# Patient Record
Sex: Female | Born: 1945
Health system: Southern US, Community
[De-identification: ages and names within clinical notes are randomized; demographics above are authoritative.]

## PROBLEM LIST (undated history)

## (undated) DIAGNOSIS — R600 Localized edema: Secondary | ICD-10-CM

## (undated) DIAGNOSIS — I10 Essential (primary) hypertension: Secondary | ICD-10-CM

## (undated) DIAGNOSIS — E785 Hyperlipidemia, unspecified: Secondary | ICD-10-CM

## (undated) DIAGNOSIS — E119 Type 2 diabetes mellitus without complications: Secondary | ICD-10-CM

## (undated) DIAGNOSIS — E0789 Other specified disorders of thyroid: Secondary | ICD-10-CM

## (undated) HISTORY — DX: Type 2 diabetes mellitus without complications: E11.9

## (undated) HISTORY — PX: ABDOMINAL HYSTERECTOMY: SHX81

## (undated) HISTORY — PX: THYROID SURGERY: SHX805

## (undated) HISTORY — DX: Other specified disorders of thyroid: E07.89

## (undated) HISTORY — DX: Hyperlipidemia, unspecified: E78.5

## (undated) HISTORY — DX: Essential (primary) hypertension: I10

## (undated) HISTORY — DX: Localized edema: R60.0

---

## 1998-12-19 ENCOUNTER — Other Ambulatory Visit: Admission: RE | Admit: 1998-12-19 | Discharge: 1998-12-19 | Payer: Self-pay | Admitting: *Deleted

## 1999-08-31 ENCOUNTER — Ambulatory Visit (HOSPITAL_COMMUNITY): Admission: RE | Admit: 1999-08-31 | Discharge: 1999-08-31 | Payer: Self-pay | Admitting: Gastroenterology

## 1999-10-02 ENCOUNTER — Ambulatory Visit (HOSPITAL_COMMUNITY): Admission: RE | Admit: 1999-10-02 | Discharge: 1999-10-02 | Payer: Self-pay | Admitting: Gastroenterology

## 1999-10-02 ENCOUNTER — Encounter: Payer: Self-pay | Admitting: Gastroenterology

## 2000-02-23 ENCOUNTER — Other Ambulatory Visit: Admission: RE | Admit: 2000-02-23 | Discharge: 2000-02-23 | Payer: Self-pay | Admitting: *Deleted

## 2001-02-27 ENCOUNTER — Other Ambulatory Visit: Admission: RE | Admit: 2001-02-27 | Discharge: 2001-02-27 | Payer: Self-pay | Admitting: *Deleted

## 2002-03-02 ENCOUNTER — Other Ambulatory Visit: Admission: RE | Admit: 2002-03-02 | Discharge: 2002-03-02 | Payer: Self-pay | Admitting: *Deleted

## 2002-05-15 ENCOUNTER — Ambulatory Visit (HOSPITAL_BASED_OUTPATIENT_CLINIC_OR_DEPARTMENT_OTHER): Admission: RE | Admit: 2002-05-15 | Discharge: 2002-05-15 | Payer: Self-pay | Admitting: Orthopedic Surgery

## 2003-01-12 ENCOUNTER — Emergency Department (HOSPITAL_COMMUNITY): Admission: EM | Admit: 2003-01-12 | Discharge: 2003-01-12 | Payer: Self-pay | Admitting: Emergency Medicine

## 2003-01-12 ENCOUNTER — Encounter: Payer: Self-pay | Admitting: Emergency Medicine

## 2003-03-24 ENCOUNTER — Other Ambulatory Visit: Admission: RE | Admit: 2003-03-24 | Discharge: 2003-03-24 | Payer: Self-pay | Admitting: *Deleted

## 2003-04-01 ENCOUNTER — Encounter: Payer: Self-pay | Admitting: Emergency Medicine

## 2003-04-02 ENCOUNTER — Encounter: Payer: Self-pay | Admitting: Internal Medicine

## 2003-04-02 ENCOUNTER — Inpatient Hospital Stay (HOSPITAL_COMMUNITY): Admission: EM | Admit: 2003-04-02 | Discharge: 2003-04-04 | Payer: Self-pay | Admitting: Emergency Medicine

## 2003-04-28 ENCOUNTER — Ambulatory Visit (HOSPITAL_COMMUNITY): Admission: RE | Admit: 2003-04-28 | Discharge: 2003-04-28 | Payer: Self-pay | Admitting: Family Medicine

## 2003-04-28 ENCOUNTER — Encounter: Payer: Self-pay | Admitting: Family Medicine

## 2004-04-12 ENCOUNTER — Other Ambulatory Visit: Admission: RE | Admit: 2004-04-12 | Discharge: 2004-04-12 | Payer: Self-pay | Admitting: *Deleted

## 2005-04-17 ENCOUNTER — Other Ambulatory Visit: Admission: RE | Admit: 2005-04-17 | Discharge: 2005-04-17 | Payer: Self-pay | Admitting: *Deleted

## 2006-04-19 ENCOUNTER — Other Ambulatory Visit: Admission: RE | Admit: 2006-04-19 | Discharge: 2006-04-19 | Payer: Self-pay | Admitting: Obstetrics and Gynecology

## 2007-04-22 ENCOUNTER — Other Ambulatory Visit: Admission: RE | Admit: 2007-04-22 | Discharge: 2007-04-22 | Payer: Self-pay | Admitting: Obstetrics and Gynecology

## 2007-09-23 ENCOUNTER — Encounter: Payer: Self-pay | Admitting: Obstetrics and Gynecology

## 2007-09-23 ENCOUNTER — Ambulatory Visit (HOSPITAL_COMMUNITY): Admission: RE | Admit: 2007-09-23 | Discharge: 2007-09-24 | Payer: Self-pay | Admitting: Obstetrics and Gynecology

## 2008-07-09 ENCOUNTER — Emergency Department (HOSPITAL_COMMUNITY): Admission: EM | Admit: 2008-07-09 | Discharge: 2008-07-09 | Payer: Self-pay | Admitting: Emergency Medicine

## 2011-01-23 NOTE — Op Note (Signed)
Samantha Lozano, VARAS              ACCOUNT NO.:  [id number]   MEDICAL RECORD NO.:  [id number]          PATIENT TYPE:  OIB   LOCATION:  1530                         FACILITY:  [facility]   PHYSICIAN:  Cynthia P. Romine, M.D.DATE OF BIRTH:  [birth year]   DATE OF PROCEDURE:  09/23/2007  DATE OF DISCHARGE:  09/24/2007                               OPERATIVE REPORT   PREOPERATIVE DIAGNOSIS:  Menorrhagia with recurrent endometrial simple  hyperplasia without atypia and known intrauterine polyp.   POSTOPERATIVE DIAGNOSIS:  Menorrhagia with recurrent endometrial simple  hyperplasia without atypia and known intrauterine polyp, pathology  pending.   PROCEDURE:  Laparoscopic total abdominal hysterectomy and bilateral  salpingo-oophorectomy with da Vinci robotic assistance   SURGEON:  Cynthia P. Romine, M.D.   ASSISTANT:  Genia Del, M.D.   ANESTHESIA:  General endotracheal.   ESTIMATED BLOOD LOSS:  Minimal.   COMPLICATIONS:  None.   PROCEDURE:  The patient was taken to the operating room and after the  induction of adequate general anesthesia, she was placed in the dorsal  lithotomy position and prepped and draped in the usual fashion.  A  posterior weighted and anterior Sims retractor were placed.  The cervix  was grasped on its anterior lip with a single-tooth tenaculum.  The  uterus sounded to 7 cm.  A 6-cm RUMI uterine manipulator was used and a  co-ring was placed around it and addition to the vaginal occluder, and  this was placed inside the uterine cavity.  The balloon of the  manipulator was then inflated.  Attention was then turned to the  abdomen.  An incision was made just above the umbilicus of approximately  3 cm and was carried down to the fascia.  The fascia was grasped with  Kochers and entered atraumatically.  The underlying peritoneum was  entered atraumatically.  A pursestring suture of 0 Vicryl was placed  around the fascial opening and a Hasson trocar sleeve was  then inserted  into the peritoneal space.  Pneumoperitoneum was created with 2 L of CO2  using the automatic insufflator.  The additional ports were placed  approximately the width of a hand away from the umbilical port on both  the patient's right and the left, slightly lower than the umbilical  port, and then the third and the fourth ports were placed, again about  the width of a hand away from the second port at about the level of the  umbilicus.  Three of these ports were 8 mm, the assistance port on the  patient's right was a 10 mm.  These were inserted under direct  visualization with the laparoscope.  The robot was then docked to the  port in the usual fashion.  The operator then proceeded to the [employer]  console.  In the right hand was the monopolar scissors, in the left hand  was the bipolar fenestrated cautery.  The ureters were identified on  each side.  The infundibulopelvic ligaments on each side were cauterized  and cut with the monopolar scissors.  The anterior leaf of the broad  ligament was then taken down  sharply.  The broad ligament was cauterized  and tied sequentially down to the uterine arteries, which were  skeletonized on each side.  The uterine arteries were cauterized and  then cut on each side.  The co-ring was then seen at the bulge through  the vaginal mucosa and the monopolar scissors were used to cut down onto  the co-ring after being sure that the bladder was well away from the  colpotomy incision.  The scissors were used to make the colpotomy  incision anterior and posteriorly.  Bipolar was used laterally to  control bleeders and the uterus was completely detached from the vagina.  The uterus was delivered into the vagina, removed, sent to pathology,  and the vaginal balloon was insufflated to maintain the  pneumoperitoneum.  The vaginal cuff was then closed with four figure-of-  eight sutures of 0 Vicryl.  Copious irrigation was carried out and the   wound was felt to be hemostatic, as were all the pedicles.  The ureters  were inspected and felt to be free of bleeding.  The robot was then  undocked, the gas was allowed to escape and the abdomen was inspected  for bleeding when the pressure was decreased, and there it was felt to  be hemostatic.  The ports were removed under direct visualization and  then the laparoscope was removed.  The umbilical incision had its fascia  closed by tying the previously-placed pursestring suture.  The other  ports had a subcutaneous stitch placed and then the incisions were  closed with subcuticular 4-0 Vicryl with Dermabond.  The instruments  were removed from vagina and the procedure was terminated.  The patient  tolerated it well and went in satisfactory condition to post anesthesia  recovery.  Sponge, needle and instrument counts were correct x3.      Cynthia P. Romine, M.D.  Electronically Signed     CPR/MEDQ  D:  09/26/2007  T:  09/27/2007  Job:  [id number]

## 2011-01-26 NOTE — Op Note (Addendum)
NAME:  Samantha Lozano, Samantha Lozano                        ACCOUNT NO.:  [id number]   MEDICAL RECORD NO.:  [id number]                   PATIENT TYPE:  AMB   LOCATION:  DSC                                  FACILITY:  [facility]   PHYSICIAN:  Harvie Junior, M.D.                DATE OF BIRTH:  [birth year]   DATE OF PROCEDURE:  DATE OF DISCHARGE:                                 OPERATIVE REPORT   PREOPERATIVE DIAGNOSIS:  Medial meniscal tear.   POSTOPERATIVE DIAGNOSES:  1. Same as above.  2. Grade 3 osteochondral defect in the medial femoral condyle through the     small grade 2 defect lateral femoral condyle.  3. Chondromalacia of the trochlea.   PROCEDURE:  1. Partial posterior medial meniscectomy with corresponding debridement of     the median femoral condyle.  2. Debridement of the lateral femoral condyle.  3. Debridement of the patellofemoral trochlea.   SURGEON:  Dr. Luiz Blare.   ASSISTANT:  ____ QA MARKER: 46 ____.        ANESTHESIA:  General.   BRIEF HISTORY AND PHYSICAL:  She is 56 with a long history of having had a  painful knee.  She ultimately had been seen by an outside physician who had  injected her knee.  She had some relief but not complete.  She presented to  [name] with a 30-degree flexion contracture and essentially a locked knee with  severe pain.  Because of the amount of pain she was having and the fact that  she had already had conservative care with failure, we discussed treatment  options and ultimately felt that operative arthroscopy is going to be the  most preferable course of action.  She was brought to the operating room for  this.   PROCEDURE:  The patient was brought to the operating room.  After adequate  anesthesia was obtained, the patient was placed on the operating table.  The  left lateral knee was prepped and draped in the appropriate fashion.  ____                                                          QA MARKER: 80____ examination of the knee  revealed there was an obvious    defect in the medial femoral condyle.  This was a grade 3 defect.  It was  covering about 3 sq cm.  This was debrided back to a smooth and stable rim.  Attention was turned to the medial meniscus.  There was small tear in the  posterior horn of the medial meniscus, this was debrided back to a smooth  and stable rim.  Attention was then turned to the lateral side which was  noted to have  a small, less than 1 cm area of grade 2 change.  This was  debrided with a suction shaver.  Attention was turned to the patellofemoral  joint, where there was some grade 2 and 3 change in the trochlea.  This was  debrided with a suction shaver back to a smooth and stable rim.  Attention  was then turned back to the medial side to make sure that there was no other  loose and fragmented pieces of the articular cartilage.  The knee was  copiously irrigated and suctioned dry.  The arthroscopy portals were closed  with bandages.  A sterile compressive dressing was applied.  The patient was  then taken to the recovery room in satisfactory condition.  Estimated blood  loss for the procedure was none.                                               Harvie Junior, M.D.    Ranae Plumber  D:  05/15/2002  T:  05/17/2002  Job:  [id number]   cc:   Harvie Junior, M.D.  [address]  [place]  [place] [address]  Fax: [phone]

## 2011-01-26 NOTE — Consult Note (Signed)
NAME:  Samantha Lozano, Samantha Lozano                        ACCOUNT NO.:  [id number]   MEDICAL RECORD NO.:  [id number]                   PATIENT TYPE:  INP   LOCATION:  3727                                 FACILITY:  [facility]   PHYSICIAN:  Lesleigh Noe, M.D.            DATE OF BIRTH:  04-02-1946   DATE OF CONSULTATION:  04/02/2003  DATE OF DISCHARGE:                                   CONSULTATION   REFERRING PHYSICIAN:  Dr Lilyan Punt. [place].   REASON FOR EVALUATION:  Loss of consciousness.   CONCLUSIONS:  1. Loss of consciousness, etiology uncertain.  There were no cardiac     abnormalities identified, even though the patient was unconscious for     quite some time.  There was some mention of possible anisocoria but this     cannot be confirmed.  The patient's blood pressure and heart rate, if     anything, were somewhat elevated and no arrhythmias or bradycardia were     identified.  2. Possible hypertension.  3. History of hypothyroidism.   RECOMMENDATION:  1. No further specific cardiac evaluation.  Serial enzymes have already been     done and are negative.  2. Serial EKGs have not demonstrated any significant change that would     explain an episode of loss of consciousness or syncope.  She has     developed an incomplete right bundle and has first-degree A-V block, but     again, no hemodynamically compromising arrhythmias were documented at the     time that the patient was unconscious.  3. Obesity could be a clue towards the possibility of pulmonary embolus,     although absence of respiratory distress makes this seem less likely, but     consideration of a spiral CT to rule out pulmonary emboli would be a     consideration, although I think a far stretch.   COMMENTS:  The patient was admitted to the hospital after falling  unconscious at home.  This apparently was witnessed by her husband.  She  initially felt somewhat weak and then noted that there may have been dimming  of  vision, when she communicated to her husband that she felt she needed a  wet rag.  When he got back with the rag, he noted that she was starting to  fall and he helped her to the floor.  She then apparently remained  unconscious for greater than an hour and was even unconscious after EMS  arrived.  All vital signs recorded by EMS were with an adequate heart rate  and initially extremely high systolic and diastolic blood pressures above  200 and above 100, respectively.  Apparently, there was, as mentioned  before, some anisocoria.  In the emergency room, the patient was  unresponsive but had vital signs that supported adequate hemodynamics.  She  apparently only takes Synthroid.  She has a history  of fainting during her  younger years when she would have her menstrual cycles.  She has no history  of heart disease.  She is not diabetic.   ONLY SIGNIFICANT MEDICAL PROBLEMS:  1. Hypothyroidism.  2. Sliding hiatal hernia.  3. Possible hypertension.   HABITS:  Does not smoke or drink.   FAMILY HISTORY:  One brother died suddenly with a myocardial infarction.   PHYSICAL EXAMINATION:  GENERAL:  The patient is obese.  VITAL SIGNS:  Blood pressure 150/70.  Heart rate 96.  O2 saturation on room  air is 95%.  NECK:  Neck exam reveals no JVD, no carotid bruits.  LUNGS:  Lungs are clear to auscultation and percussion.  CARDIAC:  No click.  No rub.  A faint 1/6 systolic murmur is heard.  ABDOMEN:  The abdomen is soft.  Liver edge is not palpable.  Bowel sounds  are normal.  EXTREMITIES:  The extremities reveal no edema.  Pulses are 2+ and symmetric  in the upper and lower extremities.  NEUROLOGIC:  Exam reveals no motor or sensory deficits.   LABORATORY AND ACCESSORY CLINICAL DATA:  EKG has already been outlined above  or shows specifically no acute change.  The second EKG reveals incomplete  right bundle branch block and poor R wave progression.   Laboratory data included three sets of  cardiac enzymes that were negative.   DISCUSSION:  This is a very odd situation.  I do not think that a cardiac  arrhythmia or cardiac event is the source.  Reasonable evaluation has  already been done.  I wonder if this could potentially be neurologic, even  though the CT scan was normal.  Would consider getting a neurology  consultation.  I do not feel any further cardiac evaluation is necessary.                                               Lesleigh Noe, M.D.    HWS/MEDQ  D:  04/02/2003  T:  04/03/2003  Job:  [id number]   cc:   [facility]

## 2011-01-26 NOTE — Discharge Summary (Signed)
NAME:  Samantha Lozano, Samantha Lozano                        ACCOUNT NO.:  [id number]   MEDICAL RECORD NO.:  [id number]                   PATIENT TYPE:  INP   LOCATION:  3727                                 FACILITY:  [facility]   PHYSICIAN:  Lilyan Punt. Sydnee Levans, M.D.             DATE OF BIRTH:  Oct 14, 1945   DATE OF ADMISSION:  04/01/2003  DATE OF DISCHARGE:  04/04/2003                                 DISCHARGE SUMMARY   DIAGNOSES:  1. Paroxysmal loss of consciousness.  2. Hypertensive.  3. Hypothyroidism status post thyroidectomy for cancer in the 80s.  4. Dysfunctional uterine bleeding, scheduled for biopsy by Dr. Clelia Croft.  5. Atherosclerotic cerebrovascular disease, hypertensive related small     vessel disease.   MEDICATIONS:  1. Hydrochlorothiazide 25 mg once daily.  2. Aspirin 325 mg daily.  3. Synthroid 150 mcg daily.  4. She was informed to refrain from restarting Premarin which had been begun     eight days prior to admission.   STUDIES AND FINDINGS:  1. EEG, negative for seizures.  2. Brain MRI, no findings of small vessel CVD.  3. Negative serial cardiac enzymes for acute ischemia.  4. ECG revealed intermittent right bundle branch block.   CONSULTATIONS:  1. Dr. Verdis Prime cardiology April 02, 2003-- the cardiologist did not feel     that a cardiac arrhythmia or a cardiac event was the source.  Speculated     a possible neurologic event, despite normal CT scan.  He recommended     neurology consultation and did not feel that any further cardiac     evaluation would be necessary.  2. Dr. Marcelino Freestone, neurology April 03, 2003--it was recommended the     patient have a TIA workup with MR angiogram of the neck and the arch to     the inferior vessels to rule out vertebral origin stenosis, vertebral     basilar insufficiency, and an echocardiogram to rule out ________.  These     could be done as an outpatient.  She advised the patient not to drive for     three months.  It was  recommended aspirin 325 mg daily.  Impression:     syncope of uncertain etiology with evaluation of MRI and MRA and EEG     yielding to no etiology.  Normal findings at the time of Dr. Bethann Goo     exam.   HISTORY OF PRESENT ILLNESS:  A 65 year old woman with a history of  hypertension, who had been off her medications for about one week.  She was  brought to the ED by the paramedics for a syncopal spell at home that lasted  close to 40 minutes.  He had a Glasgow coma scale of 3 and while in the ED  the staff was attempting to intubate, the patient awakened, was alert and  oriented x3, and not post ictal.  Her admission workup by  Dr. Renford Dills  and was to further assess her for syncope.  It was also felt the  hypertension, being uncontrolled, may have contributed.   HOSPITAL COURSE:  Various studies were conducted.  Cardiac enzymes were  negative.  EKG negative.  MRA, MRI were negative.  Blood pressure gradually  returned to acceptable normotensive levels, having restarted her HCTZ.  The  cardiologist did not do additional tests are warranted.  The patient was  observed and had no seizure, syncopal, or presyncopal events.  Orthostatic  vital signs were checked.  On the fourth hospital day the patient was  discharged home with plans for follow up testing as Dr. Orlin Hilding has advised;  carotid Dopplers, MRA of the neck, and a 2-D echocardiogram.  The patient  preferred to be sent home prior to concluding these studies while  hospitalized and that was felt to be appropriate.                                                Lilyan Punt Sydnee Levans, M.D.    KCS/MEDQ  D:  05/02/2003  T:  05/03/2003  Job:  [id number]   cc:   [facility]   Catherine A. Orlin Hilding, M.D.  [address]  [address]  [place]  [place] [address]  Fax: [phone]

## 2011-01-26 NOTE — Consult Note (Signed)
NAME:  Samantha Lozano, Samantha Lozano                        ACCOUNT NO.:  [id number]   MEDICAL RECORD NO.:  [id number]                   PATIENT TYPE:  INP   LOCATION:  3727                                 FACILITY:  [facility]   PHYSICIAN:  Gustavus Messing. Orlin Hilding, M.D.          DATE OF BIRTH:  1945/12/26   DATE OF CONSULTATION:  04/03/2003  DATE OF DISCHARGE:                                   CONSULTATION   CHIEF COMPLAINT:  Syncope.   HISTORY OF PRESENT ILLNESS:  Ms. Karim is a 65 year old right-handed,  white woman with a history of hypertension, obesity and hypothyroidism who  was admitted on April 01, 2003 after a syncopal episode at home.  The patient  says she was fine, in her usual state of health, going about her business  and she had a brief onset of lightheadedness.  She felt she was going to  faint, told her husband to get her a wet cloth and then just passed out on  the ground.  There was no prodrome or aura other than the slight  lightheadedness.  She had no palpitations, no odd olfactory sensations or  anything suggestive of complex partial seizure.  She was apparently  completely unresponsive when EMS arrived with quite elevated blood pressure  of 216/186 and a Glasgow Coma Scale score of 3 and intubation was attempted  for airway protection when she woke up and then was quickly oriented and  appropriate.   REVIEW OF SYSTEMS:  Negative for headache, visual changes, numbness,  weakness, incoordination, chest pain or palpitations.  She has had no  previous history of syncopal spells or anything suggestive of seizure.   PAST MEDICAL HISTORY:  1. Significant for obesity.  2. Hypertension.  3. Hypothyroidism, thyroidectomy.  4. Remote left knee surgery.   MEDICATIONS:  Hydrochlorothiazide, Lovenox, Synthroid, Tylenol and  Phenergan.   ALLERGIES:  No known drug allergies.   SOCIAL HISTORY:  She is married, works as a Arboriculturist.  Denies alcohol,  cigarette or drug use.   FAMILY  HISTORY:  Negative for seizure or stroke.   PHYSICAL EXAMINATION:  VITAL SIGNS:  Temperature is 97.5, pulse 85, blood  pressure 105/55, respirations 18, 94% sat on room air.  HEENT:  Head is normocephalic, atraumatic.  NECK:  Supple without bruits.  HEART:  Regular, rate and rhythm without murmurs.  She is obese.  NEUROLOGIC:  Mental status:  She is awake, alert and appropriate with  normal, fluent and spontaneous language.  She names, repeats and  comprehends.  I did not do a formal mini mental status exam as far as memory  features is concerned.  Cranial nerve II:  Pupils are equal and reactive.  Visual fields are full to confrontation.  III, IV and VI:  Extraocular  movements are intact without nystagmus, ophthalmoparesis or ptosis.  V:  Facial sensation is normal in all divisions bilaterally.  VII:  Facial motor  activity is intact  without weakness, droop or asymmetry.  VIII:  Hearing is  intact to voice and finger scratch.  IX,  X and XII:  Palate is symmetric  and tongue is midline.  XI:  She has a normal shoulder shrug.  On motor exam  she has normal station and gait.  She has normal bulk, tone, and strength  throughout with 5/5 strength in all four extremities with normal rapid fine  movements.  No drift or satellite.  No fasciculations, atrophy or tremor  with the exception of a very fine, very faint head titubation which may be  incipient by an essential tremor.  Deep tendon reflexes:  I could not elicit  them easily because of her body habitus.  I was able to get a right knee  jerk and a right brachioradialis but could not get other deep tendon  reflexes.  She had downgoing toes on plantar stimulation.  Coordination:  Finger-to-nose and rapid alternating movements, heel-to-shin and tandem gait  are normal.  Sensory exam is intact to light touch and pin prick.   I have reviewed the MRI scan of her brain.  I think the MRI is normal.  It  was read as atrophy and small vessel  disease.  There is minimal increase in  CSF space and rare miniscule nonspecific white matter dots in the cerebrum,  nothing acute.   MR angiogram, likewise, was read as showing some atherosclerosis but I think  it is essentially normal.   EEG is normal in the awake state.   IMPRESSION:  Syncope.  The etiology is uncertain.  By MRI, MRA and EEG there  is no evidence of seizure or transient ischemic attack and apparently by  cardiac evaluation and monitoring there is no evidence of an arrhythmia at  the present.  However, none of these possible etiologies are excluded  because of normal findings at this time.   RECOMMENDATIONS:  Would complete the TIA workup with a MR angiogram of the  neck from arch to intercranial vessels to rule out vertebral origin  stenosis, vertebrobasilar insufficiency and would get an echocardiogram to  rule out cardiac clot.  Would advise the patient not to drive for three  months pending further events as, if this was a seizure, the highest  incidents of recurrence is within the first three months.  Would defer to  the cardiologist's recommendations regarding the likelihood of recurrent  arrhythmias and their timeframe.  This workup could be done either inpatient  or outpatient.  I would at least begin her on aspirin 325 mg once a day.  I  have discussed this with the family and with Dr. Sydnee Levans.                                               Catherine A. Orlin Hilding, M.D.    CAW/MEDQ  D:  04/03/2003  T:  04/04/2003  Job:  [id number]

## 2011-05-31 LAB — COMPREHENSIVE METABOLIC PANEL
Calcium: 9.3
GFR calc Af Amer: >60
Potassium: 3.6
Total Bilirubin: 0.6

## 2011-05-31 LAB — CBC
HCT: 31.3 — ABNORMAL LOW
HCT: 34.8 — ABNORMAL LOW
Hemoglobin: 10.8 — ABNORMAL LOW
Platelets: 208
Platelets: 227
RDW: 12.8
WBC: 7.9

## 2013-06-11 ENCOUNTER — Other Ambulatory Visit (HOSPITAL_BASED_OUTPATIENT_CLINIC_OR_DEPARTMENT_OTHER): Payer: Self-pay | Admitting: Family Medicine

## 2013-06-11 DIAGNOSIS — E041 Nontoxic single thyroid nodule: Secondary | ICD-10-CM

## 2013-06-15 ENCOUNTER — Ambulatory Visit (HOSPITAL_BASED_OUTPATIENT_CLINIC_OR_DEPARTMENT_OTHER)
Admission: RE | Admit: 2013-06-15 | Discharge: 2013-06-15 | Disposition: A | Discharge status: Home / Self Care | Payer: Medicare PPO | Source: Ambulatory Visit | Attending: Family Medicine | Admitting: Family Medicine

## 2013-06-15 DIAGNOSIS — E041 Nontoxic single thyroid nodule: Secondary | ICD-10-CM | POA: Insufficient documentation

## 2013-06-17 ENCOUNTER — Other Ambulatory Visit: Payer: Self-pay | Admitting: Family Medicine

## 2013-06-17 DIAGNOSIS — E041 Nontoxic single thyroid nodule: Secondary | ICD-10-CM

## 2013-06-23 ENCOUNTER — Other Ambulatory Visit (HOSPITAL_COMMUNITY)
Admission: RE | Admit: 2013-06-23 | Discharge: 2013-06-23 | Disposition: A | Discharge status: Home / Self Care | Payer: Medicare PPO | Source: Ambulatory Visit | Attending: Interventional Radiology | Admitting: Interventional Radiology

## 2013-06-23 ENCOUNTER — Ambulatory Visit
Admission: RE | Admit: 2013-06-23 | Discharge: 2013-06-23 | Disposition: A | Discharge status: Home / Self Care | Payer: Medicare PPO | Source: Ambulatory Visit | Attending: Family Medicine | Admitting: Family Medicine

## 2013-06-23 DIAGNOSIS — E041 Nontoxic single thyroid nodule: Secondary | ICD-10-CM

## 2016-01-18 DIAGNOSIS — M25562 Pain in left knee: Secondary | ICD-10-CM | POA: Diagnosis not present

## 2016-01-18 DIAGNOSIS — E039 Hypothyroidism, unspecified: Secondary | ICD-10-CM | POA: Diagnosis not present

## 2016-01-18 DIAGNOSIS — Z0000 Encounter for general adult medical examination without abnormal findings: Secondary | ICD-10-CM | POA: Diagnosis not present

## 2016-01-18 DIAGNOSIS — E669 Obesity, unspecified: Secondary | ICD-10-CM | POA: Diagnosis not present

## 2016-01-18 DIAGNOSIS — E119 Type 2 diabetes mellitus without complications: Secondary | ICD-10-CM | POA: Diagnosis not present

## 2016-01-18 DIAGNOSIS — I10 Essential (primary) hypertension: Secondary | ICD-10-CM | POA: Diagnosis not present

## 2016-01-18 DIAGNOSIS — E782 Mixed hyperlipidemia: Secondary | ICD-10-CM | POA: Diagnosis not present

## 2016-03-07 DIAGNOSIS — L821 Other seborrheic keratosis: Secondary | ICD-10-CM | POA: Diagnosis not present

## 2016-03-07 DIAGNOSIS — D225 Melanocytic nevi of trunk: Secondary | ICD-10-CM | POA: Diagnosis not present

## 2016-03-07 DIAGNOSIS — L570 Actinic keratosis: Secondary | ICD-10-CM | POA: Diagnosis not present

## 2016-03-07 DIAGNOSIS — D1801 Hemangioma of skin and subcutaneous tissue: Secondary | ICD-10-CM | POA: Diagnosis not present

## 2016-03-07 DIAGNOSIS — D485 Neoplasm of uncertain behavior of skin: Secondary | ICD-10-CM | POA: Diagnosis not present

## 2016-04-25 DIAGNOSIS — M25562 Pain in left knee: Secondary | ICD-10-CM | POA: Diagnosis not present

## 2016-04-25 DIAGNOSIS — M25561 Pain in right knee: Secondary | ICD-10-CM | POA: Diagnosis not present

## 2016-04-27 DIAGNOSIS — Z1231 Encounter for screening mammogram for malignant neoplasm of breast: Secondary | ICD-10-CM | POA: Diagnosis not present

## 2016-06-06 DIAGNOSIS — H25813 Combined forms of age-related cataract, bilateral: Secondary | ICD-10-CM | POA: Diagnosis not present

## 2016-06-06 DIAGNOSIS — E119 Type 2 diabetes mellitus without complications: Secondary | ICD-10-CM | POA: Diagnosis not present

## 2016-06-14 DIAGNOSIS — Z23 Encounter for immunization: Secondary | ICD-10-CM | POA: Diagnosis not present

## 2016-07-20 DIAGNOSIS — E039 Hypothyroidism, unspecified: Secondary | ICD-10-CM | POA: Diagnosis not present

## 2016-07-20 DIAGNOSIS — Z0000 Encounter for general adult medical examination without abnormal findings: Secondary | ICD-10-CM | POA: Diagnosis not present

## 2016-07-20 DIAGNOSIS — E669 Obesity, unspecified: Secondary | ICD-10-CM | POA: Diagnosis not present

## 2016-07-20 DIAGNOSIS — I10 Essential (primary) hypertension: Secondary | ICD-10-CM | POA: Diagnosis not present

## 2016-07-20 DIAGNOSIS — E119 Type 2 diabetes mellitus without complications: Secondary | ICD-10-CM | POA: Diagnosis not present

## 2016-07-20 DIAGNOSIS — E782 Mixed hyperlipidemia: Secondary | ICD-10-CM | POA: Diagnosis not present

## 2016-07-20 DIAGNOSIS — M25562 Pain in left knee: Secondary | ICD-10-CM | POA: Diagnosis not present

## 2016-07-25 DIAGNOSIS — E782 Mixed hyperlipidemia: Secondary | ICD-10-CM | POA: Diagnosis not present

## 2016-07-25 DIAGNOSIS — Z23 Encounter for immunization: Secondary | ICD-10-CM | POA: Diagnosis not present

## 2016-07-25 DIAGNOSIS — I10 Essential (primary) hypertension: Secondary | ICD-10-CM | POA: Diagnosis not present

## 2016-07-25 DIAGNOSIS — E669 Obesity, unspecified: Secondary | ICD-10-CM | POA: Diagnosis not present

## 2016-07-25 DIAGNOSIS — E039 Hypothyroidism, unspecified: Secondary | ICD-10-CM | POA: Diagnosis not present

## 2016-07-25 DIAGNOSIS — Z7984 Long term (current) use of oral hypoglycemic drugs: Secondary | ICD-10-CM | POA: Diagnosis not present

## 2016-07-25 DIAGNOSIS — E119 Type 2 diabetes mellitus without complications: Secondary | ICD-10-CM | POA: Diagnosis not present

## 2016-11-22 DIAGNOSIS — L570 Actinic keratosis: Secondary | ICD-10-CM | POA: Diagnosis not present

## 2016-11-22 DIAGNOSIS — D225 Melanocytic nevi of trunk: Secondary | ICD-10-CM | POA: Diagnosis not present

## 2016-11-22 DIAGNOSIS — D485 Neoplasm of uncertain behavior of skin: Secondary | ICD-10-CM | POA: Diagnosis not present

## 2017-01-03 DIAGNOSIS — L820 Inflamed seborrheic keratosis: Secondary | ICD-10-CM | POA: Diagnosis not present

## 2017-01-03 DIAGNOSIS — L720 Epidermal cyst: Secondary | ICD-10-CM | POA: Diagnosis not present

## 2017-01-03 DIAGNOSIS — L570 Actinic keratosis: Secondary | ICD-10-CM | POA: Diagnosis not present

## 2017-01-23 DIAGNOSIS — I10 Essential (primary) hypertension: Secondary | ICD-10-CM | POA: Diagnosis not present

## 2017-01-23 DIAGNOSIS — E039 Hypothyroidism, unspecified: Secondary | ICD-10-CM | POA: Diagnosis not present

## 2017-01-23 DIAGNOSIS — Z0000 Encounter for general adult medical examination without abnormal findings: Secondary | ICD-10-CM | POA: Diagnosis not present

## 2017-01-23 DIAGNOSIS — E782 Mixed hyperlipidemia: Secondary | ICD-10-CM | POA: Diagnosis not present

## 2017-01-23 DIAGNOSIS — Z6841 Body Mass Index (BMI) 40.0 and over, adult: Secondary | ICD-10-CM | POA: Diagnosis not present

## 2017-01-23 DIAGNOSIS — Z7984 Long term (current) use of oral hypoglycemic drugs: Secondary | ICD-10-CM | POA: Diagnosis not present

## 2017-01-23 DIAGNOSIS — E119 Type 2 diabetes mellitus without complications: Secondary | ICD-10-CM | POA: Diagnosis not present

## 2017-01-23 DIAGNOSIS — Z1159 Encounter for screening for other viral diseases: Secondary | ICD-10-CM | POA: Diagnosis not present

## 2017-04-29 DIAGNOSIS — Z1231 Encounter for screening mammogram for malignant neoplasm of breast: Secondary | ICD-10-CM | POA: Diagnosis not present

## 2017-06-17 DIAGNOSIS — Z23 Encounter for immunization: Secondary | ICD-10-CM | POA: Diagnosis not present

## 2017-07-02 DIAGNOSIS — R6889 Other general symptoms and signs: Secondary | ICD-10-CM | POA: Diagnosis not present

## 2017-07-02 DIAGNOSIS — J0110 Acute frontal sinusitis, unspecified: Secondary | ICD-10-CM | POA: Diagnosis not present

## 2017-07-30 DIAGNOSIS — E039 Hypothyroidism, unspecified: Secondary | ICD-10-CM | POA: Diagnosis not present

## 2017-07-30 DIAGNOSIS — I10 Essential (primary) hypertension: Secondary | ICD-10-CM | POA: Diagnosis not present

## 2017-07-30 DIAGNOSIS — E782 Mixed hyperlipidemia: Secondary | ICD-10-CM | POA: Diagnosis not present

## 2017-07-30 DIAGNOSIS — E119 Type 2 diabetes mellitus without complications: Secondary | ICD-10-CM | POA: Diagnosis not present

## 2017-11-27 DIAGNOSIS — L720 Epidermal cyst: Secondary | ICD-10-CM | POA: Diagnosis not present

## 2017-11-27 DIAGNOSIS — L570 Actinic keratosis: Secondary | ICD-10-CM | POA: Diagnosis not present

## 2017-12-16 DIAGNOSIS — L723 Sebaceous cyst: Secondary | ICD-10-CM | POA: Diagnosis not present

## 2017-12-16 DIAGNOSIS — J069 Acute upper respiratory infection, unspecified: Secondary | ICD-10-CM | POA: Diagnosis not present

## 2017-12-16 DIAGNOSIS — R05 Cough: Secondary | ICD-10-CM | POA: Diagnosis not present

## 2017-12-16 DIAGNOSIS — L089 Local infection of the skin and subcutaneous tissue, unspecified: Secondary | ICD-10-CM | POA: Diagnosis not present

## 2017-12-17 DIAGNOSIS — J22 Unspecified acute lower respiratory infection: Secondary | ICD-10-CM | POA: Diagnosis not present

## 2017-12-17 DIAGNOSIS — L02213 Cutaneous abscess of chest wall: Secondary | ICD-10-CM | POA: Diagnosis not present

## 2017-12-17 DIAGNOSIS — R0602 Shortness of breath: Secondary | ICD-10-CM | POA: Diagnosis not present

## 2017-12-19 DIAGNOSIS — J22 Unspecified acute lower respiratory infection: Secondary | ICD-10-CM | POA: Diagnosis not present

## 2017-12-20 DIAGNOSIS — L720 Epidermal cyst: Secondary | ICD-10-CM | POA: Diagnosis not present

## 2018-01-01 DIAGNOSIS — L720 Epidermal cyst: Secondary | ICD-10-CM | POA: Diagnosis not present

## 2018-01-14 DIAGNOSIS — L720 Epidermal cyst: Secondary | ICD-10-CM | POA: Diagnosis not present

## 2018-01-28 DIAGNOSIS — R05 Cough: Secondary | ICD-10-CM | POA: Diagnosis not present

## 2018-01-28 DIAGNOSIS — E1159 Type 2 diabetes mellitus with other circulatory complications: Secondary | ICD-10-CM | POA: Diagnosis not present

## 2018-01-28 DIAGNOSIS — E782 Mixed hyperlipidemia: Secondary | ICD-10-CM | POA: Diagnosis not present

## 2018-01-28 DIAGNOSIS — E039 Hypothyroidism, unspecified: Secondary | ICD-10-CM | POA: Diagnosis not present

## 2018-01-28 DIAGNOSIS — I10 Essential (primary) hypertension: Secondary | ICD-10-CM | POA: Diagnosis not present

## 2018-02-25 DIAGNOSIS — B029 Zoster without complications: Secondary | ICD-10-CM | POA: Diagnosis not present

## 2018-03-06 ENCOUNTER — Encounter (INDEPENDENT_AMBULATORY_CARE_PROVIDER_SITE_OTHER): Payer: Medicare PPO

## 2018-03-10 ENCOUNTER — Encounter (INDEPENDENT_AMBULATORY_CARE_PROVIDER_SITE_OTHER): Payer: Self-pay | Admitting: Family Medicine

## 2018-03-10 ENCOUNTER — Ambulatory Visit (INDEPENDENT_AMBULATORY_CARE_PROVIDER_SITE_OTHER): Payer: PPO | Admitting: Family Medicine

## 2018-03-10 VITALS — BP 146/79 | HR 62 | Temp 98.1°F | Ht 62.0 in | Wt 237.0 lb

## 2018-03-10 DIAGNOSIS — Z6841 Body Mass Index (BMI) 40.0 and over, adult: Secondary | ICD-10-CM

## 2018-03-10 DIAGNOSIS — Z1331 Encounter for screening for depression: Secondary | ICD-10-CM

## 2018-03-10 DIAGNOSIS — E538 Deficiency of other specified B group vitamins: Secondary | ICD-10-CM | POA: Diagnosis not present

## 2018-03-10 DIAGNOSIS — E038 Other specified hypothyroidism: Secondary | ICD-10-CM | POA: Diagnosis not present

## 2018-03-10 DIAGNOSIS — E559 Vitamin D deficiency, unspecified: Secondary | ICD-10-CM | POA: Insufficient documentation

## 2018-03-10 DIAGNOSIS — R0602 Shortness of breath: Secondary | ICD-10-CM

## 2018-03-10 DIAGNOSIS — R5383 Other fatigue: Secondary | ICD-10-CM | POA: Diagnosis not present

## 2018-03-10 DIAGNOSIS — E119 Type 2 diabetes mellitus without complications: Secondary | ICD-10-CM | POA: Diagnosis not present

## 2018-03-10 DIAGNOSIS — Z0289 Encounter for other administrative examinations: Secondary | ICD-10-CM

## 2018-03-10 DIAGNOSIS — R9431 Abnormal electrocardiogram [ECG] [EKG]: Secondary | ICD-10-CM

## 2018-03-10 DIAGNOSIS — Z794 Long term (current) use of insulin: Secondary | ICD-10-CM | POA: Insufficient documentation

## 2018-03-10 MED ORDER — GLUCOSE BLOOD VI STRP: ORAL_STRIP | 0 refills | Status: DC

## 2018-03-10 NOTE — Progress Notes (Signed)
.  Office: [phone]  /  Fax: [phone]   HPI:   Chief Complaint: OBESITY  Samantha Lozano (MR# [id number]) is a 72 y.o. female who presents on 03/10/2018 for obesity evaluation and treatment. Current BMI is Body mass index is 43.35 kg/m.Samantha Lozano has struggled with obesity for years and has been unsuccessful in either losing weight or maintaining long term weight loss. [name] attended our information session and states she is currently in the action stage of change and ready to dedicate time achieving and maintaining a healthier weight.  [name] states her desired weight is under 200 lbs she started gaining weight after having a baby she has significant food cravings issues  she skips meals frequently she is frequently drinking liquids with calories she frequently makes poor food choices she frequently eats larger portions than normal  she has binge eating behaviors she struggles with emotional eating    Fatigue Samantha Lozano feels her energy is lower than it should be. This has worsened with weight gain and has not worsened recently. [name] admits to daytime somnolence and  denies waking up still tired. Patient is at risk for obstructive sleep apnea. Patent has a history of symptoms of daytime fatigue. Patient generally gets 8 hours of sleep per night, and states they generally have  restful sleep. Snoring is present. Apneic episodes are not present. Epworth Sleepiness Score is 8  Dyspnea on exertion Samantha Lozano notes increasing shortness of breath with exercising and seems to be worsening over time with weight gain. She notes getting out of breath sooner with activity than she used to. This has not gotten worse recently. Samantha Lozano denies orthopnea.  Diabetes II [name] has a diagnosis of diabetes type II. Samantha Lozano checks blood sugar occasionally and states BGs ranged between 158 and 226 in the last month. She is on metformin and she wants to change her diet to avoid having to start insulin. She  admits polyphagia and denies hypoglycemia. She is attempting to work on intensive lifestyle modifications including diet, exercise, and weight loss to help control her blood glucose levels.  Abnormal EKG EKG was ordered today and is showing LVH. Samantha Lozano admits shortness of breath with activity. She has a diagnosis of hypertension and she has uncontrolled diabetes. Samantha Lozano denies chest pain.  Vitamin D deficiency Samantha Lozano has a diagnosis of vitamin D deficiency. There are no recent labs in Epic. She is not currently taking vit D. Samantha Lozano admits fatigue and denies nausea, vomiting or muscle weakness.  Vitamin B12 deficiency Samantha Lozano is on metformin, so she is at high risk of B12 insufficiency. This is a new diagnosis. [name] is not a vegetarian and does not have a previous diagnosis of pernicious anemia. She does not have a history of weight loss surgery.   Hypothyroid Samantha Lozano has a diagnosis of hypothyroidism. She is status post resection due to thyroid cancer. She is on levothyroxine. She denies hot or cold intolerance or palpitations, but does admit to ongoing fatigue.  Depression Screen Samantha Lozano's Food and Mood (modified PHQ-9) score was  Depression screen PHQ 2/9 03/10/2018  Decreased Interest 0  Down, Depressed, Hopeless 1  PHQ - 2 Score 1  Altered sleeping 0  Tired, decreased energy 1  Change in appetite 0  Feeling bad or failure about yourself  1  Trouble concentrating 0  Moving slowly or fidgety/restless 0  Suicidal thoughts 0  PHQ-9 Score 3  Difficult doing work/chores Not difficult at all    ALLERGIES: Allergies  Allergen Reactions  . Lisinopril  Cough    MEDICATIONS: Current Outpatient Medications on File Prior to Visit  Medication Sig Dispense Refill  . aspirin EC 81 MG tablet Take 81 mg by mouth daily.    . carvedilol (COREG) 25 MG tablet Take 25 mg by mouth 2 (two) times daily with a meal.    . hydrochlorothiazide (HYDRODIURIL) 25 MG tablet Take 25 mg by mouth daily.    Samantha Kitchen  levothyroxine (SYNTHROID, LEVOTHROID) 125 MCG tablet Take 125 mcg by mouth daily before breakfast.    . lovastatin (MEVACOR) 40 MG tablet Take 40 mg by mouth at bedtime.    . metFORMIN (GLUCOPHAGE-XR) 750 MG 24 hr tablet Take 750 mg by mouth daily with breakfast.    . Omega-3 Fatty Acids (FISH OIL) 435 MG CAPS Take 1 capsule by mouth daily at 2 PM.     No current facility-administered medications on file prior to visit.     PAST MEDICAL HISTORY: Past Medical History:  Diagnosis Date  . Diabetes mellitus, type 2 ([place])   . HLD (hyperlipidemia)   . HTN (hypertension)   . Leg edema   . Other specified disorders of thyroid     PAST SURGICAL HISTORY: Past Surgical History:  Procedure Laterality Date  . ABDOMINAL HYSTERECTOMY    . THYROID SURGERY      SOCIAL HISTORY: Social History   Tobacco Use  . Smoking status: Never Smoker  . Smokeless tobacco: Never Used  Substance Use Topics  . Alcohol use: Not on file  . Drug use: Not on file    FAMILY HISTORY: Family History  Problem Relation Age of Onset  . Diabetes Mother   . Hypertension Mother   . Cancer Mother   . Obesity Mother   . Hypertension Father   . Cancer Father   . Obesity Father     ROS: Review of Systems  Constitutional: Positive for malaise/fatigue.  HENT: Positive for tinnitus.   Eyes:       Vision Changes Floaters  Respiratory: Positive for shortness of breath (on exertion).   Cardiovascular: Negative for chest pain, palpitations and orthopnea.  Gastrointestinal: Negative for nausea and vomiting.  Musculoskeletal:       Negative for muscle weakness  Endo/Heme/Allergies:       Negative for Heat or Cold Intolerance Positive for hyperglycemia Negative for hypoglycemia    PHYSICAL EXAM: Blood pressure (!) 146/79, pulse 62, temperature 98.1 F (36.7 C), temperature source Oral, height _0  (1.575 m), weight 237 lb (107.5 kg), SpO2 98 %. Body mass index is 43.35 kg/m. Physical Exam    Constitutional: She is oriented to person, place, and time. She appears well-developed and well-nourished.  HENT:  Head: Normocephalic and atraumatic.  Nose: Nose normal.  Eyes: EOM are normal. No scleral icterus.  Neck: Normal range of motion. Neck supple.  Cardiovascular: Normal rate and regular rhythm.  Pulmonary/Chest: Effort normal. No respiratory distress.  Abdominal: Soft. There is no tenderness.  + obesity  Musculoskeletal: Normal range of motion.  Range of Motion normal in all 4 extremities  Neurological: She is alert and oriented to person, place, and time. Coordination normal.  Skin: Skin is warm and dry.  Psychiatric: She has a normal mood and affect. Her behavior is normal.  Vitals reviewed.   RECENT LABS AND TESTS: BMET    Component Value Date/Time   NA 141 09/19/2007 1340   K 3.6 09/19/2007 1340   CL 105 09/19/2007 1340   CO2 28 09/19/2007 1340   GLUCOSE  126 (H) 09/19/2007 1340   BUN 14 09/19/2007 1340   CREATININE 0.88 09/19/2007 1340   CALCIUM 9.3 09/19/2007 1340   GFRNONAA >60 09/19/2007 1340   GFRAA  09/19/2007 1340    >60        The eGFR has been calculated using the MDRD equation. This calculation has not been validated in all clinical situations. eGFR's persistently <60 mL/min signify possible Chronic Kidney Disease.   No results found for: HGBA1C No results found for: INSULIN CBC    Component Value Date/Time   WBC 10.3 09/24/2007 0515   RBC 3.89 09/24/2007 0515   HGB 10.8 (L) 09/24/2007 0515   HCT 31.3 (L) 09/24/2007 0515   PLT 208 09/24/2007 0515   MCV 80.3 09/24/2007 0515   MCHC 34.6 09/24/2007 0515   RDW 13.0 09/24/2007 0515   Iron/TIBC/Ferritin/ %Sat No results found for: IRON, TIBC, FERRITIN, IRONPCTSAT Lipid Panel  No results found for: CHOL, TRIG, HDL, CHOLHDL, VLDL, LDLCALC, LDLDIRECT Hepatic Function Panel     Component Value Date/Time   PROT 7.0 09/19/2007 1340   ALBUMIN 3.6 09/19/2007 1340   AST 21 09/19/2007 1340    ALT 19 09/19/2007 1340   ALKPHOS 102 09/19/2007 1340   BILITOT 0.6 09/19/2007 1340   No results found for: TSH Vitamin D There are no recent lab results  ECG  shows NSR with a rate of 61 BPM INDIRECT CALORIMETER done today shows a VO2 of 156 and a REE of 1084. Her calculated basal metabolic rate is 4098 thus her basal metabolic rate is worse than expected.    ASSESSMENT AND PLAN: Other fatigue - Plan: EKG 12-Lead, CBC With Differential, Lipid Panel With LDL/HDL Ratio, T3, T4, free, TSH  Shortness of breath on exertion - Plan: CBC With Differential  Type 2 diabetes mellitus without complication, without long-term current use of insulin (HCC) - Plan: Comprehensive metabolic panel, Hemoglobin A1c, Insulin, random, Microalbumin / creatinine urine ratio, glucose blood test strip  Abnormal EKG - Plan: ECHOCARDIOGRAM COMPLETE  Vitamin D deficiency - Plan: VITAMIN D 25 Hydroxy (Vit-D Deficiency, Fractures)  B12 nutritional deficiency - Plan: Vitamin B12  Other specified hypothyroidism  Depression screening  Class 3 severe obesity with serious comorbidity and body mass index (BMI) of 40.0 to 44.9 in adult, unspecified obesity type (HCC)  PLAN:  Fatigue Mersadies was informed that her fatigue may be related to obesity, depression or many other causes. Labs will be ordered, and in the meanwhile Eh has agreed to work on diet, exercise and weight loss to help with fatigue. Proper sleep hygiene was discussed including the need for 7-8 hours of quality sleep each night. A sleep study was not ordered based on symptoms and Epworth score.  Dyspnea on exertion Bijal's shortness of breath appears to be obesity related and exercise induced. She has agreed to work on weight loss and gradually increase exercise to treat her exercise induced shortness of breath. If Ashton follows our instructions and loses weight without improvement of her shortness of breath, we will plan to refer to pulmonology.  We will monitor this condition regularly. Savannah agrees to this plan.  Diabetes II Lometa has been given extensive diabetes education by myself today including ideal fasting and post-prandial blood glucose readings, individual ideal Hgb A1c goals and hypoglycemia prevention. We discussed the importance of good blood sugar control to decrease the likelihood of diabetic complications such as nephropathy, neuropathy, limb loss, blindness, coronary artery disease, and death. We discussed the importance of intensive  lifestyle modification including diet, exercise and weight loss as the first line treatment for diabetes. We will check labs and Bertina agrees to start diet and check labs two times daily. She will continue her diabetes medications and we will refill One Touch test strips for 1 month. Swan agrees to follow up at the agreed upon time.  Abnormal EKG We will order echocardiogram ([place], no authorization required) and follow. Payslie agrees to follow up as directed.  Vitamin D Deficiency Trenese was informed that low vitamin D levels contributes to fatigue and are associated with obesity, breast, and colon cancer. We will check labs and follow. Eshika will follow up for routine testing of vitamin D, at least 2-3 times per year.  Vitamin B12 deficiency Shaddai will be on a B12 rich diet. B12 supplementation was not prescribed today. We will check labs and follow.  Hypothyroid Laurana was informed of the importance of good thyroid control to help with weight loss efforts. She was also informed that supertheraputic thyroid levels are dangerous and will not improve weight loss results. Emery will continue levothyroxine and we will check labs. Jemeka will follow up with our clinic in 2 weeks.  Depression Screen Oyinkansola had a negative depression screening. Depression is commonly associated with obesity and often results in emotional eating behaviors. We will monitor this closely and  work on CBT to help improve the non-hunger eating patterns. Referral to Psychology may be required if no improvement is seen as she continues in our clinic.  Obesity Earlisha is currently in the action stage of change and her goal is to continue with weight loss efforts She has agreed to follow the Category 1 plan Laurelin has been instructed to work up to a goal of 150 minutes of combined cardio and strengthening exercise per week for weight loss and overall health benefits. We discussed the following Behavioral Modification Strategies today: increasing lean protein intake, decreasing simple carbohydrates  and work on meal planning and easy cooking plans  Aviya has agreed to follow up with our clinic in 2 weeks. She was informed of the importance of frequent follow up visits to maximize her success with intensive lifestyle modifications for her multiple health conditions. She was informed we would discuss her lab results at her next visit unless there is a critical issue that needs to be addressed sooner. Eleonor agreed to keep her next visit at the agreed upon time to discuss these results.    OBESITY BEHAVIORAL INTERVENTION VISIT  Today's visit was # 1 out of 22.  Starting weight: 237 lbs Starting date: 03/10/18 Today's weight : 237 lbs  Today's date: 03/10/2018 Total lbs lost to date: 0 (Patients must lose 7 lbs in the first 6 months to continue with counseling)   ASK: We discussed the diagnosis of obesity with [name] today and Tala agreed to give [name] permission to discuss obesity behavioral modification therapy today.  ASSESS: Maricela has the diagnosis of obesity and her BMI today is 43.34 Naria is in the action stage of change   ADVISE: Jarrod was educated on the multiple health risks of obesity as well as the benefit of weight loss to improve her health. She was advised of the need for long term treatment and the importance of lifestyle modifications.  AGREE: Multiple  dietary modification options and treatment options were discussed and  Shyna agreed to the above obesity treatment plan.   IDoreene Nest, am acting as Location manager for [employer],  MD   I have reviewed the above documentation for accuracy and completeness, and I agree with the above. -Dennard Nip, MD

## 2018-03-11 LAB — TSH: TSH: 1.3 u[IU]/mL (ref 0.450–4.500)

## 2018-03-11 LAB — CBC WITH DIFFERENTIAL
BASOS ABS: 0 10*3/uL (ref 0.0–0.2)
Basos: 0 %
EOS (ABSOLUTE): 0.3 10*3/uL (ref 0.0–0.4)
Eos: 5 %
Hematocrit: 37.6 % (ref 34.0–46.6)
Hemoglobin: 12.1 g/dL (ref 11.1–15.9)
IMMATURE GRANULOCYTES: 0 %
Immature Grans (Abs): 0 10*3/uL (ref 0.0–0.1)
Lymphocytes Absolute: 2 10*3/uL (ref 0.7–3.1)
Lymphs: 31 %
MCH: 27.1 pg (ref 26.6–33.0)
MCHC: 32.2 g/dL (ref 31.5–35.7)
MCV: 84 fL (ref 79–97)
MONOCYTES: 5 %
MONOS ABS: 0.3 10*3/uL (ref 0.1–0.9)
NEUTROS PCT: 59 %
Neutrophils Absolute: 3.8 10*3/uL (ref 1.4–7.0)
RBC: 4.47 x10E6/uL (ref 3.77–5.28)
RDW: 14.7 % (ref 12.3–15.4)
WBC: 6.5 10*3/uL (ref 3.4–10.8)

## 2018-03-11 LAB — COMPREHENSIVE METABOLIC PANEL
ALK PHOS: 95 IU/L (ref 39–117)
ALT: 16 IU/L (ref 0–32)
AST: 15 IU/L (ref 0–40)
Albumin/Globulin Ratio: 1.7 (ref 1.2–2.2)
Albumin: 4.5 g/dL (ref 3.5–4.8)
BUN / CREAT RATIO: 18 (ref 12–28)
BUN: 12 mg/dL (ref 8–27)
Bilirubin Total: 0.5 mg/dL (ref 0.0–1.2)
CO2: 25 mmol/L (ref 20–29)
CREATININE: 0.65 mg/dL (ref 0.57–1.00)
Calcium: 9.6 mg/dL (ref 8.7–10.3)
Chloride: 101 mmol/L (ref 96–106)
GFR calc non Af Amer: 90 mL/min/{1.73_m2} (ref >59)
GFR, EST AFRICAN AMERICAN: 104 mL/min/{1.73_m2} (ref >59)
GLOBULIN, TOTAL: 2.7 g/dL (ref 1.5–4.5)
GLUCOSE: 152 mg/dL — AB (ref 65–99)
Potassium: 4.4 mmol/L (ref 3.5–5.2)
Sodium: 141 mmol/L (ref 134–144)
Total Protein: 7.2 g/dL (ref 6.0–8.5)

## 2018-03-11 LAB — T4, FREE: FREE T4: 1.78 ng/dL — AB (ref 0.82–1.77)

## 2018-03-11 LAB — LIPID PANEL WITH LDL/HDL RATIO
Cholesterol, Total: 149 mg/dL (ref 100–199)
HDL: 42 mg/dL (ref >39)
LDL Calculated: 82 mg/dL (ref 0–99)
LDl/HDL Ratio: 2 ratio (ref 0.0–3.2)
Triglycerides: 125 mg/dL (ref 0–149)
VLDL Cholesterol Cal: 25 mg/dL (ref 5–40)

## 2018-03-11 LAB — VITAMIN B12: Vitamin B-12: 293 pg/mL (ref 232–1245)

## 2018-03-11 LAB — VITAMIN D 25 HYDROXY (VIT D DEFICIENCY, FRACTURES): Vit D, 25-Hydroxy: 30.2 ng/mL (ref 30.0–100.0)

## 2018-03-11 LAB — MICROALBUMIN / CREATININE URINE RATIO
CREATININE, UR: 40.3 mg/dL
Microalb/Creat Ratio: <7.4 mg/g creat (ref 0.0–30.0)
Microalbumin, Urine: <3 ug/mL

## 2018-03-11 LAB — HEMOGLOBIN A1C
Est. average glucose Bld gHb Est-mCnc: 166 mg/dL
HEMOGLOBIN A1C: 7.4 % — AB (ref 4.8–5.6)

## 2018-03-11 LAB — INSULIN, RANDOM: INSULIN: 15 u[IU]/mL (ref 2.6–24.9)

## 2018-03-11 LAB — T3: T3 TOTAL: 115 ng/dL (ref 71–180)

## 2018-03-19 ENCOUNTER — Other Ambulatory Visit: Payer: Self-pay

## 2018-03-19 ENCOUNTER — Ambulatory Visit (HOSPITAL_COMMUNITY): Payer: PPO | Attending: Cardiology

## 2018-03-19 DIAGNOSIS — I10 Essential (primary) hypertension: Secondary | ICD-10-CM | POA: Diagnosis not present

## 2018-03-19 DIAGNOSIS — E785 Hyperlipidemia, unspecified: Secondary | ICD-10-CM | POA: Diagnosis not present

## 2018-03-19 DIAGNOSIS — Z6841 Body Mass Index (BMI) 40.0 and over, adult: Secondary | ICD-10-CM | POA: Diagnosis not present

## 2018-03-19 DIAGNOSIS — E119 Type 2 diabetes mellitus without complications: Secondary | ICD-10-CM | POA: Diagnosis not present

## 2018-03-19 DIAGNOSIS — R9431 Abnormal electrocardiogram [ECG] [EKG]: Secondary | ICD-10-CM

## 2018-03-19 DIAGNOSIS — E669 Obesity, unspecified: Secondary | ICD-10-CM | POA: Insufficient documentation

## 2018-03-19 DIAGNOSIS — R0600 Dyspnea, unspecified: Secondary | ICD-10-CM | POA: Diagnosis not present

## 2018-03-24 ENCOUNTER — Ambulatory Visit (INDEPENDENT_AMBULATORY_CARE_PROVIDER_SITE_OTHER): Payer: PPO | Admitting: Family Medicine

## 2018-03-24 VITALS — BP 135/78 | HR 66 | Temp 98.0°F | Ht 62.0 in | Wt 230.0 lb

## 2018-03-24 DIAGNOSIS — I5189 Other ill-defined heart diseases: Secondary | ICD-10-CM

## 2018-03-24 DIAGNOSIS — Z6841 Body Mass Index (BMI) 40.0 and over, adult: Secondary | ICD-10-CM

## 2018-03-24 DIAGNOSIS — E559 Vitamin D deficiency, unspecified: Secondary | ICD-10-CM

## 2018-03-24 DIAGNOSIS — I519 Heart disease, unspecified: Secondary | ICD-10-CM

## 2018-03-24 MED ORDER — VITAMIN D (ERGOCALCIFEROL) 1.25 MG (50000 UNIT) PO CAPS: 50000.0000 [IU] | ORAL_CAPSULE | ORAL | 0 refills | Status: DC

## 2018-03-24 NOTE — Progress Notes (Signed)
Office: [phone]  /  Fax: [phone]   HPI:   Chief Complaint: OBESITY Samantha Lozano is here to discuss her progress with her obesity treatment plan. She is on the Category 1 plan and is following her eating plan approximately 95 % of the time. She states she is walking for 30-40 minutes 6 times per week. Samantha Lozano did very well with weight loss on Category 1 plan, biggest complaint was eating all the protein (70 grams daily). Hunger was controlled overall. Her weight is 230 lb (104.3 kg) today and has had a weight loss of 7 pounds over a period of 2 weeks since her last visit. She has lost 7 lbs since starting treatment with [name].  Vitamin D Deficiency Samantha Lozano has a new diagnosis of vitamin D deficiency. She is not on Vit D, not at goal. She notes fatigue and denies nausea, vomiting or muscle weakness.  Grade I Diastolic Dysfunction Per echocardiogram, normal ejection fraction. She denies shortness of breath at rest or orthopnea. She notes history of hypertension (mostly controlled) diabetes mellitus II and obesity.  ALLERGIES: Allergies  Allergen Reactions  . Lisinopril Cough    MEDICATIONS: Current Outpatient Medications on File Prior to Visit  Medication Sig Dispense Refill  . aspirin EC 81 MG tablet Take 81 mg by mouth daily.    . carvedilol (COREG) 25 MG tablet Take 25 mg by mouth 2 (two) times daily with a meal.    . glucose blood test strip Use as instructed 100 each 0  . hydrochlorothiazide (HYDRODIURIL) 25 MG tablet Take 25 mg by mouth daily.    [name] levothyroxine (SYNTHROID, LEVOTHROID) 125 MCG tablet Take 125 mcg by mouth daily before breakfast.    . lovastatin (MEVACOR) 40 MG tablet Take 40 mg by mouth at bedtime.    . metFORMIN (GLUCOPHAGE-XR) 750 MG 24 hr tablet Take 750 mg by mouth daily with breakfast.    . Omega-3 Fatty Acids (FISH OIL) 435 MG CAPS Take 1 capsule by mouth daily at 2 PM.     No current facility-administered medications on file prior to visit.     PAST  MEDICAL HISTORY: Past Medical History:  Diagnosis Date  . Diabetes mellitus, type 2 ([place])   . HLD (hyperlipidemia)   . HTN (hypertension)   . Leg edema   . Other specified disorders of thyroid     PAST SURGICAL HISTORY: Past Surgical History:  Procedure Laterality Date  . ABDOMINAL HYSTERECTOMY    . THYROID SURGERY      SOCIAL HISTORY: Social History   Tobacco Use  . Smoking status: Never Smoker  . Smokeless tobacco: Never Used  Substance Use Topics  . Alcohol use: Not on file  . Drug use: Not on file    FAMILY HISTORY: Family History  Problem Relation Age of Onset  . Diabetes Mother   . Hypertension Mother   . Cancer Mother   . Obesity Mother   . Hypertension Father   . Cancer Father   . Obesity Father     ROS: Review of Systems  Constitutional: Positive for malaise/fatigue and weight loss.  Respiratory: Negative for shortness of breath (at rest).   Cardiovascular: Negative for orthopnea.  Gastrointestinal: Negative for nausea and vomiting.  Musculoskeletal:       Negative muscle weakness    PHYSICAL EXAM: Blood pressure 135/78, pulse 66, temperature 98 F (36.7 C), temperature source Oral, height 5\' 2"  (1.575 m), weight 230 lb (104.3 kg), SpO2 98 %. Body mass index  is 42.07 kg/m. Physical Exam  Constitutional: She is oriented to person, place, and time. She appears well-developed and well-nourished.  Cardiovascular: Normal rate.  Pulmonary/Chest: Effort normal.  Musculoskeletal: Normal range of motion.  Neurological: She is oriented to person, place, and time.  Skin: Skin is warm and dry.  Psychiatric: She has a normal mood and affect. Her behavior is normal.  Vitals reviewed.   RECENT LABS AND TESTS: BMET    Component Value Date/Time   NA 141 03/10/2018 0839   K 4.4 03/10/2018 0839   CL 101 03/10/2018 0839   CO2 25 03/10/2018 0839   GLUCOSE 152 (H) 03/10/2018 0839   GLUCOSE 126 (H) 09/19/2007 1340   BUN 12 03/10/2018 0839   CREATININE  0.65 03/10/2018 0839   CALCIUM 9.6 03/10/2018 0839   GFRNONAA 90 03/10/2018 0839   GFRAA 104 03/10/2018 0839   Lab Results  Component Value Date   HGBA1C 7.4 (H) 03/10/2018   Lab Results  Component Value Date   INSULIN 15.0 03/10/2018   CBC    Component Value Date/Time   WBC 6.5 03/10/2018 0839   WBC 10.3 09/24/2007 0515   RBC 4.47 03/10/2018 0839   RBC 3.89 09/24/2007 0515   HGB 12.1 03/10/2018 0839   HCT 37.6 03/10/2018 0839   PLT 208 09/24/2007 0515   MCV 84 03/10/2018 0839   MCH 27.1 03/10/2018 0839   MCHC 32.2 03/10/2018 0839   MCHC 34.6 09/24/2007 0515   RDW 14.7 03/10/2018 0839   LYMPHSABS 2.0 03/10/2018 0839   EOSABS 0.3 03/10/2018 0839   BASOSABS 0.0 03/10/2018 0839   Iron/TIBC/Ferritin/ %Sat No results found for: IRON, TIBC, FERRITIN, IRONPCTSAT Lipid Panel     Component Value Date/Time   CHOL 149 03/10/2018 0839   TRIG 125 03/10/2018 0839   HDL 42 03/10/2018 0839   LDLCALC 82 03/10/2018 0839   Hepatic Function Panel     Component Value Date/Time   PROT 7.2 03/10/2018 0839   ALBUMIN 4.5 03/10/2018 0839   AST 15 03/10/2018 0839   ALT 16 03/10/2018 0839   ALKPHOS 95 03/10/2018 0839   BILITOT 0.5 03/10/2018 0839      Component Value Date/Time   TSH 1.300 03/10/2018 0839  Results for CHANTILLY, LINSKEY (MRN [id number]) as of 03/24/2018 16:35  Ref. Range 03/10/2018 08:39  Vitamin D, 25-Hydroxy Latest Ref Range: 30.0 - 100.0 ng/mL 30.2    ASSESSMENT AND PLAN: Vitamin D deficiency - Plan: Vitamin D, Ergocalciferol, (DRISDOL) 50000 units CAPS capsule  Grade I diastolic dysfunction  Class 3 severe obesity with serious comorbidity and body mass index (BMI) of 40.0 to 44.9 in adult, unspecified obesity type (HCC)  PLAN:  Vitamin D Deficiency Samantha Lozano was informed that low vitamin D levels contributes to fatigue and are associated with obesity, breast, and colon cancer. Samantha Lozano agrees to start prescription Vit D @50 ,000 IU every week #4 with no refills. She  will follow up for routine testing of vitamin D, at least 2-3 times per year. She was informed of the risk of over-replacement of vitamin D and agrees to not increase her dose unless she discusses this with [name] first. Samantha Lozano agrees to follow up with our clinic in 2 weeks.  Grade I Diastolic Dysfunction Samantha Lozano will continue diet, exercise, and weight loss. She is to decrease Na+ and will monitor closely. Samantha Lozano agrees to follow up with our clinic in 2 weeks.  Obesity Samantha Lozano is currently in the action stage of change. As such, her goal  is to continue with weight loss efforts She has agreed to follow the Category 1 plan Samantha Lozano has been instructed to work up to a goal of 150 minutes of combined cardio and strengthening exercise per week for weight loss and overall health benefits. We discussed the following Behavioral Modification Strategies today: increasing lean protein intake, decreasing simple carbohydrates, and no skipping meals    Samantha Lozano has agreed to follow up with our clinic in 2 weeks. She was informed of the importance of frequent follow up visits to maximize her success with intensive lifestyle modifications for her multiple health conditions.   OBESITY BEHAVIORAL INTERVENTION VISIT  Today's visit was # 2 out of 22.  Starting weight: 237 lbs Starting date: 03/10/18 Today's weight : 230 lbs  Today's date: 03/24/2018 Total lbs lost to date: 7    ASK: We discussed the diagnosis of obesity with Samantha Lozano today and Samantha Lozano agreed to give [name] permission to discuss obesity behavioral modification therapy today.  ASSESS: Samantha Lozano has the diagnosis of obesity and her BMI today is 42.06 Samantha Lozano is in the action stage of change   ADVISE: Samantha Lozano was educated on the multiple health risks of obesity as well as the benefit of weight loss to improve her health. She was advised of the need for long term treatment and the importance of lifestyle modifications.  AGREE: Multiple dietary  modification options and treatment options were discussed and  Samantha Lozano agreed to the above obesity treatment plan.  I, Trixie Dredge, am acting as transcriptionist for Dennard Nip, MD  I have reviewed the above documentation for accuracy and completeness, and I agree with the above. -Dennard Nip, MD

## 2018-03-25 NOTE — Progress Notes (Signed)
Patient ID: Samantha Lozano, female   DOB: Sep 23, 1945, 72 y.o.   MRN: [id number]           Reason for Appointment: Consultation for Type 2 Diabetes  Referring physician: Blanchie Serve   History of Present Illness:          Date of diagnosis of type 2 diabetes mellitus: ?  2014       Background history:   She does not know when her diabetes was diagnosed no previous records are available Her PCP has been following her diabetes and is being treated with metformin since diagnosis Has not been on any other oral hypoglycemic drug Her A1c done 11/18 was high at 7.6 and previously 7.2  Recent history:      She is being referred here because of her A1c being 8% in 01/2018  Non-insulin hypoglycemic drugs the patient is taking are: Metformin ER 750 mg twice daily  Current management, blood sugar patterns and problems identified:  She was started on a meal plan by her weight loss specialist Dr. Leafy Ro on 03/10/2018  With this she has cut back on snacks, sweets and reduce carbohydrate intake in general such as oatmeal in the morning  She does normally try to walk regularly on her own  Prior to her change in diet with blood sugars at home will be running between about 160-200 fasting and more recently they have been frequently below 150 also  This month she has done some readings after meals and the highest reading was 183 after lunch otherwise fairly good   In the past she has had difficulty losing weight but her weight is down about 10 pounds since her visit in 5/19 with her PCP        Side effects from medications have been: None  Compliance with the medical regimen: Improving   Glucose monitoring:  done about 2 times a day         Glucometer: One Touch.       Blood Glucose readings by time of day and averages from home blood sugar record :  PREMEAL Breakfast Lunch Dinner Bedtime  Overall   Glucose range:  137-176      Median:        POST-MEAL PC Breakfast PC Lunch PC  Dinner  Glucose range:     Median:   144-183  143-162   Self-care: The diet that the patient has been following is: tries to limit sweets and high-fat foods.      Typical meal intake: Breakfast is    usually eggs and toast             Dietician visit, most recent: 03/2018               Exercise:  Walking in the morning up to 1 hours 6/7 days a week and gardening  Weight history:  Wt Readings from Last 3 Encounters:  03/26/18 236 lb (107 kg)  03/24/18 230 lb (104.3 kg)  03/10/18 237 lb (107.5 kg)    Glycemic control:   Lab Results  Component Value Date   HGBA1C 7.4 (H) 03/10/2018   Lab Results  Component Value Date   LDLCALC 82 03/10/2018   CREATININE 0.65 03/10/2018   No results found for: MICRALBCREAT  No results found for: FRUCTOSAMINE    Allergies as of 03/26/2018      Reactions   Lisinopril Cough      Medication List  Accurate as of 03/26/18  2:57 PM. Always use your most recent med list.          aspirin EC 81 MG tablet Take 81 mg by mouth daily.   canagliflozin 100 MG Tabs tablet Commonly known as:  INVOKANA 1 tablet before breakfast   carvedilol 25 MG tablet Commonly known as:  COREG Take 25 mg by mouth 2 (two) times daily with a meal.   Fish Oil 435 MG Caps Take 1 capsule by mouth daily at 2 PM.   glucose blood test strip Use as instructed   hydrochlorothiazide 25 MG tablet Commonly known as:  HYDRODIURIL Take 25 mg by mouth daily.   levothyroxine 125 MCG tablet Commonly known as:  SYNTHROID, LEVOTHROID Take 125 mcg by mouth daily before breakfast.   lovastatin 40 MG tablet Commonly known as:  MEVACOR Take 40 mg by mouth at bedtime.   metFORMIN 750 MG 24 hr tablet Commonly known as:  GLUCOPHAGE-XR Take 750 mg by mouth daily with breakfast.   Vitamin D (Ergocalciferol) 50000 units Caps capsule Commonly known as:  DRISDOL Take 1 capsule (50,000 Units total) by mouth every 7 (seven) days.       Allergies:  Allergies    Allergen Reactions  . Lisinopril Cough    Past Medical History:  Diagnosis Date  . Diabetes mellitus, type 2 ([place])   . HLD (hyperlipidemia)   . HTN (hypertension)   . Leg edema   . Other specified disorders of thyroid     Past Surgical History:  Procedure Laterality Date  . ABDOMINAL HYSTERECTOMY    . THYROID SURGERY      Family History  Problem Relation Age of Onset  . Diabetes Mother   . Hypertension Mother   . Cancer Mother   . Obesity Mother   . Hypertension Father   . Cancer Father   . Obesity Father     Social History:  reports that she has never smoked. She has never used smokeless tobacco. Her alcohol and drug histories are not on file.   Review of Systems  Cardiovascular: Positive for leg swelling.     Lipid history:     Lab Results  Component Value Date   CHOL 149 03/10/2018   HDL 42 03/10/2018   LDLCALC 82 03/10/2018   TRIG 125 03/10/2018           Hypertension:  BP Readings from Last 3 Encounters:  03/26/18 134/84  03/24/18 135/78  03/10/18 (!) 146/79    Most recent eye exam was in  Most recent foot exam:  Tr thyr  LABS:  No visits with results within 1 Week(s) from this visit.  Latest known visit with results is:  Office Visit on 03/10/2018  Component Date Value Ref Range Status  . Glucose 03/10/2018 152* 65 - 99 mg/dL Final  . BUN 03/10/2018 12  8 - 27 mg/dL Final  . Creatinine, Ser 03/10/2018 0.65  0.57 - 1.00 mg/dL Final  . GFR calc non Af Amer 03/10/2018 90  >59 mL/min/1.73 Final  . GFR calc Af Amer 03/10/2018 104  >59 mL/min/1.73 Final  . BUN/Creatinine Ratio 03/10/2018 18  12 - 28 Final  . Sodium 03/10/2018 141  134 - 144 mmol/L Final  . Potassium 03/10/2018 4.4  3.5 - 5.2 mmol/L Final  . Chloride 03/10/2018 101  96 - 106 mmol/L Final  . CO2 03/10/2018 25  20 - 29 mmol/L Final  . Calcium 03/10/2018 9.6  8.7 - 10.3 mg/dL  Final  . Total Protein 03/10/2018 7.2  6.0 - 8.5 g/dL Final  . Albumin 03/10/2018 4.5  3.5 - 4.8  g/dL Final  . Globulin, Total 03/10/2018 2.7  1.5 - 4.5 g/dL Final  . Albumin/Globulin Ratio 03/10/2018 1.7  1.2 - 2.2 Final  . Bilirubin Total 03/10/2018 0.5  0.0 - 1.2 mg/dL Final  . Alkaline Phosphatase 03/10/2018 95  39 - 117 IU/L Final  . AST 03/10/2018 15  0 - 40 IU/L Final  . ALT 03/10/2018 16  0 - 32 IU/L Final  . Vitamin B-12 03/10/2018 293  232 - 1,245 pg/mL Final  . WBC 03/10/2018 6.5  3.4 - 10.8 x10E3/uL Final  . RBC 03/10/2018 4.47  3.77 - 5.28 x10E6/uL Final  . Hemoglobin 03/10/2018 12.1  11.1 - 15.9 g/dL Final  . Hematocrit 03/10/2018 37.6  34.0 - 46.6 % Final  . MCV 03/10/2018 84  79 - 97 fL Final  . MCH 03/10/2018 27.1  26.6 - 33.0 pg Final  . MCHC 03/10/2018 32.2  31.5 - 35.7 g/dL Final  . RDW 03/10/2018 14.7  12.3 - 15.4 % Final  . Neutrophils 03/10/2018 59  Not Estab. % Final  . Lymphs 03/10/2018 31  Not Estab. % Final  . Monocytes 03/10/2018 5  Not Estab. % Final  . Eos 03/10/2018 5  Not Estab. % Final  . Basos 03/10/2018 0  Not Estab. % Final  . Neutrophils Absolute 03/10/2018 3.8  1.4 - 7.0 x10E3/uL Final  . Lymphocytes Absolute 03/10/2018 2.0  0.7 - 3.1 x10E3/uL Final  . Monocytes Absolute 03/10/2018 0.3  0.1 - 0.9 x10E3/uL Final  . EOS (ABSOLUTE) 03/10/2018 0.3  0.0 - 0.4 x10E3/uL Final  . Basophils Absolute 03/10/2018 0.0  0.0 - 0.2 x10E3/uL Final  . Immature Granulocytes 03/10/2018 0  Not Estab. % Final  . Immature Grans (Abs) 03/10/2018 0.0  0.0 - 0.1 x10E3/uL Final  . Hgb A1c MFr Bld 03/10/2018 7.4* 4.8 - 5.6 % Final   Comment:          Prediabetes: 5.7 - 6.4          Diabetes: >6.4          Glycemic control for adults with diabetes: <7.0   . Est. average glucose Bld gHb Est-m* 03/10/2018 166  mg/dL Final  . INSULIN 03/10/2018 15.0  2.6 - 24.9 uIU/mL Final  . Cholesterol, Total 03/10/2018 149  100 - 199 mg/dL Final  . Triglycerides 03/10/2018 125  0 - 149 mg/dL Final  . HDL 03/10/2018 42  >39 mg/dL Final  . VLDL Cholesterol Cal 03/10/2018 25  5 - 40  mg/dL Final  . LDL Calculated 03/10/2018 82  0 - 99 mg/dL Final  . LDl/HDL Ratio 03/10/2018 2.0  0.0 - 3.2 ratio Final   Comment:                                     LDL/HDL Ratio                                             Men  Women                               1/2 Avg.Risk  1.0    1.5                                   Avg.Risk  3.6    3.2                                2X Avg.Risk  6.2    5.0                                3X Avg.Risk  8.0    6.1   . T3, Total 03/10/2018 115  71 - 180 ng/dL Final  . Free T4 03/10/2018 1.78* 0.82 - 1.77 ng/dL Final  . TSH 03/10/2018 1.300  0.450 - 4.500 uIU/mL Final  . Vit D, 25-Hydroxy 03/10/2018 30.2  30.0 - 100.0 ng/mL Final   Comment: Vitamin D deficiency has been defined by the [place] practice guideline as a level of serum 25-OH vitamin D less than 20 ng/mL (1,2). The Endocrine Society went on to further define vitamin D insufficiency as a level between 21 and 29 ng/mL (2). 1. IOM (Institute of Medicine). 2010. Dietary reference    intakes for calcium and D. [place]: The    [employer]. 2. Holick MF, [place], Bischoff-Ferrari HA, et al.    Evaluation, treatment, and prevention of vitamin D    deficiency: an Endocrine Society clinical practice    guideline. JCEM. 2011 Jul; 96(7):1911-30.   . Creatinine, Urine 03/10/2018 40.3  Not Estab. mg/dL Final  . Microalbumin, Urine 03/10/2018 <3.0  Not Estab. ug/mL Final  . Microalb/Creat Ratio 03/10/2018 <7.4  0.0 - 30.0 mg/g creat Final   Comment:                      Normal:                0.0 -  30.0                      Albuminuria:          31.0 - 300.0                      Clinical albuminuria:       >300.0     Physical Examination:  BP 134/84   Pulse 72   Ht 5\' 2"  (1.575 m)   Wt 236 lb (107 kg)   SpO2 96%   BMI 43.16 kg/m   GENERAL:         Patient has generalized obesity.    HEENT:         Eye exam shows normal external  appearance.  Fundus exam shows no retinopathy.  Oral exam shows normal mucosa .  NECK:   There is no lymphadenopathy   Thyroid is not enlarged and no nodules felt.  Carotids are normal to palpation and no bruit heard  LUNGS:         Chest is symmetrical. Lungs are clear to auscultation.[name]   HEART:         Heart sounds:  S1 and S2 are normal. No murmur or click heard., no S3 or S4.   ABDOMEN:   There is no distention present. Liver and spleen  are not palpable. No other mass or tenderness present.    NEUROLOGICAL:   Ankle jerks are absent bilaterally.    Diabetic Foot Exam - Simple   Simple Foot Form Diabetic Foot exam was performed with the following findings:  Yes   Visual Inspection No deformities, no ulcerations, no other skin breakdown bilaterally:  Yes Sensation Testing Intact to touch and monofilament testing bilaterally:  Yes Pulse Check Posterior Tibialis and Dorsalis pulse intact bilaterally:  Yes Comments            Vibration sense is minimally reduced in distal first toes. MUSCULOSKELETAL:  There is no swelling or deformity of the peripheral joints.     EXTREMITIES:     There is no ankle edema.  SKIN:       No rash or lesions of concern.        ASSESSMENT:  Diabetes type 2 with morbid obesity, uncontrolled  See history of present illness for detailed discussion of current diabetes management, blood sugar patterns and problems identified  She has had A1c gradually increasing over the last year indicating progression of her diabetes which has been treated with only metformin since diagnosis Overall she has not had any consistent diet until recently and difficulty with weight loss Also has had minimal diabetes education  This month with starting her program with the weight management center her diet is improving significantly and has had some improvement in her blood sugars although still averaging 150+ Discussed with the patient that she does need a more long-term  plan to manage her diabetes and achieve and maintain weight loss Also because of her obesity, history of hypertension and hyperlipidemia needs risk reduction with a drug that would reduce cardiovascular risk also in addition to metformin  Complications of diabetes: None evident  HYPERTENSION: Fairly well controlled recently and monitor at home However she is not on an ACE or ARB agent  History of dependent EDEMA which is recently improving with improved diet, likely less sodium intake   PLAN:    She is a good candidate for medication like Invokana for multiple benefits including better low blood sugar control, likely long-term improvement in glucose control, cardiovascular risk reduction, weight loss and improve blood pressure and reduce tendency to edema. Discussed action of SGLT 2 drugs on lowering glucose by decreasing kidney absorption of glucose, benefits of weight loss and lower blood pressure, possible side effects including candidiasis and dosage regimen   She will start with 100 mg daily in the morning  She will continue metformin ER currently and depending on her final Invokana dose may use Invokamet  She will reduce her HCTZ to half tablet  Also with monitoring her blood sugar if she is getting readings below 712 systolic or 70 diastolic she will stop the HCTZ  Continue excellent diet and exercise regimen  Follow-up in 1 month  Patient Instructions  Reduce hydrochlorothiazide half tablet  Check blood sugars on waking up    Also check blood sugars about 2 hours after a meal and do this after different meals by rotation  Recommended blood sugar levels on waking up is 90-130 and about 2 hours after meal is 130-160  Please bring your blood sugar monitor to each visit, thank you  Invokana in am       Consultation note has been sent to the referring physician  Elayne Snare 03/26/2018, 2:57 PM   Note: This office note was prepared with Dragon voice recognition  system technology. Any transcriptional  errors that result from this process are unintentional.

## 2018-03-26 ENCOUNTER — Encounter: Payer: Self-pay | Admitting: Endocrinology

## 2018-03-26 ENCOUNTER — Ambulatory Visit (INDEPENDENT_AMBULATORY_CARE_PROVIDER_SITE_OTHER): Payer: PPO | Admitting: Endocrinology

## 2018-03-26 VITALS — BP 134/84 | HR 72 | Ht 62.0 in | Wt 236.0 lb

## 2018-03-26 DIAGNOSIS — E1165 Type 2 diabetes mellitus with hyperglycemia: Secondary | ICD-10-CM | POA: Diagnosis not present

## 2018-03-26 DIAGNOSIS — I10 Essential (primary) hypertension: Secondary | ICD-10-CM

## 2018-03-26 MED ORDER — CANAGLIFLOZIN 100 MG PO TABS: ORAL_TABLET | ORAL | 3 refills | Status: DC

## 2018-03-26 NOTE — Patient Instructions (Signed)
Reduce hydrochlorothiazide half tablet  Check blood sugars on waking up    Also check blood sugars about 2 hours after a meal and do this after different meals by rotation  Recommended blood sugar levels on waking up is 90-130 and about 2 hours after meal is 130-160  Please bring your blood sugar monitor to each visit, thank you  Invokana in am

## 2018-03-27 ENCOUNTER — Telehealth: Payer: Self-pay | Admitting: Endocrinology

## 2018-03-27 NOTE — Telephone Encounter (Signed)
canagliflozin (INVOKANA) 100 MG TABS tablet   Patient stated that when she came in to see Dr Dwyane Dee that he told her this would be about $15 at the drugstore but when she went into the pharmacy it was over $45 and she can not afford that,  Please advise

## 2018-03-27 NOTE — Telephone Encounter (Signed)
Called pharmacy that is listed in patient's chart and spoke with a pharmacy staff member. Pharmacy staff would only state "we will run the free 30 day supply card, and if it is approved, we will fill it for the patient." I then attempted to call the pt and notify her of this and she did not answer. I was not able to leave a voicemail.

## 2018-03-27 NOTE — Telephone Encounter (Signed)
Patient took the card she was given for Invokana (free 30 day supply) to [facility] at [facility]. They told her she could not use it because she has Medicare Part D. Please call patient at ph# [phone] to discuss.

## 2018-03-27 NOTE — Telephone Encounter (Signed)
Please tell pharmacy that this is not a discount card and it is a free 30-day supply not affected by Medicare

## 2018-03-27 NOTE — Telephone Encounter (Signed)
Alternative  

## 2018-03-27 NOTE — Telephone Encounter (Signed)
Called pt and gave her MD message. Pt verbalized understanding. 

## 2018-03-27 NOTE — Telephone Encounter (Signed)
She should get the first prescription with the 30-day free coupon.  Her next prescription can be for a higher dose and she can take half tablet which will bring her co-pay down to 50 % per month

## 2018-04-09 ENCOUNTER — Other Ambulatory Visit: Payer: Self-pay

## 2018-04-09 ENCOUNTER — Ambulatory Visit (INDEPENDENT_AMBULATORY_CARE_PROVIDER_SITE_OTHER): Payer: PPO | Admitting: Family Medicine

## 2018-04-09 ENCOUNTER — Telehealth: Payer: Self-pay | Admitting: Endocrinology

## 2018-04-09 VITALS — BP 124/74 | HR 55 | Temp 98.1°F | Ht 62.0 in | Wt 227.0 lb

## 2018-04-09 DIAGNOSIS — E559 Vitamin D deficiency, unspecified: Secondary | ICD-10-CM

## 2018-04-09 DIAGNOSIS — E119 Type 2 diabetes mellitus without complications: Secondary | ICD-10-CM | POA: Diagnosis not present

## 2018-04-09 DIAGNOSIS — Z6841 Body Mass Index (BMI) 40.0 and over, adult: Secondary | ICD-10-CM | POA: Diagnosis not present

## 2018-04-09 MED ORDER — SITAGLIPTIN PHOSPHATE 100 MG PO TABS: 100.0000 mg | ORAL_TABLET | Freq: Every day | ORAL | 3 refills | Status: DC

## 2018-04-09 MED ORDER — VITAMIN D (ERGOCALCIFEROL) 1.25 MG (50000 UNIT) PO CAPS: 50000.0000 [IU] | ORAL_CAPSULE | ORAL | 0 refills | Status: DC

## 2018-04-09 NOTE — Telephone Encounter (Signed)
Does the pt still need blood work?

## 2018-04-09 NOTE — Telephone Encounter (Signed)
Send prescription for Januvia 100 mg daily and we can see her in about a month from now, labs before the 1 month visit

## 2018-04-09 NOTE — Telephone Encounter (Signed)
Patient stated that Dr Dwyane Dee wanted her to come in for the blood work before her visit because he was putting her on  canagliflozin (INVOKANA) 100 MG TABS tablet . She stated she could not afford this so she has not been taking it She is still on her original medication. She would like to know if she still needs to come in for this blood test since the medication was not taken.  Please advise

## 2018-04-09 NOTE — Telephone Encounter (Signed)
Rx was sent to pharmacy. 

## 2018-04-10 NOTE — Telephone Encounter (Signed)
Called pt and left voicemail requesting that she call the office and make an appointment to be seen in approx. 1 month with labs 1-3 days before the visit.

## 2018-04-10 NOTE — Progress Notes (Signed)
Office: [phone]  /  Fax: [phone]   HPI:   Chief Complaint: OBESITY Samantha Lozano is here to discuss her progress with her obesity treatment plan. She is on the Category 1 plan and is following her eating plan approximately 95 % of the time. She states she is walking for 30-40 minutes 6 times per week. Samantha Lozano continues to do well with Category 1 plan but is struggling to eat all her supper protein and would like to have a bit more to eat at breakfast.  Her weight is 227 lb (103 kg) today and has had a weight loss of 3 pounds over a period of 2 weeks since her last visit. She has lost 10 lbs since starting treatment with [name].  Diabetes II Samantha Lozano has a diagnosis of diabetes type II. Samantha Lozano saw Dr. Dwyane Dee, her Endocrinologist who started her on invokana and decreased hydrochlorothiazide to 1/2 pill in addition to metformin. She states the invokana was too expensive so she couldn't get it. Her fasting BGs mostly range between 113 and 140 and post prandial BGs ranging between 117 and 140 which has improved since adjusting her diet prescription. Last A1c was 7.4. She has been working on intensive lifestyle modifications including diet, exercise, and weight loss to help control her blood glucose levels.  Vitamin D Deficiency Samantha Lozano has a diagnosis of vitamin D deficiency. She is stable on prescription Vit D, not yet at goal. She denies nausea, vomiting or muscle weakness.  ALLERGIES: Allergies  Allergen Reactions  . Lisinopril Cough    MEDICATIONS: Current Outpatient Medications on File Prior to Visit  Medication Sig Dispense Refill  . aspirin EC 81 MG tablet Take 81 mg by mouth daily.    . canagliflozin (INVOKANA) 100 MG TABS tablet 1 tablet before breakfast 30 tablet 3  . carvedilol (COREG) 25 MG tablet Take 25 mg by mouth 2 (two) times daily with a meal.    . glucose blood test strip Use as instructed 100 each 0  . hydrochlorothiazide (HYDRODIURIL) 25 MG tablet Take 25 mg by mouth daily.     [name] levothyroxine (SYNTHROID, LEVOTHROID) 125 MCG tablet Take 125 mcg by mouth daily before breakfast.    . lovastatin (MEVACOR) 40 MG tablet Take 40 mg by mouth at bedtime.    . metFORMIN (GLUCOPHAGE-XR) 750 MG 24 hr tablet Take 750 mg by mouth daily with breakfast.    . Omega-3 Fatty Acids (FISH OIL) 435 MG CAPS Take 1 capsule by mouth daily at 2 PM.     No current facility-administered medications on file prior to visit.     PAST MEDICAL HISTORY: Past Medical History:  Diagnosis Date  . Diabetes mellitus, type 2 ([place])   . HLD (hyperlipidemia)   . HTN (hypertension)   . Leg edema   . Other specified disorders of thyroid     PAST SURGICAL HISTORY: Past Surgical History:  Procedure Laterality Date  . ABDOMINAL HYSTERECTOMY    . THYROID SURGERY      SOCIAL HISTORY: Social History   Tobacco Use  . Smoking status: Never Smoker  . Smokeless tobacco: Never Used  Substance Use Topics  . Alcohol use: Not on file  . Drug use: Not on file    FAMILY HISTORY: Family History  Problem Relation Age of Onset  . Diabetes Mother   . Hypertension Mother   . Cancer Mother   . Obesity Mother   . Hypertension Father   . Cancer Father   . Obesity Father  ROS: Review of Systems  Constitutional: Positive for weight loss.  Gastrointestinal: Negative for nausea and vomiting.  Musculoskeletal:       Negative muscle weakness  Endo/Heme/Allergies:       Negative hypoglycemia    PHYSICAL EXAM: Blood pressure 124/74, pulse (!) 55, temperature 98.1 F (36.7 C), temperature source Oral, height 5\' 2"  (1.575 m), weight 227 lb (103 kg), SpO2 98 %. Body mass index is 41.52 kg/m. Physical Exam  Constitutional: She is oriented to person, place, and time. She appears well-developed and well-nourished.  Cardiovascular: Normal rate.  Pulmonary/Chest: Effort normal.  Musculoskeletal: Normal range of motion.  Neurological: She is oriented to person, place, and time.  Skin: Skin is warm  and dry.  Psychiatric: She has a normal mood and affect. Her behavior is normal.  Vitals reviewed.   RECENT LABS AND TESTS: BMET    Component Value Date/Time   NA 141 03/10/2018 0839   K 4.4 03/10/2018 0839   CL 101 03/10/2018 0839   CO2 25 03/10/2018 0839   GLUCOSE 152 (H) 03/10/2018 0839   GLUCOSE 126 (H) 09/19/2007 1340   BUN 12 03/10/2018 0839   CREATININE 0.65 03/10/2018 0839   CALCIUM 9.6 03/10/2018 0839   GFRNONAA 90 03/10/2018 0839   GFRAA 104 03/10/2018 0839   Lab Results  Component Value Date   HGBA1C 7.4 (H) 03/10/2018   Lab Results  Component Value Date   INSULIN 15.0 03/10/2018   CBC    Component Value Date/Time   WBC 6.5 03/10/2018 0839   WBC 10.3 09/24/2007 0515   RBC 4.47 03/10/2018 0839   RBC 3.89 09/24/2007 0515   HGB 12.1 03/10/2018 0839   HCT 37.6 03/10/2018 0839   PLT 208 09/24/2007 0515   MCV 84 03/10/2018 0839   MCH 27.1 03/10/2018 0839   MCHC 32.2 03/10/2018 0839   MCHC 34.6 09/24/2007 0515   RDW 14.7 03/10/2018 0839   LYMPHSABS 2.0 03/10/2018 0839   EOSABS 0.3 03/10/2018 0839   BASOSABS 0.0 03/10/2018 0839   Iron/TIBC/Ferritin/ %Sat No results found for: IRON, TIBC, FERRITIN, IRONPCTSAT Lipid Panel     Component Value Date/Time   CHOL 149 03/10/2018 0839   TRIG 125 03/10/2018 0839   HDL 42 03/10/2018 0839   LDLCALC 82 03/10/2018 0839   Hepatic Function Panel     Component Value Date/Time   PROT 7.2 03/10/2018 0839   ALBUMIN 4.5 03/10/2018 0839   AST 15 03/10/2018 0839   ALT 16 03/10/2018 0839   ALKPHOS 95 03/10/2018 0839   BILITOT 0.5 03/10/2018 0839      Component Value Date/Time   TSH 1.300 03/10/2018 0839  Results for Samantha, Lozano (MRN [id number]) as of 04/10/2018 12:41  Ref. Range 03/10/2018 08:39  Vitamin D, 25-Hydroxy Latest Ref Range: 30.0 - 100.0 ng/mL 30.2    ASSESSMENT AND PLAN: Type 2 diabetes mellitus without complication, without long-term current use of insulin (HCC)  Vitamin D deficiency - Plan:  Vitamin D, Ergocalciferol, (DRISDOL) 50000 units CAPS capsule  Class 3 severe obesity with serious comorbidity and body mass index (BMI) of 40.0 to 44.9 in adult, unspecified obesity type ([place])  PLAN:  Diabetes II Samantha Lozano has been given extensive diabetes education by myself today including ideal fasting and post-prandial blood glucose readings, individual ideal Hgb A1c goals and hypoglycemia prevention. We discussed the importance of good blood sugar control to decrease the likelihood of diabetic complications such as nephropathy, neuropathy, limb loss, blindness, coronary artery disease, and death.  We discussed the importance of intensive lifestyle modification including diet, exercise and weight loss as the first line treatment for diabetes. Samantha Lozano agrees to continue metformin as is and we will continue to work on controlling with diet and follow closely. She is to not change her hydrochlorothiazide dose. Samantha Lozano agrees to follow up with our clinic in 2 to 3 weeks  Vitamin D Deficiency Samantha Lozano was informed that low vitamin D levels contributes to fatigue and are associated with obesity, breast, and colon cancer. Samantha Lozano agrees to continue taking prescription Vit D @50 ,000 IU every week #4 and we will refill for 1 month. She will follow up for routine testing of vitamin D, at least 2-3 times per year. She was informed of the risk of over-replacement of vitamin D and agrees to not increase her dose unless she discusses this with [name] first. Samantha Lozano agrees to follow up with our clinic in 2 to 3 weeks.  Obesity Samantha Lozano is currently in the action stage of change. As such, her goal is to continue with weight loss efforts She has agreed to follow the Category 1 plan, Adjust Category 1 to add 8 oz of Fairlife skim milk and decrease supper protein to 4 oz. Samantha Lozano has been instructed to work up to a goal of 150 minutes of combined cardio and strengthening exercise per week for weight loss and overall health  benefits. We discussed the following Behavioral Modification Strategies today: increasing lean protein intake, decreasing simple carbohydrates, increasing vegetables, work on meal planning and easy cooking plans, and skipping meals   Samantha Lozano has agreed to follow up with our clinic in 2 to 3 weeks. She was informed of the importance of frequent follow up visits to maximize her success with intensive lifestyle modifications for her multiple health conditions.   OBESITY BEHAVIORAL INTERVENTION VISIT  Today's visit was # 3 out of 22.  Starting weight: 237 lbs Starting date: 03/10/18 Today's weight : 227 lbs  Today's date: 04/09/2018 Total lbs lost to date: 10    ASK: We discussed the diagnosis of obesity with Samantha Lozano today and Samantha Lozano agreed to give [name] permission to discuss obesity behavioral modification therapy today.  ASSESS: Samantha Lozano has the diagnosis of obesity and her BMI today is 41.51 Samantha Lozano is in the action stage of change   ADVISE: Samantha Lozano was educated on the multiple health risks of obesity as well as the benefit of weight loss to improve her health. She was advised of the need for long term treatment and the importance of lifestyle modifications.  AGREE: Multiple dietary modification options and treatment options were discussed and  Quinnie agreed to the above obesity treatment plan.  I, Samantha Lozano, am acting as transcriptionist for Dennard Nip, MD  I have reviewed the above documentation for accuracy and completeness, and I agree with the above. -Dennard Nip, MD

## 2018-04-15 ENCOUNTER — Other Ambulatory Visit (INDEPENDENT_AMBULATORY_CARE_PROVIDER_SITE_OTHER): Payer: Self-pay | Admitting: Family Medicine

## 2018-04-15 DIAGNOSIS — E559 Vitamin D deficiency, unspecified: Secondary | ICD-10-CM

## 2018-04-24 ENCOUNTER — Ambulatory Visit (INDEPENDENT_AMBULATORY_CARE_PROVIDER_SITE_OTHER): Payer: PPO | Admitting: Family Medicine

## 2018-04-24 VITALS — BP 112/70 | HR 68 | Temp 98.1°F | Ht 62.0 in | Wt 220.0 lb

## 2018-04-24 DIAGNOSIS — E119 Type 2 diabetes mellitus without complications: Secondary | ICD-10-CM | POA: Diagnosis not present

## 2018-04-24 DIAGNOSIS — Z6841 Body Mass Index (BMI) 40.0 and over, adult: Secondary | ICD-10-CM | POA: Diagnosis not present

## 2018-04-24 MED ORDER — GLUCOSE BLOOD VI STRP: ORAL_STRIP | 0 refills | Status: DC

## 2018-04-24 NOTE — Progress Notes (Signed)
Office: [phone]  /  Fax: [phone]   HPI:   Chief Complaint: OBESITY Samantha Lozano is here to discuss her progress with her obesity treatment plan. She is on the Category 1 plan with the adjustment of adding 8 ounces of Fairlife skim milk and decreasing supper protein to 4 ounces and is following her eating plan approximately 95 % of the time. She states she is walking 45 minutes 6 times per week. Samantha Lozano continues to do well with weight loss on her category 1 plan. She enjoys adding milk to dinner and decreasing her protein to four ounces. Her weight is 220 lb (99.8 kg) today and has had a weight loss of 7 pounds over a period of 2 weeks since her last visit. She has lost 17 lbs since starting treatment with [name].  Diabetes II Samantha Lozano has a diagnosis of diabetes type II. She is on metformin and she may be on Januvia, but she can't remember. Samantha Lozano states fasting BGs range mostly between 110 and 140 and 2 hour post prandial mostly range between 123 and 178. She is doiing well with diet and weight loss efforts.  Samantha Lozano denies any hypoglycemic episodes. Last A1c was at 7.4  She has been working on intensive lifestyle modifications including diet, exercise, and weight loss to help control her blood glucose levels.  ALLERGIES: Allergies  Allergen Reactions  . Lisinopril Cough    MEDICATIONS: Current Outpatient Medications on File Prior to Visit  Medication Sig Dispense Refill  . aspirin EC 81 MG tablet Take 81 mg by mouth daily.    . canagliflozin (INVOKANA) 100 MG TABS tablet 1 tablet before breakfast 30 tablet 3  . carvedilol (COREG) 25 MG tablet Take 25 mg by mouth 2 (two) times daily with a meal.    . hydrochlorothiazide (HYDRODIURIL) 25 MG tablet Take 25 mg by mouth daily.    [name] levothyroxine (SYNTHROID, LEVOTHROID) 125 MCG tablet Take 125 mcg by mouth daily before breakfast.    . lovastatin (MEVACOR) 40 MG tablet Take 40 mg by mouth at bedtime.    . metFORMIN (GLUCOPHAGE-XR) 750 MG 24  hr tablet Take 750 mg by mouth daily with breakfast.    . Omega-3 Fatty Acids (FISH OIL) 435 MG CAPS Take 1 capsule by mouth daily at 2 PM.    . sitaGLIPtin (JANUVIA) 100 MG tablet Take 1 tablet (100 mg total) by mouth daily. 30 tablet 3  . Vitamin D, Ergocalciferol, (DRISDOL) 50000 units CAPS capsule Take 1 capsule (50,000 Units total) by mouth every 7 (seven) days. 4 capsule 0   No current facility-administered medications on file prior to visit.     PAST MEDICAL HISTORY: Past Medical History:  Diagnosis Date  . Diabetes mellitus, type 2 (Samantha Lozano)   . HLD (hyperlipidemia)   . HTN (hypertension)   . Leg edema   . Other specified disorders of thyroid     PAST SURGICAL HISTORY: Past Surgical History:  Procedure Laterality Date  . ABDOMINAL HYSTERECTOMY    . THYROID SURGERY      SOCIAL HISTORY: Social History   Tobacco Use  . Smoking status: Never Smoker  . Smokeless tobacco: Never Used  Substance Use Topics  . Alcohol use: Not on file  . Drug use: Not on file    FAMILY HISTORY: Family History  Problem Relation Age of Onset  . Diabetes Mother   . Hypertension Mother   . Cancer Mother   . Obesity Mother   . Hypertension Father   .  Cancer Father   . Obesity Father     ROS: Review of Systems  Constitutional: Positive for weight loss.  Endo/Heme/Allergies:       Negative for hypoglycemia    PHYSICAL EXAM: Blood pressure 112/70, pulse 68, temperature 98.1 F (36.7 C), temperature source Oral, height 5\' 2"  (1.575 m), weight 220 lb (99.8 kg), SpO2 98 %. Body mass index is 40.24 kg/m. Physical Exam  Constitutional: She is oriented to person, place, and time. She appears well-developed and well-nourished.  Cardiovascular: Normal rate.  Pulmonary/Chest: Effort normal.  Musculoskeletal: Normal range of motion.  Neurological: She is oriented to person, place, and time.  Skin: Skin is warm and dry.  Psychiatric: She has a normal mood and affect. Her behavior is normal.   Vitals reviewed.   RECENT LABS AND TESTS: BMET    Component Value Date/Time   NA 141 03/10/2018 0839   K 4.4 03/10/2018 0839   CL 101 03/10/2018 0839   CO2 25 03/10/2018 0839   GLUCOSE 152 (H) 03/10/2018 0839   GLUCOSE 126 (H) 09/19/2007 1340   BUN 12 03/10/2018 0839   CREATININE 0.65 03/10/2018 0839   CALCIUM 9.6 03/10/2018 0839   GFRNONAA 90 03/10/2018 0839   GFRAA 104 03/10/2018 0839   Lab Results  Component Value Date   HGBA1C 7.4 (H) 03/10/2018   Lab Results  Component Value Date   INSULIN 15.0 03/10/2018   CBC    Component Value Date/Time   WBC 6.5 03/10/2018 0839   WBC 10.3 09/24/2007 0515   RBC 4.47 03/10/2018 0839   RBC 3.89 09/24/2007 0515   HGB 12.1 03/10/2018 0839   HCT 37.6 03/10/2018 0839   PLT 208 09/24/2007 0515   MCV 84 03/10/2018 0839   MCH 27.1 03/10/2018 0839   MCHC 32.2 03/10/2018 0839   MCHC 34.6 09/24/2007 0515   RDW 14.7 03/10/2018 0839   LYMPHSABS 2.0 03/10/2018 0839   EOSABS 0.3 03/10/2018 0839   BASOSABS 0.0 03/10/2018 0839   Iron/TIBC/Ferritin/ %Sat No results found for: IRON, TIBC, FERRITIN, IRONPCTSAT Lipid Panel     Component Value Date/Time   CHOL 149 03/10/2018 0839   TRIG 125 03/10/2018 0839   HDL 42 03/10/2018 0839   LDLCALC 82 03/10/2018 0839   Hepatic Function Panel     Component Value Date/Time   PROT 7.2 03/10/2018 0839   ALBUMIN 4.5 03/10/2018 0839   AST 15 03/10/2018 0839   ALT 16 03/10/2018 0839   ALKPHOS 95 03/10/2018 0839   BILITOT 0.5 03/10/2018 0839      Component Value Date/Time   TSH 1.300 03/10/2018 0839   Results for Samantha, Lozano (MRN [id number]) Samantha Lozano  Ref. Range 03/10/2018 08:39  Vitamin D, 25-Hydroxy Latest Ref Range: 30.0 - 100.0 ng/mL 30.2   ASSESSMENT AND PLAN: Type 2 diabetes mellitus without complication, without long-term current use of insulin (HCC) - Plan: glucose blood test strip  Class 3 severe obesity with serious comorbidity and body mass index (BMI) of  40.0 to 44.9 in adult, unspecified obesity type ([place])  PLAN:  Diabetes II Samantha Lozano has been given extensive diabetes education by myself today including ideal fasting and post-prandial blood glucose readings, individual ideal Hgb A1c goals and hypoglycemia prevention. We discussed the importance of good blood sugar control to decrease the likelihood of diabetic complications such Samantha nephropathy, neuropathy, limb loss, blindness, coronary artery disease, and death. We discussed the importance of intensive lifestyle modification including diet, exercise and weight loss Samantha  the first line treatment for diabetes. Elaysha agrees to continue metformin and will let us know if she is still on Jardiance. We will refill test strips for 1 month and Riona agreed to follow up at the agreed upon time.  Obesity Stela is currently in the action stage of change. Samantha such, her goal is to continue with weight loss efforts She has agreed to follow the Category 1 plan Redell has been instructed to work up to a goal of 150 minutes of combined cardio and strengthening exercise per week for weight loss and overall health benefits. We discussed the following Behavioral Modification Strategies today: increasing lean protein intake and better snacking choices  Cedra has agreed to follow up with our clinic in 2 to 3 weeks. She was informed of the importance of frequent follow up visits to maximize her success with intensive lifestyle modifications for her multiple health conditions.   OBESITY BEHAVIORAL INTERVENTION VISIT  Today's visit was # 4 out of 22.  Starting weight: 237 lbs Starting date: 03/10/18 Today's weight : 220 lbs Today's date: 04/24/2018 Total lbs lost to date: 17    ASK: We discussed the diagnosis of obesity with [name] today and Adalis agreed to give [name] permission to discuss obesity behavioral modification therapy today.  ASSESS: Alvine has the diagnosis of obesity and her BMI today is  40.23 Mafalda is in the action stage of change   ADVISE: Emilynn was educated on the multiple health risks of obesity Samantha well Samantha the benefit of weight loss to improve her health. She was advised of the need for long term treatment and the importance of lifestyle modifications.  AGREE: Multiple dietary modification options and treatment options were discussed and  Bethanne agreed to the above obesity treatment plan.  I, Doreene Nest, am acting Samantha transcriptionist for Dennard Nip, MD  I have reviewed the above documentation for accuracy and completeness, and I agree with the above. -Dennard Nip, MD

## 2018-04-28 ENCOUNTER — Other Ambulatory Visit (INDEPENDENT_AMBULATORY_CARE_PROVIDER_SITE_OTHER): Payer: PPO

## 2018-04-28 DIAGNOSIS — E1165 Type 2 diabetes mellitus with hyperglycemia: Secondary | ICD-10-CM

## 2018-04-28 LAB — BASIC METABOLIC PANEL
BUN: 18 mg/dL (ref 6–23)
CO2: 28 mEq/L (ref 19–32)
CREATININE: 0.79 mg/dL (ref 0.40–1.20)
Calcium: 10 mg/dL (ref 8.4–10.5)
Chloride: 100 mEq/L (ref 96–112)
GFR: 76.42 mL/min (ref >60.00)
Glucose, Bld: 134 mg/dL — ABNORMAL HIGH (ref 70–99)
Potassium: 4.1 mEq/L (ref 3.5–5.1)
Sodium: 136 mEq/L (ref 135–145)

## 2018-04-29 LAB — FRUCTOSAMINE: FRUCTOSAMINE: 264 umol/L (ref 0–285)

## 2018-04-30 ENCOUNTER — Encounter: Payer: Self-pay | Admitting: Endocrinology

## 2018-04-30 ENCOUNTER — Ambulatory Visit (INDEPENDENT_AMBULATORY_CARE_PROVIDER_SITE_OTHER): Payer: PPO | Admitting: Endocrinology

## 2018-04-30 VITALS — BP 128/82 | HR 74 | Ht 62.0 in | Wt 222.0 lb

## 2018-04-30 DIAGNOSIS — E1169 Type 2 diabetes mellitus with other specified complication: Secondary | ICD-10-CM | POA: Diagnosis not present

## 2018-04-30 DIAGNOSIS — E669 Obesity, unspecified: Secondary | ICD-10-CM | POA: Diagnosis not present

## 2018-04-30 NOTE — Patient Instructions (Signed)
Check blood sugars on waking up  4/7 days   Also check blood sugars about 2 hours after a meal and do this after different meals by rotation  Recommended blood sugar levels on waking up is 90-130 and about 2 hours after meal is 130-160  Please bring your blood sugar monitor to each visit, thank you

## 2018-04-30 NOTE — Progress Notes (Signed)
Patient ID: Samantha Lozano, female   DOB: 1946-03-15, 72 y.o.   MRN: [id number]           Reason for Appointment: Follow-up for Type 2 Diabetes  Referring physician: Blanchie Serve   History of Present Illness:          Date of diagnosis of type 2 diabetes mellitus: ?  2014       Background history:   She does not know when her diabetes was diagnosed no previous records are available Her PCP has been following her diabetes and is being treated with metformin since diagnosis Has not been on any other oral hypoglycemic drug Her A1c done 11/18 was high at 7.6 and previously 7.2  Recent history:      Her most recent A1c was 7.4 done in 7/19  Non-insulin hypoglycemic drugs the patient is taking are: Metformin ER 750 mg twice daily  Current management, blood sugar patterns and problems identified:  She was recommended starting Invokana on her last visit because of continued high sugars on metformin alone along with potential for weight loss and cardiovascular risk reduction  However this was too expensive for her and she did not get this; also was recommended Januvia but again she did not start this because of expense  However she is on a new meal plan over the last 6 to 7 weeks at the weight loss clinic and she has lost 5 pounds this month  Also compared to the last visit when her blood sugars were averaging over 150 in the morning they are much better  Also has only rare blood sugars as high as 170 after meals although not checking enough after meals  No side effects from current dose of metformin although she had difficulty tolerating regular metformin previously  She is also able to continue to exercise with walking        Side effects from medications have been: None  Compliance with the medical regimen: Improving   Glucose monitoring:  done about 2 times a day         Glucometer: One Touch.       Blood Glucose readings by time of day and averages from home blood  sugar record   FASTING range 116-158 Nonfasting range 116-174 with most readings below 140  Self-care: The diet that the patient has been following is: tries to limit sweets, carbohydrates and high-fat foods.      Typical meal intake: Breakfast is  usually eggs, cheese and toast             Dietician visit, most recent: 03/2018               Exercise:  Walking in the morning up to 1 hours 6/7 days a week and gardening  Weight history:  Wt Readings from Last 3 Encounters:  04/30/18 222 lb (100.7 kg)  04/24/18 220 lb (99.8 kg)  04/09/18 227 lb (103 kg)    Glycemic control:    Lab Results  Component Value Date   HGBA1C 7.4 (H) 03/10/2018   Lab Results  Component Value Date   LDLCALC 82 03/10/2018   CREATININE 0.79 04/28/2018   No results found for: [facility]  Lab Results  Component Value Date   FRUCTOSAMINE 264 04/28/2018      Allergies as of 04/30/2018      Reactions   Lisinopril Cough      Medication List        Accurate as of 04/30/18  4:43 PM. Always use your most recent med list.          aspirin EC 81 MG tablet Take 81 mg by mouth daily.   carvedilol 25 MG tablet Commonly known as:  COREG Take 25 mg by mouth 2 (two) times daily with a meal.   Fish Oil 435 MG Caps Take 1 capsule by mouth daily at 2 PM.   glucose blood test strip Use as instructed   hydrochlorothiazide 25 MG tablet Commonly known as:  HYDRODIURIL Take 25 mg by mouth daily.   levothyroxine 125 MCG tablet Commonly known as:  SYNTHROID, LEVOTHROID Take 125 mcg by mouth daily before breakfast.   lovastatin 40 MG tablet Commonly known as:  MEVACOR Take 40 mg by mouth at bedtime.   metFORMIN 750 MG 24 hr tablet Commonly known as:  GLUCOPHAGE-XR Take 750 mg by mouth daily with breakfast.   sitaGLIPtin 100 MG tablet Commonly known as:  JANUVIA Take 1 tablet (100 mg total) by mouth daily.   Vitamin D (Ergocalciferol) 50000 units Caps capsule Commonly known as:   DRISDOL Take 1 capsule (50,000 Units total) by mouth every 7 (seven) days.       Allergies:  Allergies  Allergen Reactions  . Lisinopril Cough    Past Medical History:  Diagnosis Date  . Diabetes mellitus, type 2 ([place])   . HLD (hyperlipidemia)   . HTN (hypertension)   . Leg edema   . Other specified disorders of thyroid     Past Surgical History:  Procedure Laterality Date  . ABDOMINAL HYSTERECTOMY    . THYROID SURGERY      Family History  Problem Relation Age of Onset  . Diabetes Mother   . Hypertension Mother   . Cancer Mother   . Obesity Mother   . Hypertension Father   . Cancer Father   . Obesity Father     Social History:  reports that she has never smoked. She has never used smokeless tobacco. She reports that she drank alcohol. She reports that she has current or past drug history.   Review of Systems  Cardiovascular: Positive for leg swelling.     Lipid history: Adequately treated with lovastatin    Lab Results  Component Value Date   CHOL 149 03/10/2018   HDL 42 03/10/2018   LDLCALC 82 03/10/2018   TRIG 125 03/10/2018           Hypertension: Taking carvedilol and hydrochlorothiazide  BP Readings from Last 3 Encounters:  04/30/18 128/82  04/24/18 112/70  04/09/18 124/74   HYPOTHYROIDISM: She is being treated with levothyroxine 125 mcg by her PCP  Lab Results  Component Value Date   TSH 1.300 03/10/2018      LABS:  Lab on 04/28/2018  Component Date Value Ref Range Status  . Fructosamine 04/28/2018 264  0 - 285 umol/L Final   Comment: Published reference interval for apparently healthy subjects between age 27 and 97 is 63 - 285 umol/L and in a poorly controlled diabetic population is 228 - 563 umol/L with a mean of 396 umol/L.   [name] Sodium 04/28/2018 136  135 - 145 mEq/L Final  . Potassium 04/28/2018 4.1  3.5 - 5.1 mEq/L Final  . Chloride 04/28/2018 100  96 - 112 mEq/L Final  . CO2 04/28/2018 28  19 - 32 mEq/L Final  .  Glucose, Bld 04/28/2018 134* 70 - 99 mg/dL Final  . BUN 04/28/2018 18  6 - 23 mg/dL Final  .  Creatinine, Ser 04/28/2018 0.79  0.40 - 1.20 mg/dL Final  . Calcium 04/28/2018 10.0  8.4 - 10.5 mg/dL Final  . GFR 04/28/2018 76.42  >60.00 mL/min Final    Physical Examination:  BP 128/82 (BP Location: Right Arm, Patient Position: Sitting, Cuff Size: Large)   Pulse 74   Ht 5\' 2"  (1.575 m)   Wt 222 lb (100.7 kg)   SpO2 98%   BMI 40.60 kg/m       ASSESSMENT:  Diabetes type 2 with morbid obesity, non-insulin-dependent  See history of present illness for detailed discussion of current diabetes management, blood sugar patterns and problems identified  She has had much better blood sugars with weight loss and improved diet along with continuing her walking program This is on metformin only Although she can benefit from [place] or similar agent for reduction of cardiovascular risk she does not want to take any brand-name medications which are too expensive Blood sugars at home are only mildly increased fasting compared to her last visit  HYPERTENSION: Still not on an ACE or ARB agent  Hypothyroidism: Last TSH normal  PLAN:    She will continue metformin alone  If her A1c is excellent which it should be on the next visit she can stay on metformin and follow-up with PCP in the future  Encourage her to continue her diet and exercise regimen which has helped her significantly  Patient Instructions  Check blood sugars on waking up  4/7 days   Also check blood sugars about 2 hours after a meal and do this after different meals by rotation  Recommended blood sugar levels on waking up is 90-130 and about 2 hours after meal is 130-160  Please bring your blood sugar monitor to each visit, thank you      Consultation note has been sent to the referring physician  Elayne Snare 04/30/2018, 4:43 PM   Note: This office note was prepared with Dragon voice recognition system technology. Any  transcriptional errors that result from this process are unintentional.

## 2018-05-01 DIAGNOSIS — Z1231 Encounter for screening mammogram for malignant neoplasm of breast: Secondary | ICD-10-CM | POA: Diagnosis not present

## 2018-05-01 DIAGNOSIS — Z780 Asymptomatic menopausal state: Secondary | ICD-10-CM | POA: Diagnosis not present

## 2018-05-08 ENCOUNTER — Ambulatory Visit (INDEPENDENT_AMBULATORY_CARE_PROVIDER_SITE_OTHER): Payer: PPO | Admitting: Bariatrics

## 2018-05-08 VITALS — BP 110/71 | HR 61 | Temp 98.1°F | Ht 62.0 in | Wt 216.0 lb

## 2018-05-08 DIAGNOSIS — E119 Type 2 diabetes mellitus without complications: Secondary | ICD-10-CM

## 2018-05-08 DIAGNOSIS — Z6839 Body mass index (BMI) 39.0-39.9, adult: Secondary | ICD-10-CM | POA: Diagnosis not present

## 2018-05-08 MED ORDER — METFORMIN HCL ER 750 MG PO TB24: 750.0000 mg | ORAL_TABLET | Freq: Every day | ORAL | 0 refills | Status: DC

## 2018-05-08 NOTE — Progress Notes (Signed)
Office: [phone]  /  Fax: [phone]   HPI:   Chief Complaint: OBESITY Samantha Lozano is here to discuss her progress with her obesity treatment plan. She is on the Category 1 plan and is following her eating plan approximately 95 % of the time. She states she is exercising 40 minutes 6-7 times per week. Samantha Lozano has a total weight loss of 21 lbs. She denies hunger, but notes occasional "brain hunger". She is doing yard and house work and she denies cravings.  Her weight is 216 lb (98 kg) today and has had a weight loss of 4 pounds over a period of 2 weeks since her last visit. She has lost 21 lbs since starting treatment with [name].  Diabetes II Samantha Lozano has a diagnosis of diabetes type II. Samantha Lozano states that she is only taking metformin at this time. She has been on Januvia in the past. She states fasting BGs range between 110 and 130's and post prandial range between 120 and 130's, no lows. She denies any hypoglycemic episodes. She is seeing an Musician. Last A1c was 7.4. She has been working on intensive lifestyle modifications including diet, exercise, and weight loss to help control her blood glucose levels.  ALLERGIES: Allergies  Allergen Reactions  . Lisinopril Cough    MEDICATIONS: Current Outpatient Medications on File Prior to Visit  Medication Sig Dispense Refill  . aspirin EC 81 MG tablet Take 81 mg by mouth daily.    . carvedilol (COREG) 25 MG tablet Take 25 mg by mouth 2 (two) times daily with a meal.    . glucose blood test strip Use as instructed 100 each 0  . hydrochlorothiazide (HYDRODIURIL) 25 MG tablet Take 25 mg by mouth daily.    [name] levothyroxine (SYNTHROID, LEVOTHROID) 125 MCG tablet Take 125 mcg by mouth daily before breakfast.    . lovastatin (MEVACOR) 40 MG tablet Take 40 mg by mouth at bedtime.    . Omega-3 Fatty Acids (FISH OIL) 435 MG CAPS Take 1 capsule by mouth daily at 2 PM.    . Vitamin D, Ergocalciferol, (DRISDOL) 50000 units CAPS capsule Take 1  capsule (50,000 Units total) by mouth every 7 (seven) days. 4 capsule 0   No current facility-administered medications on file prior to visit.     PAST MEDICAL HISTORY: Past Medical History:  Diagnosis Date  . Diabetes mellitus, type 2 ([place])   . HLD (hyperlipidemia)   . HTN (hypertension)   . Leg edema   . Other specified disorders of thyroid     PAST SURGICAL HISTORY: Past Surgical History:  Procedure Laterality Date  . ABDOMINAL HYSTERECTOMY    . THYROID SURGERY      SOCIAL HISTORY: Social History   Tobacco Use  . Smoking status: Never Smoker  . Smokeless tobacco: Never Used  Substance Use Topics  . Alcohol use: Not Currently  . Drug use: Not Currently    FAMILY HISTORY: Family History  Problem Relation Age of Onset  . Diabetes Mother   . Hypertension Mother   . Cancer Mother   . Obesity Mother   . Hypertension Father   . Cancer Father   . Obesity Father     ROS: Review of Systems  Constitutional: Positive for weight loss.  Endo/Heme/Allergies:       Negative hypoglycemia    PHYSICAL EXAM: Blood pressure 110/71, pulse 61, temperature 98.1 F (36.7 C), temperature source Oral, height 5\' 2"  (1.575 m), weight 216 lb (98 kg), SpO2 98 %.  Body mass index is 39.51 kg/m. Physical Exam  Constitutional: She is oriented to person, place, and time. She appears well-developed and well-nourished.  Cardiovascular: Normal rate.  Pulmonary/Chest: Effort normal.  Musculoskeletal: Normal range of motion.  Neurological: She is oriented to person, place, and time.  Skin: Skin is warm and dry.  Psychiatric: She has a normal mood and affect. Her behavior is normal.  Vitals reviewed.   RECENT LABS AND TESTS: BMET    Component Value Date/Time   NA 136 04/28/2018 0941   NA 141 03/10/2018 0839   K 4.1 04/28/2018 0941   CL 100 04/28/2018 0941   CO2 28 04/28/2018 0941   GLUCOSE 134 (H) 04/28/2018 0941   BUN 18 04/28/2018 0941   BUN 12 03/10/2018 0839   CREATININE  0.79 04/28/2018 0941   CALCIUM 10.0 04/28/2018 0941   GFRNONAA 90 03/10/2018 0839   GFRAA 104 03/10/2018 0839   Lab Results  Component Value Date   HGBA1C 7.4 (H) 03/10/2018   Lab Results  Component Value Date   INSULIN 15.0 03/10/2018   CBC    Component Value Date/Time   WBC 6.5 03/10/2018 0839   WBC 10.3 09/24/2007 0515   RBC 4.47 03/10/2018 0839   RBC 3.89 09/24/2007 0515   HGB 12.1 03/10/2018 0839   HCT 37.6 03/10/2018 0839   PLT 208 09/24/2007 0515   MCV 84 03/10/2018 0839   MCH 27.1 03/10/2018 0839   MCHC 32.2 03/10/2018 0839   MCHC 34.6 09/24/2007 0515   RDW 14.7 03/10/2018 0839   LYMPHSABS 2.0 03/10/2018 0839   EOSABS 0.3 03/10/2018 0839   BASOSABS 0.0 03/10/2018 0839   Iron/TIBC/Ferritin/ %Sat No results found for: IRON, TIBC, FERRITIN, IRONPCTSAT Lipid Panel     Component Value Date/Time   CHOL 149 03/10/2018 0839   TRIG 125 03/10/2018 0839   HDL 42 03/10/2018 0839   LDLCALC 82 03/10/2018 0839   Hepatic Function Panel     Component Value Date/Time   PROT 7.2 03/10/2018 0839   ALBUMIN 4.5 03/10/2018 0839   AST 15 03/10/2018 0839   ALT 16 03/10/2018 0839   ALKPHOS 95 03/10/2018 0839   BILITOT 0.5 03/10/2018 0839      Component Value Date/Time   TSH 1.300 03/10/2018 0839    ASSESSMENT AND PLAN: Type 2 diabetes mellitus without complication, without long-term current use of insulin (HCC) - Plan: metFORMIN (GLUCOPHAGE-XR) 750 MG 24 hr tablet  Class 2 severe obesity with serious comorbidity and body mass index (BMI) of 39.0 to 39.9 in adult, unspecified obesity type (Samantha Lozano)  PLAN:  Diabetes II Samantha Lozano has been given extensive diabetes education by myself today including ideal fasting and post-prandial blood glucose readings, individual ideal Hgb A1c goals and hypoglycemia prevention. We discussed the importance of good blood sugar control to decrease the likelihood of diabetic complications such as nephropathy, neuropathy, limb loss, blindness,  coronary artery disease, and death. We discussed the importance of intensive lifestyle modification including diet, exercise and weight loss as the first line treatment for diabetes. Samantha Lozano agrees to continue taking metformin 750 mg 1 tablet PO q AM #30 and we will refill for 1 month; and she is to continue to minimize carbohydrates. Samantha Lozano agrees to follow up with our clinic in 2 weeks with myself or Dr. Leafy Ro.  Obesity Samantha Lozano is currently in the action stage of change. As such, her goal is to continue with weight loss efforts She has agreed to follow the Category 1 plan Samantha Lozano has  been instructed to work up to a goal of 150 minutes of combined cardio and strengthening exercise per week for weight loss and overall health benefits. We discussed the following Behavioral Modification Strategies today: increasing lean protein intake, increasing vegetables, holiday eating strategies, increase H20 intake, and no skipping meals. Samantha Lozano will continue on Category 1, and she will not keep tempting food in the house.  Samantha Lozano has agreed to follow up with our clinic in 2 weeks with myself or Dr. Leafy Ro. She was informed of the importance of frequent follow up visits to maximize her success with intensive lifestyle modifications for her multiple health conditions.   OBESITY BEHAVIORAL INTERVENTION VISIT  Today's visit was # 5   Starting weight: 237 lbs Starting date: 03/10/18 Today's weight : 216 lbs Today's date: 05/08/2018 Total lbs lost to date: 21 At least 15 minutes were spent on discussing the following behavioral intervention visit.   ASK: We discussed the diagnosis of obesity with Samantha Lozano today and Samantha Lozano agreed to give [name] permission to discuss obesity behavioral modification therapy today.  ASSESS: Samantha Lozano has the diagnosis of obesity and her BMI today is 39.5 Samantha Lozano is in the action stage of change   ADVISE: Samantha Lozano was educated on the multiple health risks of obesity as well as  the benefit of weight loss to improve her health. She was advised of the need for long term treatment and the importance of lifestyle modifications to improve her current health and to decrease her risk of future health problems.  AGREE: Multiple dietary modification options and treatment options were discussed and  Samantha Lozano agreed to follow the recommendations documented in the above note.  ARRANGE: Samantha Lozano was educated on the importance of frequent visits to treat obesity as outlined per CMS and USPSTF guidelines and agreed to schedule her next follow up appointment today.  Samantha Lozano, am acting as transcriptionist for [employer], DO  I have reviewed the above documentation for accuracy and completeness, and I agree with the above. -Samantha Lesch, DO

## 2018-05-22 ENCOUNTER — Encounter (INDEPENDENT_AMBULATORY_CARE_PROVIDER_SITE_OTHER): Payer: Self-pay | Admitting: Bariatrics

## 2018-05-22 ENCOUNTER — Ambulatory Visit (INDEPENDENT_AMBULATORY_CARE_PROVIDER_SITE_OTHER): Payer: PPO | Admitting: Bariatrics

## 2018-05-22 VITALS — BP 112/73 | HR 57 | Temp 98.2°F | Ht 62.0 in | Wt 214.0 lb

## 2018-05-22 DIAGNOSIS — E119 Type 2 diabetes mellitus without complications: Secondary | ICD-10-CM | POA: Diagnosis not present

## 2018-05-22 DIAGNOSIS — E559 Vitamin D deficiency, unspecified: Secondary | ICD-10-CM | POA: Diagnosis not present

## 2018-05-22 DIAGNOSIS — K5909 Other constipation: Secondary | ICD-10-CM | POA: Diagnosis not present

## 2018-05-22 DIAGNOSIS — Z6839 Body mass index (BMI) 39.0-39.9, adult: Secondary | ICD-10-CM | POA: Diagnosis not present

## 2018-05-22 MED ORDER — VITAMIN D (ERGOCALCIFEROL) 1.25 MG (50000 UNIT) PO CAPS: 50000.0000 [IU] | ORAL_CAPSULE | ORAL | 0 refills | Status: DC

## 2018-05-27 NOTE — Progress Notes (Signed)
Office: [phone]  /  Fax: [phone]   HPI:   Chief Complaint: OBESITY Samantha Lozano is here to discuss her progress with her obesity treatment plan. She is on the Category 1 plan and is following her eating plan approximately 95 % of the time. She states she is walking 45 minutes 6 times per week. Samantha Lozano is currently struggling with constipation periodically. She is having bowel movements 2 times a week. Sometimes she is having "balls" and occasional large stools. She is drinking a lot of water. Her weight is 214 lb (97.1 kg) today and has had a weight loss of 2 pounds over a period of 2 weeks since her last visit. She has lost 23 lbs since starting treatment with [name].  Vitamin D deficiency Samantha Lozano has a diagnosis of vitamin D deficiency. She is currently taking high dose vit D and denies nausea, vomiting or muscle weakness.  Diabetes II Samantha Lozano has a diagnosis of diabetes type II. She is on metformin only and has been on Januvia in the past. Samantha Lozano states BGs have been labile ranging between 111 and 140 and 2 hour post prandial readings range from 120's to 140. She denies any hypoglycemic episodes. Last A1c was 7.4. She has been working on intensive lifestyle modifications including diet, exercise, and weight loss to help control her blood glucose levels.  Constipation She is having bowel movements 2 times weekly with incomplete evacuation and firm stools.  ALLERGIES: Allergies  Allergen Reactions  . Lisinopril Cough    MEDICATIONS: Current Outpatient Medications on File Prior to Visit  Medication Sig Dispense Refill  . aspirin EC 81 MG tablet Take 81 mg by mouth daily.    . carvedilol (COREG) 25 MG tablet Take 25 mg by mouth 2 (two) times daily with a meal.    . glucose blood test strip Use as instructed 100 each 0  . hydrochlorothiazide (HYDRODIURIL) 25 MG tablet Take 25 mg by mouth daily.    [name] levothyroxine (SYNTHROID, LEVOTHROID) 125 MCG tablet Take 125 mcg by mouth daily before  breakfast.    . lovastatin (MEVACOR) 40 MG tablet Take 40 mg by mouth at bedtime.    . metFORMIN (GLUCOPHAGE-XR) 750 MG 24 hr tablet Take 1 tablet (750 mg total) by mouth daily with breakfast. 30 tablet 0  . Omega-3 Fatty Acids (FISH OIL) 435 MG CAPS Take 1 capsule by mouth daily at 2 PM.     No current facility-administered medications on file prior to visit.     PAST MEDICAL HISTORY: Past Medical History:  Diagnosis Date  . Diabetes mellitus, type 2 ([place])   . HLD (hyperlipidemia)   . HTN (hypertension)   . Leg edema   . Other specified disorders of thyroid     PAST SURGICAL HISTORY: Past Surgical History:  Procedure Laterality Date  . ABDOMINAL HYSTERECTOMY    . THYROID SURGERY      SOCIAL HISTORY: Social History   Tobacco Use  . Smoking status: Never Smoker  . Smokeless tobacco: Never Used  Substance Use Topics  . Alcohol use: Not Currently  . Drug use: Not Currently    FAMILY HISTORY: Family History  Problem Relation Age of Onset  . Diabetes Mother   . Hypertension Mother   . Cancer Mother   . Obesity Mother   . Hypertension Father   . Cancer Father   . Obesity Father     ROS: Review of Systems  Constitutional: Positive for weight loss.  Gastrointestinal: Negative for nausea  and vomiting.  Musculoskeletal:       Negative for muscle weakness.  Endo/Heme/Allergies:       Negative for hypoglycemia.    PHYSICAL EXAM: Blood pressure 112/73, pulse (!) 57, temperature 98.2 F (36.8 C), temperature source Oral, height 5\' 2"  (1.575 m), weight 214 lb (97.1 kg), SpO2 96 %. Body mass index is 39.14 kg/m. Physical Exam  Constitutional: She is oriented to person, place, and time. She appears well-developed and well-nourished.  Cardiovascular: Normal rate.  Pulmonary/Chest: Effort normal.  Musculoskeletal: Normal range of motion.  Neurological: She is oriented to person, place, and time.  Skin: Skin is warm and dry.  Psychiatric: She has a normal mood and  affect. Her behavior is normal.  Vitals reviewed.   RECENT LABS AND TESTS: BMET    Component Value Date/Time   NA 136 04/28/2018 0941   NA 141 03/10/2018 0839   K 4.1 04/28/2018 0941   CL 100 04/28/2018 0941   CO2 28 04/28/2018 0941   GLUCOSE 134 (H) 04/28/2018 0941   BUN 18 04/28/2018 0941   BUN 12 03/10/2018 0839   CREATININE 0.79 04/28/2018 0941   CALCIUM 10.0 04/28/2018 0941   GFRNONAA 90 03/10/2018 0839   GFRAA 104 03/10/2018 0839   Lab Results  Component Value Date   HGBA1C 7.4 (H) 03/10/2018   Lab Results  Component Value Date   INSULIN 15.0 03/10/2018   CBC    Component Value Date/Time   WBC 6.5 03/10/2018 0839   WBC 10.3 09/24/2007 0515   RBC 4.47 03/10/2018 0839   RBC 3.89 09/24/2007 0515   HGB 12.1 03/10/2018 0839   HCT 37.6 03/10/2018 0839   PLT 208 09/24/2007 0515   MCV 84 03/10/2018 0839   MCH 27.1 03/10/2018 0839   MCHC 32.2 03/10/2018 0839   MCHC 34.6 09/24/2007 0515   RDW 14.7 03/10/2018 0839   LYMPHSABS 2.0 03/10/2018 0839   EOSABS 0.3 03/10/2018 0839   BASOSABS 0.0 03/10/2018 0839   Iron/TIBC/Ferritin/ %Sat No results found for: IRON, TIBC, FERRITIN, IRONPCTSAT Lipid Panel     Component Value Date/Time   CHOL 149 03/10/2018 0839   TRIG 125 03/10/2018 0839   HDL 42 03/10/2018 0839   LDLCALC 82 03/10/2018 0839   Hepatic Function Panel     Component Value Date/Time   PROT 7.2 03/10/2018 0839   ALBUMIN 4.5 03/10/2018 0839   AST 15 03/10/2018 0839   ALT 16 03/10/2018 0839   ALKPHOS 95 03/10/2018 0839   BILITOT 0.5 03/10/2018 0839      Component Value Date/Time   TSH 1.300 03/10/2018 0839   Results for JANIYAH, BEERY (MRN [id number]) as of 05/27/2018 07:40  Ref. Range 03/10/2018 08:39  Vitamin D, 25-Hydroxy Latest Ref Range: 30.0 - 100.0 ng/mL 30.2   ASSESSMENT AND PLAN: Vitamin D deficiency - Plan: Vitamin D, Ergocalciferol, (DRISDOL) 50000 units CAPS capsule  Type 2 diabetes mellitus without complication, without long-term  current use of insulin (HCC)  Other constipation  Class 2 severe obesity with serious comorbidity and body mass index (BMI) of 39.0 to 39.9 in adult, unspecified obesity type ([place])  PLAN:  Vitamin D Deficiency Laisha was informed that low vitamin D levels contributes to fatigue and are associated with obesity, breast, and colon cancer. She agrees to continue to take prescription Vit D @50 ,000 IU weekly #4 with no refills and will follow up for routine testing of vitamin D, at least 2-3 times per year. She was informed of the risk  of over-replacement of vitamin D and agrees to not increase her dose unless she discusses this with [name] first. Samantha Lozano agrees to follow up in 2 weeks.  Diabetes II Samantha Lozano has been given extensive diabetes education by myself today including ideal fasting and post-prandial blood glucose readings, individual ideal Hgb A1c goals and hypoglycemia prevention. We discussed the importance of good blood sugar control to decrease the likelihood of diabetic complications such as nephropathy, neuropathy, limb loss, blindness, coronary artery disease, and death. We discussed the importance of intensive lifestyle modification including diet, exercise and weight loss as the first line treatment for diabetes. David agrees to continue to track her FBS and 2 hour post prandial numbers. She will continue metformin 750mg  and will follow up at the agreed upon time.  Constipation Samantha Lozano agrees to take Miralax OTC for 3 times a week and she will take Benefiber in the afternoon with increased water.  Obesity Samantha Lozano is currently in the action stage of change. As such, her goal is to continue with weight loss efforts. She has agreed to follow the Category 1 plan. She will take Benefiber in the afternoon and increase her water intake, which may help with hunger. She was given Recipes and we discussed continuing to increase protein. Samantha Lozano has been instructed to work up to a goal of 150 minutes of  combined cardio and strengthening exercise per week for weight loss and overall health benefits. We discussed the following Behavioral Modification Strategies today: increasing lean protein intake, increasing vegetables, increase H2O intake, decreasing sodium intake, and no skipping meals.  Samantha Lozano has agreed to follow up with our clinic in 2 weeks. She was informed of the importance of frequent follow up visits to maximize her success with intensive lifestyle modifications for her multiple health conditions.   OBESITY BEHAVIORAL INTERVENTION VISIT  Today's visit was # 6 Starting weight: 237 lbs Starting date: 03/10/18 Today's weight : Weight: 214 lb (97.1 kg)  Today's date: 05/22/2018 Total lbs lost to date: 23 At least 15 minutes were spent on discussing the following behavioral intervention visit.   ASK: We discussed the diagnosis of obesity with Samantha Lozano today and Samantha Lozano agreed to give [name] permission to discuss obesity behavioral modification therapy today.  ASSESS: Samantha Lozano has the diagnosis of obesity and her BMI today is 39.13. Samantha Lozano is in the action stage of change.   ADVISE: Samantha Lozano was educated on the multiple health risks of obesity as well as the benefit of weight loss to improve her health. She was advised of the need for long term treatment and the importance of lifestyle modifications to improve her current health and to decrease her risk of future health problems.  AGREE: Multiple dietary modification options and treatment options were discussed and Samantha Lozano agreed to follow the recommendations documented in the above note.  ARRANGE: Samantha Lozano was educated on the importance of frequent visits to treat obesity as outlined per CMS and USPSTF guidelines and agreed to schedule her next follow up appointment today.  I, Marcille Blanco, am acting as Location manager for [employer]. Owens Shark, DO  I have reviewed the above documentation for accuracy and completeness, and I agree with the  above. -Jearld Lesch, DO

## 2018-06-05 ENCOUNTER — Encounter (INDEPENDENT_AMBULATORY_CARE_PROVIDER_SITE_OTHER): Payer: Self-pay | Admitting: Bariatrics

## 2018-06-05 ENCOUNTER — Ambulatory Visit (INDEPENDENT_AMBULATORY_CARE_PROVIDER_SITE_OTHER): Payer: PPO | Admitting: Bariatrics

## 2018-06-05 VITALS — BP 115/59 | HR 57 | Temp 97.8°F | Ht 62.0 in | Wt 208.0 lb

## 2018-06-05 DIAGNOSIS — Z6838 Body mass index (BMI) 38.0-38.9, adult: Secondary | ICD-10-CM | POA: Diagnosis not present

## 2018-06-05 DIAGNOSIS — E119 Type 2 diabetes mellitus without complications: Secondary | ICD-10-CM

## 2018-06-05 DIAGNOSIS — E559 Vitamin D deficiency, unspecified: Secondary | ICD-10-CM | POA: Diagnosis not present

## 2018-06-05 MED ORDER — VITAMIN D (ERGOCALCIFEROL) 1.25 MG (50000 UNIT) PO CAPS: 50000.0000 [IU] | ORAL_CAPSULE | ORAL | 0 refills | Status: DC

## 2018-06-09 NOTE — Progress Notes (Signed)
Office: [phone]  /  Fax: [phone]   HPI:   Chief Complaint: OBESITY Samantha Lozano is here to discuss her progress with her obesity treatment plan. She is on the  follow the Category 1 plan and is following her eating plan approximately 90 % of the time. She states she is exercising by walking for 45 minutes 6 times per week. Samantha Lozano is sticking to her eating plan. She reports her hunger is controlled and no major cravings.   Her weight is 208 lb (94.3 kg) today and has had a weight loss of 6 pounds over a period of 2 weeks since her last visit. She has lost 29 lbs since starting treatment with [name].  Diabetes II Samantha Lozano has a diagnosis of diabetes type II. Samantha Lozano denies any hypoglycemic episodes, polyuria, and polydipsia. Last A1c was 7.4.  She has been working on intensive lifestyle modifications including diet, exercise, and weight loss to help control her blood glucose levels.  Vitamin D deficiency Samantha Lozano has a diagnosis of vitamin D deficiency. She is currently taking vit D and denies nausea, vomiting or muscle weakness.   Ref. Range 03/10/2018 08:39  Vitamin D, 25-Hydroxy Latest Ref Range: 30.0 - 100.0 ng/mL 30.2   ALLERGIES: Allergies  Allergen Reactions  . Lisinopril Cough    MEDICATIONS: Current Outpatient Medications on File Prior to Visit  Medication Sig Dispense Refill  . aspirin EC 81 MG tablet Take 81 mg by mouth daily.    . carvedilol (COREG) 25 MG tablet Take 25 mg by mouth 2 (two) times daily with a meal.    . glucose blood test strip Use as instructed 100 each 0  . hydrochlorothiazide (HYDRODIURIL) 25 MG tablet Take 25 mg by mouth daily.    Samantha Lozano Kitchen levothyroxine (SYNTHROID, LEVOTHROID) 125 MCG tablet Take 125 mcg by mouth daily before breakfast.    . lovastatin (MEVACOR) 40 MG tablet Take 40 mg by mouth at bedtime.    . metFORMIN (GLUCOPHAGE-XR) 750 MG 24 hr tablet Take 1 tablet (750 mg total) by mouth daily with breakfast. 30 tablet 0  . Omega-3 Fatty Acids (FISH OIL)  435 MG CAPS Take 1 capsule by mouth daily at 2 PM.     No current facility-administered medications on file prior to visit.     PAST MEDICAL HISTORY: Past Medical History:  Diagnosis Date  . Diabetes mellitus, type 2 ([place])   . HLD (hyperlipidemia)   . HTN (hypertension)   . Leg edema   . Other specified disorders of thyroid     PAST SURGICAL HISTORY: Past Surgical History:  Procedure Laterality Date  . ABDOMINAL HYSTERECTOMY    . THYROID SURGERY      SOCIAL HISTORY: Social History   Tobacco Use  . Smoking status: Never Smoker  . Smokeless tobacco: Never Used  Substance Use Topics  . Alcohol use: Not Currently  . Drug use: Not Currently    FAMILY HISTORY: Family History  Problem Relation Age of Onset  . Diabetes Mother   . Hypertension Mother   . Cancer Mother   . Obesity Mother   . Hypertension Father   . Cancer Father   . Obesity Father     ROS: Review of Systems  Constitutional: Positive for weight loss.  Gastrointestinal: Negative for nausea and vomiting.  Musculoskeletal:       Negative for muscle weakness  Endo/Heme/Allergies: Negative for polydipsia.       Negative for polyuria Negative for hypoglycemia     PHYSICAL EXAM:  Blood pressure (!) 115/59, pulse (!) 57, temperature 97.8 F (36.6 C), temperature source Oral, height 5\' 2"  (1.575 m), weight 208 lb (94.3 kg), SpO2 97 %. Body mass index is 38.04 kg/m. Physical Exam  Constitutional: She is oriented to person, place, and time. She appears well-developed and well-nourished.  HENT:  Head: Normocephalic.  Cardiovascular: Normal rate.  Pulmonary/Chest: Effort normal.  Musculoskeletal: Normal range of motion.  Neurological: She is alert and oriented to person, place, and time.  Skin: Skin is warm and dry.  Psychiatric: She has a normal mood and affect. Her behavior is normal.  Vitals reviewed.   RECENT LABS AND TESTS: BMET    Component Value Date/Time   NA 136 04/28/2018 0941   NA 141  03/10/2018 0839   K 4.1 04/28/2018 0941   CL 100 04/28/2018 0941   CO2 28 04/28/2018 0941   GLUCOSE 134 (H) 04/28/2018 0941   BUN 18 04/28/2018 0941   BUN 12 03/10/2018 0839   CREATININE 0.79 04/28/2018 0941   CALCIUM 10.0 04/28/2018 0941   GFRNONAA 90 03/10/2018 0839   GFRAA 104 03/10/2018 0839   Lab Results  Component Value Date   HGBA1C 7.4 (H) 03/10/2018   Lab Results  Component Value Date   INSULIN 15.0 03/10/2018   CBC    Component Value Date/Time   WBC 6.5 03/10/2018 0839   WBC 10.3 09/24/2007 0515   RBC 4.47 03/10/2018 0839   RBC 3.89 09/24/2007 0515   HGB 12.1 03/10/2018 0839   HCT 37.6 03/10/2018 0839   PLT 208 09/24/2007 0515   MCV 84 03/10/2018 0839   MCH 27.1 03/10/2018 0839   MCHC 32.2 03/10/2018 0839   MCHC 34.6 09/24/2007 0515   RDW 14.7 03/10/2018 0839   LYMPHSABS 2.0 03/10/2018 0839   EOSABS 0.3 03/10/2018 0839   BASOSABS 0.0 03/10/2018 0839   Iron/TIBC/Ferritin/ %Sat No results found for: IRON, TIBC, FERRITIN, IRONPCTSAT Lipid Panel     Component Value Date/Time   CHOL 149 03/10/2018 0839   TRIG 125 03/10/2018 0839   HDL 42 03/10/2018 0839   LDLCALC 82 03/10/2018 0839   Hepatic Function Panel     Component Value Date/Time   PROT 7.2 03/10/2018 0839   ALBUMIN 4.5 03/10/2018 0839   AST 15 03/10/2018 0839   ALT 16 03/10/2018 0839   ALKPHOS 95 03/10/2018 0839   BILITOT 0.5 03/10/2018 0839      Component Value Date/Time   TSH 1.300 03/10/2018 0839    Ref. Range 03/10/2018 08:39  Vitamin D, 25-Hydroxy Latest Ref Range: 30.0 - 100.0 ng/mL 30.2    ASSESSMENT AND PLAN: Type 2 diabetes mellitus without complication, without long-term current use of insulin (HCC)  Vitamin D deficiency - Plan: Vitamin D, Ergocalciferol, (DRISDOL) 50000 units CAPS capsule  Class 2 severe obesity with serious comorbidity and body mass index (BMI) of 38.0 to 38.9 in adult, unspecified obesity type ([place])  PLAN: Diabetes II Samantha Lozano has been given extensive  diabetes education by myself today including ideal fasting and post-prandial blood glucose readings, individual ideal HgA1c goals  and hypoglycemia prevention. We discussed the importance of good blood sugar control to decrease the likelihood of diabetic complications such as nephropathy, neuropathy, limb loss, blindness, coronary artery disease, and death. We discussed the importance of intensive lifestyle modification including diet, exercise and weight loss as the first line treatment for diabetes. Samantha Lozano agrees to continue her diabetes medications and will follow up at the agreed upon time.  Vitamin D Deficiency  Samantha Lozano was informed that low vitamin D levels contributes to fatigue and are associated with obesity, breast, and colon cancer. She agrees to continue to take prescription Vit D @50 ,000 IU every week #4 with no refills and will follow up for routine testing of vitamin D, at least 2-3 times per year. She was informed of the risk of over-replacement of vitamin D and agrees to not increase her dose unless she discusses this with [name] first. Agrees to follow up with our clinic as directed.   Obesity Samantha Lozano is currently in the action stage of change. As such, her goal is to continue with weight loss efforts She has agreed to follow the Category 1 plan Samantha Lozano has been instructed to work up to a goal of 150 minutes of combined cardio and strengthening exercise per week for weight loss and overall health benefits. We discussed the following Behavioral Modification Strategies today: increasing lean protein intake, decreasing simple carbohydrates , increasing vegetables, decrease eating out, increasing water intake, no skipping meals and work on meal planning and easy cooking plans.    Samantha Lozano has agreed to follow up with our clinic in 2 weeks. She was informed of the importance of frequent follow up visits to maximize her success with intensive lifestyle modifications for her multiple health  conditions.   OBESITY BEHAVIORAL INTERVENTION VISIT  Today's visit was # 7   Starting weight: 237 lb Starting date: 03/10/18 Today's weight : 208 lb  Today's date: 06/05/18 Total lbs lost to date: 29 lb At least 15 minutes were spent on discussing the following behavioral intervention visit.   ASK: We discussed the diagnosis of obesity with Samantha Lozano today and Samantha Lozano agreed to give [name] permission to discuss obesity behavioral modification therapy today.  ASSESS: Samantha Lozano has the diagnosis of obesity and her BMI today is 38.03 Samantha Lozano is in the action stage of change   ADVISE: Samantha Lozano was educated on the multiple health risks of obesity as well as the benefit of weight loss to improve her health. She was advised of the need for long term treatment and the importance of lifestyle modifications to improve her current health and to decrease her risk of future health problems.  AGREE: Multiple dietary modification options and treatment options were discussed and  Samantha Lozano agreed to follow the recommendations documented in the above note.  ARRANGE: Samantha Lozano was educated on the importance of frequent visits to treat obesity as outlined per CMS and USPSTF guidelines and agreed to schedule her next follow up appointment today.  Leary Roca, am acting as transcriptionist for [employer], DO   I have reviewed the above documentation for accuracy and completeness, and I agree with the above. -Jearld Lesch, DO

## 2018-06-13 ENCOUNTER — Other Ambulatory Visit (INDEPENDENT_AMBULATORY_CARE_PROVIDER_SITE_OTHER): Payer: PPO

## 2018-06-13 DIAGNOSIS — E1169 Type 2 diabetes mellitus with other specified complication: Secondary | ICD-10-CM

## 2018-06-13 DIAGNOSIS — E669 Obesity, unspecified: Secondary | ICD-10-CM | POA: Diagnosis not present

## 2018-06-13 LAB — GLUCOSE, RANDOM: Glucose, Bld: 126 mg/dL — ABNORMAL HIGH (ref 70–99)

## 2018-06-13 LAB — HEMOGLOBIN A1C: HEMOGLOBIN A1C: 6.3 % (ref 4.6–6.5)

## 2018-06-16 DIAGNOSIS — D225 Melanocytic nevi of trunk: Secondary | ICD-10-CM | POA: Diagnosis not present

## 2018-06-16 DIAGNOSIS — D0461 Carcinoma in situ of skin of right upper limb, including shoulder: Secondary | ICD-10-CM | POA: Diagnosis not present

## 2018-06-16 DIAGNOSIS — L720 Epidermal cyst: Secondary | ICD-10-CM | POA: Diagnosis not present

## 2018-06-16 DIAGNOSIS — D1801 Hemangioma of skin and subcutaneous tissue: Secondary | ICD-10-CM | POA: Diagnosis not present

## 2018-06-16 DIAGNOSIS — D2371 Other benign neoplasm of skin of right lower limb, including hip: Secondary | ICD-10-CM | POA: Diagnosis not present

## 2018-06-16 DIAGNOSIS — L821 Other seborrheic keratosis: Secondary | ICD-10-CM | POA: Diagnosis not present

## 2018-06-16 DIAGNOSIS — D2261 Melanocytic nevi of right upper limb, including shoulder: Secondary | ICD-10-CM | POA: Diagnosis not present

## 2018-06-16 DIAGNOSIS — L570 Actinic keratosis: Secondary | ICD-10-CM | POA: Diagnosis not present

## 2018-06-17 ENCOUNTER — Ambulatory Visit (INDEPENDENT_AMBULATORY_CARE_PROVIDER_SITE_OTHER): Payer: PPO | Admitting: Endocrinology

## 2018-06-17 ENCOUNTER — Encounter: Payer: Self-pay | Admitting: Endocrinology

## 2018-06-17 VITALS — BP 128/74 | HR 64 | Ht 62.0 in | Wt 210.0 lb

## 2018-06-17 DIAGNOSIS — Z23 Encounter for immunization: Secondary | ICD-10-CM | POA: Diagnosis not present

## 2018-06-17 DIAGNOSIS — E669 Obesity, unspecified: Secondary | ICD-10-CM

## 2018-06-17 DIAGNOSIS — E1169 Type 2 diabetes mellitus with other specified complication: Secondary | ICD-10-CM

## 2018-06-17 NOTE — Progress Notes (Signed)
Patient ID: Samantha Lozano, female   DOB: 06-11-1946, 72 y.o.   MRN: [id number]           Reason for Appointment: Follow-up for Type 2 Diabetes  Referring physician: Blanchie Serve   History of Present Illness:          Date of diagnosis of type 2 diabetes mellitus: ?  2014       Background history:   She does not know when her diabetes was diagnosed no previous records are available Her PCP has been following her diabetes and is being treated with metformin since diagnosis Has not been on any other oral hypoglycemic drug Her A1c done 11/18 was high at 7.6 and previously 7.2  Recent history:      Her A1c is down to 6.3 from 7.4  Non-insulin hypoglycemic drugs the patient is taking are: Metformin ER 750 mg twice daily  Current management, blood sugar patterns and problems identified:  She still has a relatively high fasting blood sugars but overall control is excellent now  She has lost another 12 pounds since her last visit about 2 months ago  This is with her considerably improving her diet and walking as before  Previously recommended Invokana but she wanted to continue only generic medications  She checks blood sugars periodically after lunch and supper and these are excellent with highest postprandial only about 150 although did have a reading of 174 early morning once  Blood sugars are generally lower at bedtime compared to her morning sugars checked around 9 AM  Lab glucose was 126 fasting        Side effects from medications have been: None  Compliance with the medical regimen: Improving   Glucose monitoring:  done about 2 times a day         Glucometer: One Touch.       Blood Glucose readings by time of day and averages from home blood sugar record   PRE-MEAL Fasting Lunch Dinner Bedtime Overall  Glucose range:  129-150      Mean/median:  146     134   POST-MEAL PC Breakfast PC Lunch PC Dinner  Glucose range:    114-152  Mean/median:   130  134      Self-care: The diet that the patient has been following is: tries to limit sweets, carbohydrates and high-fat foods.      Typical meal intake: Breakfast is  usually eggs, cheese and toast             Dietician visit, most recent: 03/2018               Exercise:  Walking in the morning up to 1 hours 6/7 days a week and gardening  Weight history:  Wt Readings from Last 3 Encounters:  06/17/18 210 lb (95.3 kg)  06/05/18 208 lb (94.3 kg)  05/22/18 214 lb (97.1 kg)    Glycemic control:    Lab Results  Component Value Date   HGBA1C 6.3 06/13/2018   HGBA1C 7.4 (H) 03/10/2018   Lab Results  Component Value Date   LDLCALC 82 03/10/2018   CREATININE 0.79 04/28/2018   No results found for: [facility]  Lab Results  Component Value Date   FRUCTOSAMINE 264 04/28/2018      Allergies as of 06/17/2018      Reactions   Lisinopril Cough      Medication List        Accurate as of 06/17/18  8:35  PM. Always use your most recent med list.          aspirin EC 81 MG tablet Take 81 mg by mouth daily.   carvedilol 25 MG tablet Commonly known as:  COREG Take 25 mg by mouth 2 (two) times daily with a meal.   Fish Oil 435 MG Caps Take 1 capsule by mouth daily at 2 PM.   glucose blood test strip Use as instructed   hydrochlorothiazide 25 MG tablet Commonly known as:  HYDRODIURIL Take 25 mg by mouth daily.   levothyroxine 125 MCG tablet Commonly known as:  SYNTHROID, LEVOTHROID Take 125 mcg by mouth daily before breakfast.   lovastatin 40 MG tablet Commonly known as:  MEVACOR Take 40 mg by mouth at bedtime.   metFORMIN 750 MG 24 hr tablet Commonly known as:  GLUCOPHAGE-XR Take 1 tablet (750 mg total) by mouth daily with breakfast.   Vitamin D (Ergocalciferol) 50000 units Caps capsule Commonly known as:  DRISDOL Take 1 capsule (50,000 Units total) by mouth every 7 (seven) days.       Allergies:  Allergies  Allergen Reactions  . Lisinopril Cough     Past Medical History:  Diagnosis Date  . Diabetes mellitus, type 2 ([place])   . HLD (hyperlipidemia)   . HTN (hypertension)   . Leg edema   . Other specified disorders of thyroid     Past Surgical History:  Procedure Laterality Date  . ABDOMINAL HYSTERECTOMY    . THYROID SURGERY      Family History  Problem Relation Age of Onset  . Diabetes Mother   . Hypertension Mother   . Cancer Mother   . Obesity Mother   . Hypertension Father   . Cancer Father   . Obesity Father     Social History:  reports that she has never smoked. She has never used smokeless tobacco. She reports that she drank alcohol. She reports that she has current or past drug history.   Review of Systems   Lipid history: LDL treated with lovastatin    Lab Results  Component Value Date   CHOL 149 03/10/2018   HDL 42 03/10/2018   LDLCALC 82 03/10/2018   TRIG 125 03/10/2018           Hypertension: Taking carvedilol and hydrochlorothiazide  BP Readings from Last 3 Encounters:  06/17/18 128/74  06/05/18 (!) 115/59  05/22/18 112/73   HYPOTHYROIDISM: She is being treated with levothyroxine 125 mcg by her PCP with labs as follows:  Lab Results  Component Value Date   TSH 1.300 03/10/2018      LABS:  Lab on 06/13/2018  Component Date Value Ref Range Status  . Glucose, Bld 06/13/2018 126* 70 - 99 mg/dL Final  . Hgb A1c MFr Bld 06/13/2018 6.3  4.6 - 6.5 % Final   Glycemic Control Guidelines for People with Diabetes:Non Diabetic:  <6%Goal of Therapy: <7%Additional Action Suggested:  >8%     Physical Examination:  BP 128/74   Pulse 64   Ht 5\' 2"  (1.575 m)   Wt 210 lb (95.3 kg)   SpO2 95%   BMI 38.41 kg/m       ASSESSMENT:  Diabetes type 2 with obesity, non-insulin-dependent  See history of present illness for detailed discussion of current diabetes management, blood sugar patterns and problems identified  Her A1c is excellent at 6.3 and significantly better  This is mostly  from her lifestyle changes, weight loss which is progressive and improved  her diet and regular exercise She does have blood sugars averaging over 140 at home in the morning but not consistently Postprandial readings are well controlled also   HYPERTENSION: Controlled although would prefer that she be added an ACE inhibitor   PLAN:    She will continue metformin 750 mg, 2 tablets daily  She was encouraged to continue long-term lifestyle changes and periodic monitoring  She will call if she starts having higher blood sugars consistently  Otherwise can follow-up with PCP now  There are no Patient Instructions on file for this visit.  Influenza vaccine given  Elayne Snare 06/17/2018, 8:35 PM   Note: This office note was prepared with Dragon voice recognition system technology. Any transcriptional errors that result from this process are unintentional.

## 2018-06-19 ENCOUNTER — Encounter (INDEPENDENT_AMBULATORY_CARE_PROVIDER_SITE_OTHER): Payer: Self-pay | Admitting: Bariatrics

## 2018-06-19 ENCOUNTER — Ambulatory Visit (INDEPENDENT_AMBULATORY_CARE_PROVIDER_SITE_OTHER): Payer: PPO | Admitting: Bariatrics

## 2018-06-19 VITALS — BP 148/74 | HR 62 | Temp 97.8°F | Ht 62.0 in | Wt 208.0 lb

## 2018-06-19 DIAGNOSIS — Z6838 Body mass index (BMI) 38.0-38.9, adult: Secondary | ICD-10-CM

## 2018-06-19 DIAGNOSIS — E559 Vitamin D deficiency, unspecified: Secondary | ICD-10-CM | POA: Diagnosis not present

## 2018-06-19 DIAGNOSIS — E119 Type 2 diabetes mellitus without complications: Secondary | ICD-10-CM | POA: Diagnosis not present

## 2018-06-23 NOTE — Progress Notes (Signed)
Office: [phone]  /  Fax: [phone]   HPI:   Chief Complaint: OBESITY Samantha Lozano is here to discuss her progress with her obesity treatment plan. She is on the  follow the Category 1 plan and is following her eating plan approximately 85 % of the time. She states she is exercising by walking for 45 minutes 6 times per week. Samantha Lozano went to Dr. Dwyane Dee (endrocrinologist), instructed to go back to PCP. No changed made in medications. Reports her hunger and cravings are controlled.  Her weight is 208 lb (94.3 kg) today and has not lost weight since her last visit. She has lost 29 lbs since starting treatment with [name].  Samantha Lozano Samantha Lozano has a diagnosis of Samantha type Lozano.  Samantha Lozano states  denies any hypoglycemic episodes, polyuria, and polydipsia. Last A1c was 6.3.  She has been working on intensive lifestyle modifications including diet, exercise, and weight loss to help control her blood glucose levels.  Vitamin Lozano deficiency Samantha Lozano has a diagnosis of vitamin Lozano deficiency. She is currently taking Samantha Lozano and denies nausea, vomiting or muscle weakness.   Ref. Range 03/10/2018 08:39  Vitamin Lozano, 25-Hydroxy Latest Ref Range: 30.0 - 100.0 ng/mL 30.2   ALLERGIES: Allergies  Allergen Reactions  . Lisinopril Cough    MEDICATIONS: Current Outpatient Medications on File Prior to Visit  Medication Sig Dispense Refill  . aspirin EC 81 MG tablet Take 81 mg by mouth daily.    . carvedilol (COREG) 25 MG tablet Take 25 mg by mouth 2 (two) times daily with a meal.    . glucose blood test strip Use as instructed 100 each 0  . hydrochlorothiazide (HYDRODIURIL) 25 MG tablet Take 25 mg by mouth daily.    [name] levothyroxine (SYNTHROID, LEVOTHROID) 125 MCG tablet Take 125 mcg by mouth daily before breakfast.    . lovastatin (MEVACOR) 40 MG tablet Take 40 mg by mouth at bedtime.    . metFORMIN (GLUCOPHAGE-XR) 750 MG 24 hr tablet Take 1 tablet (750 mg total) by mouth daily with breakfast. (Patient taking  differently: Take 750 mg by mouth daily with breakfast. Take 1 tablet twice a day.) 30 tablet 0  . Omega-3 Fatty Acids (FISH OIL) 435 MG CAPS Take 1 capsule by mouth daily at 2 PM.    . Vitamin Lozano, Ergocalciferol, (DRISDOL) 50000 units CAPS capsule Take 1 capsule (50,000 Units total) by mouth every 7 (seven) days. 4 capsule 0   No current facility-administered medications on file prior to visit.     PAST MEDICAL HISTORY: Past Medical History:  Diagnosis Date  . Samantha mellitus, type 2 (Samantha Lozano)   . HLD (hyperlipidemia)   . HTN (hypertension)   . Leg edema   . Other specified disorders of thyroid     PAST SURGICAL HISTORY: Past Surgical History:  Procedure Laterality Date  . ABDOMINAL HYSTERECTOMY    . THYROID SURGERY      SOCIAL HISTORY: Social History   Tobacco Use  . Smoking status: Never Smoker  . Smokeless tobacco: Never Used  Substance Use Topics  . Alcohol use: Not Currently  . Drug use: Not Currently    FAMILY HISTORY: Family History  Problem Relation Age of Onset  . Samantha Mother   . Hypertension Mother   . Cancer Mother   . Obesity Mother   . Hypertension Father   . Cancer Father   . Obesity Father     ROS: Review of Systems  Constitutional: Negative for weight loss.  Gastrointestinal: Negative for nausea and vomiting.  Musculoskeletal:       Negative for muscle weakness  Endo/Heme/Allergies: Negative for polydipsia.       Negative for polyuria and polydipsia    PHYSICAL EXAM: Blood pressure (!) 148/74, pulse 62, temperature 97.8 F (36.6 C), temperature source Oral, height 5\' 2"  (1.575 m), weight 208 lb (94.3 kg), SpO2 96 %. Body mass index is 38.04 kg/m. Physical Exam  Constitutional: She is oriented to person, place, and time. She appears well-developed and well-nourished.  HENT:  Head: Normocephalic.  Neck: Normal range of motion.  Cardiovascular: Normal rate.  Pulmonary/Chest: Effort normal.  Musculoskeletal: Normal range of motion.    Neurological: She is alert and oriented to person, place, and time.  Skin: Skin is warm and dry.  Psychiatric: She has a normal mood and affect. Her behavior is normal.  Vitals reviewed.   RECENT LABS AND TESTS: BMET    Component Value Date/Time   NA 136 04/28/2018 0941   NA 141 03/10/2018 0839   K 4.1 04/28/2018 0941   CL 100 04/28/2018 0941   CO2 28 04/28/2018 0941   GLUCOSE 126 (H) 06/13/2018 0902   BUN 18 04/28/2018 0941   BUN 12 03/10/2018 0839   CREATININE 0.79 04/28/2018 0941   CALCIUM 10.0 04/28/2018 0941   GFRNONAA 90 03/10/2018 0839   GFRAA 104 03/10/2018 0839   Lab Results  Component Value Date   HGBA1C 6.3 06/13/2018   HGBA1C 7.4 (H) 03/10/2018   Lab Results  Component Value Date   INSULIN 15.0 03/10/2018   CBC    Component Value Date/Time   WBC 6.5 03/10/2018 0839   WBC 10.3 09/24/2007 0515   RBC 4.47 03/10/2018 0839   RBC 3.89 09/24/2007 0515   HGB 12.1 03/10/2018 0839   HCT 37.6 03/10/2018 0839   PLT 208 09/24/2007 0515   MCV 84 03/10/2018 0839   MCH 27.1 03/10/2018 0839   MCHC 32.2 03/10/2018 0839   MCHC 34.6 09/24/2007 0515   RDW 14.7 03/10/2018 0839   LYMPHSABS 2.0 03/10/2018 0839   EOSABS 0.3 03/10/2018 0839   BASOSABS 0.0 03/10/2018 0839   Iron/TIBC/Ferritin/ %Sat No results found for: IRON, TIBC, FERRITIN, IRONPCTSAT Lipid Panel     Component Value Date/Time   CHOL 149 03/10/2018 0839   TRIG 125 03/10/2018 0839   HDL 42 03/10/2018 0839   LDLCALC 82 03/10/2018 0839   Hepatic Function Panel     Component Value Date/Time   PROT 7.2 03/10/2018 0839   ALBUMIN 4.5 03/10/2018 0839   AST 15 03/10/2018 0839   ALT 16 03/10/2018 0839   ALKPHOS 95 03/10/2018 0839   BILITOT 0.5 03/10/2018 0839      Component Value Date/Time   TSH 1.300 03/10/2018 0839    Ref. Range 03/10/2018 08:39  Vitamin Lozano, 25-Hydroxy Latest Ref Range: 30.0 - 100.0 ng/mL 30.2    ASSESSMENT AND PLAN: Type 2 Samantha mellitus without complication, without  long-term current use of insulin (HCC)  Vitamin Lozano deficiency  Class 2 severe obesity with serious comorbidity and body mass index (BMI) of 38.0 to 38.9 in adult, unspecified obesity type ([place])  PLAN: Samantha Lozano Samantha Lozano has been given extensive Samantha education by myself today including ideal fasting and post-prandial blood glucose readings, individual ideal HgA1c goals  and hypoglycemia prevention. We discussed the importance of good blood sugar control to decrease the likelihood of diabetic complications such as nephropathy, neuropathy, limb loss, blindness, coronary artery disease, and death. We  discussed the importance of intensive lifestyle modification including diet, exercise and weight loss as the first line treatment for Samantha. Samantha Lozano agrees to continue her Samantha medications and will follow up at the agreed upon time.  Vitamin Lozano Deficiency Samantha Lozano was informed that low vitamin Lozano levels contributes to fatigue and are associated with obesity, breast, and colon cancer. She agrees to continue to take prescription Samantha Lozano @50 ,000 IU every week and will follow up for routine testing of vitamin Lozano, at least 2-3 times per year. She was informed of the risk of over-replacement of vitamin Lozano and agrees to not increase her dose unless she discusses this with [name] first. We will repeat Samantha Lozano level at next visit. Agrees to follow up with our clinic as directed.    Obesity Samantha Lozano is currently in the action stage of change. As such, her goal is to continue with weight loss efforts She has agreed to follow the Category 1 plan Samantha Lozano has been instructed to work up to a goal of 150 minutes of combined cardio and strengthening exercise per week for weight loss and overall health benefits. We discussed the following Behavioral Modification Stratagies today: increasing lean protein intake, decreasing simple carbohydrates , increasing vegetables, decrease eating out, increasing water intake, no skipping meals,  and work on meal planning and easy cooking plans   Samantha Lozano has agreed to follow up with our clinic in 2 weeks. She was informed of the importance of frequent follow up visits to maximize her success with intensive lifestyle modifications for her multiple health conditions.   OBESITY BEHAVIORAL INTERVENTION VISIT  Today's visit was # 8   Starting weight: 237 lb Starting date: 03/10/18 Today's weight : 208 lb Today's date: 06/19/18 Total lbs lost to date: 29 lb At least 15 minutes were spent on discussing the following behavioral intervention visit.   ASK: We discussed the diagnosis of obesity with Samantha Lozano today and Samantha Lozano agreed to give [name] permission to discuss obesity behavioral modification therapy today.  ASSESS: Samantha Lozano has the diagnosis of obesity and her BMI today is 38.03 Samantha Lozano is in the action stage of change   ADVISE: Samantha Lozano was educated on the multiple health risks of obesity as well as the benefit of weight loss to improve her health. She was advised of the need for long term treatment and the importance of lifestyle modifications to improve her current health and to decrease her risk of future health problems.  AGREE: Multiple dietary modification options and treatment options were discussed and  Samantha Lozano agreed to follow the recommendations documented in the above note.  ARRANGE: Samantha Lozano was educated on the importance of frequent visits to treat obesity as outlined per CMS and USPSTF guidelines and agreed to schedule her next follow up appointment today.  Leary Roca, am acting as transcriptionist for [employer], DO   I have reviewed the above documentation for accuracy and completeness, and I agree with the above. -Jearld Lesch, DO

## 2018-07-07 ENCOUNTER — Ambulatory Visit (INDEPENDENT_AMBULATORY_CARE_PROVIDER_SITE_OTHER): Payer: PPO | Admitting: Bariatrics

## 2018-07-07 VITALS — BP 142/84 | HR 53 | Temp 97.6°F | Ht 62.0 in | Wt 204.0 lb

## 2018-07-07 DIAGNOSIS — E119 Type 2 diabetes mellitus without complications: Secondary | ICD-10-CM

## 2018-07-07 DIAGNOSIS — Z6837 Body mass index (BMI) 37.0-37.9, adult: Secondary | ICD-10-CM

## 2018-07-07 DIAGNOSIS — E559 Vitamin D deficiency, unspecified: Secondary | ICD-10-CM | POA: Diagnosis not present

## 2018-07-07 MED ORDER — GLUCOSE BLOOD VI STRP: ORAL_STRIP | 0 refills | Status: DC

## 2018-07-08 NOTE — Progress Notes (Signed)
Office: [phone]  /  Fax: [phone]   HPI:   Chief Complaint: OBESITY Samantha Lozano is here to discuss her progress with her obesity treatment plan. She is on the  follow the Category 1 plan and is following her eating plan approximately 90 % of the time. She states she is exercising 45 minutes 6 times per week. Samantha Lozano is having "occasional brain hunger", she is bored on the rainy days. She does not eat her snacks every day. Samantha Lozano Her weight is 204 lb (92.5 kg) today and has had a weight loss of 4 pounds over a period of 2 weeks since her last visit. She has lost 33 lbs since starting treatment with [name].  Diabetes II Samantha Lozano has a diagnosis of diabetes type II. Samantha Lozano states did not report any blood sugars and denies any hypoglycemic episodes. Last A1c was Hemoglobin A1C Latest Ref Rng & Units 06/13/2018 03/10/2018  HGBA1C 4.6 - 6.5 % 6.3 7.4(H)  Some recent data might be hidden    She has been working on intensive lifestyle modifications including diet, exercise, and weight loss to help control her blood glucose levels. She is currently on Metformin.   Vitamin D deficiency Samantha Lozano has a diagnosis of vitamin D deficiency. She is currently taking vit D and denies nausea, vomiting or muscle weakness.   ALLERGIES: Allergies  Allergen Reactions  . Lisinopril Cough    MEDICATIONS: Current Outpatient Medications on File Prior to Visit  Medication Sig Dispense Refill  . aspirin EC 81 MG tablet Take 81 mg by mouth daily.    . carvedilol (COREG) 25 MG tablet Take 25 mg by mouth 2 (two) times daily with a meal.    . hydrochlorothiazide (HYDRODIURIL) 25 MG tablet Take 25 mg by mouth daily.    Samantha Lozano levothyroxine (SYNTHROID, LEVOTHROID) 125 MCG tablet Take 125 mcg by mouth daily before breakfast.    . lovastatin (MEVACOR) 40 MG tablet Take 40 mg by mouth at bedtime.    . metFORMIN (GLUCOPHAGE-XR) 750 MG 24 hr tablet Take 1 tablet (750 mg total) by mouth daily with breakfast. (Patient taking  differently: Take 750 mg by mouth daily with breakfast. Take 1 tablet twice a day.) 30 tablet 0  . Omega-3 Fatty Acids (FISH OIL) 435 MG CAPS Take 1 capsule by mouth daily at 2 PM.    . Vitamin D, Ergocalciferol, (DRISDOL) 50000 units CAPS capsule Take 1 capsule (50,000 Units total) by mouth every 7 (seven) days. 4 capsule 0   No current facility-administered medications on file prior to visit.     PAST MEDICAL HISTORY: Past Medical History:  Diagnosis Date  . Diabetes mellitus, type 2 ([place])   . HLD (hyperlipidemia)   . HTN (hypertension)   . Leg edema   . Other specified disorders of thyroid     PAST SURGICAL HISTORY: Past Surgical History:  Procedure Laterality Date  . ABDOMINAL HYSTERECTOMY    . THYROID SURGERY      SOCIAL HISTORY: Social History   Tobacco Use  . Smoking status: Never Smoker  . Smokeless tobacco: Never Used  Substance Use Topics  . Alcohol use: Not Currently  . Drug use: Not Currently    FAMILY HISTORY: Family History  Problem Relation Age of Onset  . Diabetes Mother   . Hypertension Mother   . Cancer Mother   . Obesity Mother   . Hypertension Father   . Cancer Father   . Obesity Father     ROS: Review of Systems  Constitutional: Positive for weight loss.  All other systems reviewed and are negative.   PHYSICAL EXAM: Blood pressure (!) 142/84, pulse (!) 53, temperature 97.6 F (36.4 C), temperature source Oral, height 5\' 2"  (1.575 m), weight 204 lb (92.5 kg), SpO2 99 %. Body mass index is 37.31 kg/m. Physical Exam  Constitutional: She is oriented to person, place, and time. She appears well-developed and well-nourished.  Eyes: Pupils are equal, round, and reactive to light.  Neck: Normal range of motion.  Pulmonary/Chest: Effort normal.  Musculoskeletal: Normal range of motion.  Neurological: She is alert and oriented to person, place, and time.  Skin: Skin is warm and dry.  Psychiatric: She has a normal mood and affect. Her  behavior is normal.  Vitals reviewed.   RECENT LABS AND TESTS: BMET    Component Value Date/Time   NA 136 04/28/2018 0941   NA 141 03/10/2018 0839   K 4.1 04/28/2018 0941   CL 100 04/28/2018 0941   CO2 28 04/28/2018 0941   GLUCOSE 126 (H) 06/13/2018 0902   BUN 18 04/28/2018 0941   BUN 12 03/10/2018 0839   CREATININE 0.79 04/28/2018 0941   CALCIUM 10.0 04/28/2018 0941   GFRNONAA 90 03/10/2018 0839   GFRAA 104 03/10/2018 0839   Lab Results  Component Value Date   HGBA1C 6.3 06/13/2018   HGBA1C 7.4 (H) 03/10/2018   Lab Results  Component Value Date   INSULIN 15.0 03/10/2018   CBC    Component Value Date/Time   WBC 6.5 03/10/2018 0839   WBC 10.3 09/24/2007 0515   RBC 4.47 03/10/2018 0839   RBC 3.89 09/24/2007 0515   HGB 12.1 03/10/2018 0839   HCT 37.6 03/10/2018 0839   PLT 208 09/24/2007 0515   MCV 84 03/10/2018 0839   MCH 27.1 03/10/2018 0839   MCHC 32.2 03/10/2018 0839   MCHC 34.6 09/24/2007 0515   RDW 14.7 03/10/2018 0839   LYMPHSABS 2.0 03/10/2018 0839   EOSABS 0.3 03/10/2018 0839   BASOSABS 0.0 03/10/2018 0839   Iron/TIBC/Ferritin/ %Sat No results found for: IRON, TIBC, FERRITIN, IRONPCTSAT Lipid Panel     Component Value Date/Time   CHOL 149 03/10/2018 0839   TRIG 125 03/10/2018 0839   HDL 42 03/10/2018 0839   LDLCALC 82 03/10/2018 0839   Hepatic Function Panel     Component Value Date/Time   PROT 7.2 03/10/2018 0839   ALBUMIN 4.5 03/10/2018 0839   AST 15 03/10/2018 0839   ALT 16 03/10/2018 0839   ALKPHOS 95 03/10/2018 0839   BILITOT 0.5 03/10/2018 0839      Component Value Date/Time   TSH 1.300 03/10/2018 0839    ASSESSMENT AND PLAN: Type 2 diabetes mellitus without complication, without long-term current use of insulin (HCC) - Plan: glucose blood test strip  Vitamin D deficiency  Class 2 severe obesity with serious comorbidity and body mass index (BMI) of 37.0 to 37.9 in adult, unspecified obesity type ([place])  PLAN: Diabetes  II Glennis has been given extensive diabetes education by myself today including ideal fasting and post-prandial blood glucose readings, individual ideal HgA1c goals  and hypoglycemia prevention. We discussed the importance of good blood sugar control to decrease the likelihood of diabetic complications such as nephropathy, neuropathy, limb loss, blindness, coronary artery disease, and death. We discussed the importance of intensive lifestyle modification including diet, exercise and weight loss as the first line treatment for diabetes. Samantha Lozano agrees to continue her diabetes medications and will follow up at the agreed  upon time. Refilled Glucose test strips checking twice daily with #100 and 0 refills.   Vitamin D Deficiency Samantha Lozano was informed that low vitamin D levels contributes to fatigue and are associated with obesity, breast, and colon cancer. She agrees to continue to take prescription Vit D @50 ,000 IU every week and will follow up for routine testing of vitamin D, at least 2-3 times per year. She was informed of the risk of over-replacement of vitamin D and agrees to not increase her dose unless she discusses this with [name] first.  Obesity Any is currently in the action stage of change. As such, her goal is to continue with weight loss efforts She has agreed to follow the Category 1 plan Samantha Lozano has been instructed to work up to a goal of 150 minutes of combined cardio and strengthening exercise per week for weight loss and overall health benefits.We discussed appropriate snacking and handout given for "on the road". We discussed the following Behavioral Modification Stratagies today: increasing lean protein intake, decreasing simple carbohydrates , increasing vegetables, decrease eating out and work on meal planning and easy cooking plans, no skipping meals, increase H20 intake and keeping health food in the home.    Samantha Lozano has agreed to follow up with our clinic in 2 weeks. She was informed  of the importance of frequent follow up visits to maximize her success with intensive lifestyle modifications for her multiple health conditions.   OBESITY BEHAVIORAL INTERVENTION VISIT  Today's visit was # 9   Starting weight: 237 lbs Starting date: 03/10/18 Today's weight : Weight: 204 lb (92.5 kg)  Today's date: 07/07/18 Total lbs lost to date: 33 At least 15 minutes were spent on discussing the following behavioral interventions:   ASK: We discussed the diagnosis of obesity with Samantha Lozano today and Samantha Lozano agreed to give [name] permission to discuss obesity behavioral modification therapy today.  ASSESS: Samantha Lozano has the diagnosis of obesity and her BMI today is 37.5 Samantha Lozano is in the action stage of change   ADVISE: Samantha Lozano was educated on the multiple health risks of obesity as well as the benefit of weight loss to improve her health. She was advised of the need for long term treatment and the importance of lifestyle modifications to improve her current health and to decrease her risk of future health problems.  AGREE: Multiple dietary modification options and treatment options were discussed and  Samantha Lozano agreed to follow the recommendations documented in the above note.  ARRANGE: Samantha Lozano was educated on the importance of frequent visits to treat obesity as outlined per CMS and USPSTF guidelines and agreed to schedule her next follow up appointment today.  I, April Moore, am acting as transcriptionist for Dr Jearld Lesch.   I have reviewed the above documentation for accuracy and completeness, and I agree with the above. -Jearld Lesch, DO

## 2018-07-14 ENCOUNTER — Other Ambulatory Visit (INDEPENDENT_AMBULATORY_CARE_PROVIDER_SITE_OTHER): Payer: Self-pay

## 2018-07-14 ENCOUNTER — Telehealth (INDEPENDENT_AMBULATORY_CARE_PROVIDER_SITE_OTHER): Payer: Self-pay | Admitting: Bariatrics

## 2018-07-14 DIAGNOSIS — E559 Vitamin D deficiency, unspecified: Secondary | ICD-10-CM

## 2018-07-14 MED ORDER — VITAMIN D (ERGOCALCIFEROL) 1.25 MG (50000 UNIT) PO CAPS: 50000.0000 [IU] | ORAL_CAPSULE | ORAL | 0 refills | Status: DC

## 2018-07-24 ENCOUNTER — Encounter (INDEPENDENT_AMBULATORY_CARE_PROVIDER_SITE_OTHER): Payer: Self-pay | Admitting: Bariatrics

## 2018-07-24 ENCOUNTER — Ambulatory Visit (INDEPENDENT_AMBULATORY_CARE_PROVIDER_SITE_OTHER): Payer: PPO | Admitting: Bariatrics

## 2018-07-24 VITALS — BP 170/76 | HR 49 | Temp 97.5°F | Ht 62.0 in | Wt 196.0 lb

## 2018-07-24 DIAGNOSIS — E559 Vitamin D deficiency, unspecified: Secondary | ICD-10-CM

## 2018-07-24 DIAGNOSIS — E119 Type 2 diabetes mellitus without complications: Secondary | ICD-10-CM

## 2018-07-24 DIAGNOSIS — Z6836 Body mass index (BMI) 36.0-36.9, adult: Secondary | ICD-10-CM | POA: Diagnosis not present

## 2018-07-24 MED ORDER — VITAMIN D (ERGOCALCIFEROL) 1.25 MG (50000 UNIT) PO CAPS: 50000.0000 [IU] | ORAL_CAPSULE | ORAL | 0 refills | Status: DC

## 2018-07-25 LAB — COMPREHENSIVE METABOLIC PANEL
ALBUMIN: 4.7 g/dL (ref 3.5–4.8)
ALT: 11 IU/L (ref 0–32)
AST: 13 IU/L (ref 0–40)
Albumin/Globulin Ratio: 1.7 (ref 1.2–2.2)
Alkaline Phosphatase: 86 IU/L (ref 39–117)
BILIRUBIN TOTAL: 0.6 mg/dL (ref 0.0–1.2)
BUN / CREAT RATIO: 27 (ref 12–28)
BUN: 20 mg/dL (ref 8–27)
CO2: 25 mmol/L (ref 20–29)
Calcium: 9.8 mg/dL (ref 8.7–10.3)
Chloride: 100 mmol/L (ref 96–106)
Creatinine, Ser: 0.74 mg/dL (ref 0.57–1.00)
GFR calc Af Amer: 95 mL/min/{1.73_m2} (ref >59)
GFR calc non Af Amer: 82 mL/min/{1.73_m2} (ref >59)
GLOBULIN, TOTAL: 2.7 g/dL (ref 1.5–4.5)
Glucose: 117 mg/dL — ABNORMAL HIGH (ref 65–99)
POTASSIUM: 4.4 mmol/L (ref 3.5–5.2)
SODIUM: 140 mmol/L (ref 134–144)
Total Protein: 7.4 g/dL (ref 6.0–8.5)

## 2018-07-25 LAB — VITAMIN D 25 HYDROXY (VIT D DEFICIENCY, FRACTURES): Vit D, 25-Hydroxy: 56.8 ng/mL (ref 30.0–100.0)

## 2018-07-29 NOTE — Progress Notes (Signed)
Office: [phone]  /  Fax: [phone]   HPI:   Chief Complaint: OBESITY Samantha Lozano is here to discuss her progress with her obesity treatment plan. She is on the Category 1 plan and is following her eating plan approximately 90 % of the time. She states she is walking 45 minutes 6 times per week. Samantha Lozano has reached her weight under 200 pounds goal. She is doing well and she has been sticking to her diet.  Her weight is 196 lb (88.9 kg) today and has had a weight loss of 8 pounds over a period of 2 weeks since her last visit. She has lost 41 lbs since starting treatment with [name].  Vitamin D deficiency Samantha Lozano has a diagnosis of vitamin D deficiency. She is currently taking high dose vit D and denies nausea, vomiting, or muscle weakness.  Diabetes II Samantha Lozano has a diagnosis of diabetes type II. Samantha Lozano states that her fasting BG's range between 110 and 120's and the is taking metformin 750mg .  Last A1c was 6.3 on 06/13/18. She has been working on intensive lifestyle modifications including diet, exercise, and weight loss to help control her blood glucose levels.  ALLERGIES: Allergies  Allergen Reactions  . Lisinopril Cough    MEDICATIONS: Current Outpatient Medications on File Prior to Visit  Medication Sig Dispense Refill  . aspirin EC 81 MG tablet Take 81 mg by mouth daily.    . carvedilol (COREG) 25 MG tablet Take 25 mg by mouth 2 (two) times daily with a meal.    . glucose blood test strip Check BS twice daily 100 each 0  . hydrochlorothiazide (HYDRODIURIL) 25 MG tablet Take 25 mg by mouth daily.    [name] levothyroxine (SYNTHROID, LEVOTHROID) 125 MCG tablet Take 125 mcg by mouth daily before breakfast.    . lovastatin (MEVACOR) 40 MG tablet Take 40 mg by mouth at bedtime.    . metFORMIN (GLUCOPHAGE-XR) 750 MG 24 hr tablet Take 1 tablet (750 mg total) by mouth daily with breakfast. (Patient taking differently: Take 750 mg by mouth daily with breakfast. Take 1 tablet twice a day.) 30  tablet 0  . Omega-3 Fatty Acids (FISH OIL) 435 MG CAPS Take 1 capsule by mouth daily at 2 PM.     No current facility-administered medications on file prior to visit.     PAST MEDICAL HISTORY: Past Medical History:  Diagnosis Date  . Diabetes mellitus, type 2 ([place])   . HLD (hyperlipidemia)   . HTN (hypertension)   . Leg edema   . Other specified disorders of thyroid     PAST SURGICAL HISTORY: Past Surgical History:  Procedure Laterality Date  . ABDOMINAL HYSTERECTOMY    . THYROID SURGERY      SOCIAL HISTORY: Social History   Tobacco Use  . Smoking status: Never Smoker  . Smokeless tobacco: Never Used  Substance Use Topics  . Alcohol use: Not Currently  . Drug use: Not Currently    FAMILY HISTORY: Family History  Problem Relation Age of Onset  . Diabetes Mother   . Hypertension Mother   . Cancer Mother   . Obesity Mother   . Hypertension Father   . Cancer Father   . Obesity Father     ROS: Review of Systems  Constitutional: Positive for weight loss.  Gastrointestinal: Negative for nausea and vomiting.  Musculoskeletal:       Negative for muscle weakness.    PHYSICAL EXAM: Blood pressure (!) 170/76, pulse (!) 49, temperature (!)  97.5 F (36.4 C), temperature source Oral, height 5\' 2"  (1.575 m), weight 196 lb (88.9 kg), SpO2 97 %. Body mass index is 35.85 kg/m. Physical Exam  Constitutional: She is oriented to person, place, and time. She appears well-developed and well-nourished.  Cardiovascular: Normal rate.  Pulmonary/Chest: Effort normal.  Musculoskeletal: Normal range of motion.  Neurological: She is oriented to person, place, and time.  Skin: Skin is warm and dry.  Psychiatric: She has a normal mood and affect. Her behavior is normal.  Vitals reviewed.   RECENT LABS AND TESTS: BMET    Component Value Date/Time   NA 140 07/24/2018 1003   K 4.4 07/24/2018 1003   CL 100 07/24/2018 1003   CO2 25 07/24/2018 1003   GLUCOSE 117 (H) 07/24/2018  1003   GLUCOSE 126 (H) 06/13/2018 0902   BUN 20 07/24/2018 1003   CREATININE 0.74 07/24/2018 1003   CALCIUM 9.8 07/24/2018 1003   GFRNONAA 82 07/24/2018 1003   GFRAA 95 07/24/2018 1003   Lab Results  Component Value Date   HGBA1C 6.3 06/13/2018   HGBA1C 7.4 (H) 03/10/2018   Lab Results  Component Value Date   INSULIN 15.0 03/10/2018   CBC    Component Value Date/Time   WBC 6.5 03/10/2018 0839   WBC 10.3 09/24/2007 0515   RBC 4.47 03/10/2018 0839   RBC 3.89 09/24/2007 0515   HGB 12.1 03/10/2018 0839   HCT 37.6 03/10/2018 0839   PLT 208 09/24/2007 0515   MCV 84 03/10/2018 0839   MCH 27.1 03/10/2018 0839   MCHC 32.2 03/10/2018 0839   MCHC 34.6 09/24/2007 0515   RDW 14.7 03/10/2018 0839   LYMPHSABS 2.0 03/10/2018 0839   EOSABS 0.3 03/10/2018 0839   BASOSABS 0.0 03/10/2018 0839   Iron/TIBC/Ferritin/ %Sat No results found for: IRON, TIBC, FERRITIN, IRONPCTSAT Lipid Panel     Component Value Date/Time   CHOL 149 03/10/2018 0839   TRIG 125 03/10/2018 0839   HDL 42 03/10/2018 0839   LDLCALC 82 03/10/2018 0839   Hepatic Function Panel     Component Value Date/Time   PROT 7.4 07/24/2018 1003   ALBUMIN 4.7 07/24/2018 1003   AST 13 07/24/2018 1003   ALT 11 07/24/2018 1003   ALKPHOS 86 07/24/2018 1003   BILITOT 0.6 07/24/2018 1003      Component Value Date/Time   TSH 1.300 03/10/2018 0839   Results for Samantha Lozano, MAMONE (MRN [id number]) as of 07/29/2018 16:01  Ref. Range 07/24/2018 10:03  Vitamin D, 25-Hydroxy Latest Ref Range: 30.0 - 100.0 ng/mL 56.8   ASSESSMENT AND PLAN: Vitamin D deficiency - Plan: Comprehensive metabolic panel, VITAMIN D 25 Hydroxy (Vit-D Deficiency, Fractures), Vitamin D, Ergocalciferol, (DRISDOL) 1.25 MG (50000 UT) CAPS capsule  Type 2 diabetes mellitus without complication, without long-term current use of insulin (HCC)  Class 2 severe obesity with serious comorbidity and body mass index (BMI) of 36.0 to 36.9 in adult, unspecified obesity  type (HCC)  PLAN:  Vitamin D Deficiency Margree was informed that low vitamin D levels contributes to fatigue and are associated with obesity, breast, and colon cancer. She agrees to continue to take prescription Vit D @50 ,000 IU weekly  #4 with no refills and will follow up for routine testing of vitamin D, at least 2-3 times per year. She was informed of the risk of over-replacement of vitamin D and agrees to not increase her dose unless she discusses this with [name] first. Makisha agrees to follow up in 2 weeks.  Diabetes II Jakiera has been given extensive diabetes education by myself today including ideal fasting and post-prandial blood glucose readings, individual ideal Hgb A1c goals, and hypoglycemia prevention. We discussed the importance of good blood sugar control to decrease the likelihood of diabetic complications such as nephropathy, neuropathy, limb loss, blindness, coronary artery disease, and death. We discussed the importance of intensive lifestyle modification including diet, exercise and weight loss as the first line treatment for diabetes. Ludy agrees to continue her metformin and reducing carbohydrates and increasing protein in her diet. She will follow up at the agreed upon time.  Obesity Kaoir is currently in the action stage of change. As such, her goal is to continue with weight loss efforts. She has agreed to follow the Category 1 plan. Ariea has been instructed to work up to a goal of 150 minutes of combined cardio and strengthening exercise per week for weight loss and overall health benefits. We discussed the following Behavioral Modification Strategies today: increasing lean protein intake, decreasing simple carbohydrates, increasing vegetables, increase H2O intake, decrease eating out, no skipping meals, and work on meal planning and easy cooking plans.  Braxtyn has agreed to follow up with our clinic in 2 weeks. She was informed of the importance of frequent follow up  visits to maximize her success with intensive lifestyle modifications for her multiple health conditions.   OBESITY BEHAVIORAL INTERVENTION VISIT  Today's visit was # 10   Starting weight: 237 lbs Starting date: 03/10/18 Today's weight : Weight: 196 lb (88.9 kg)  Today's date: 07/24/2018 Total lbs lost to date: 41 At least 15 minutes were spent on discussing the following behavioral intervention visit.  ASK: We discussed the diagnosis of obesity with [name] today and Anahita agreed to give [name] permission to discuss obesity behavioral modification therapy today.  ASSESS: Peggi has the diagnosis of obesity and her BMI today is 35.84. Yuleni is in the action stage of change.   ADVISE: Adlyn was educated on the multiple health risks of obesity as well as the benefit of weight loss to improve her health. She was advised of the need for long term treatment and the importance of lifestyle modifications to improve her current health and to decrease her risk of future health problems.  AGREE: Multiple dietary modification options and treatment options were discussed and Mycala agreed to follow the recommendations documented in the above note.  ARRANGE: Mecca was educated on the importance of frequent visits to treat obesity as outlined per CMS and USPSTF guidelines and agreed to schedule her next follow up appointment today.  I, Marcille Blanco, am acting as Location manager for [employer]. Owens Shark, DO  I have reviewed the above documentation for accuracy and completeness, and I agree with the above. -Jearld Lesch, DO

## 2018-07-30 DIAGNOSIS — Z6836 Body mass index (BMI) 36.0-36.9, adult: Secondary | ICD-10-CM

## 2018-07-31 DIAGNOSIS — Z0000 Encounter for general adult medical examination without abnormal findings: Secondary | ICD-10-CM | POA: Diagnosis not present

## 2018-07-31 DIAGNOSIS — E039 Hypothyroidism, unspecified: Secondary | ICD-10-CM | POA: Diagnosis not present

## 2018-07-31 DIAGNOSIS — E1159 Type 2 diabetes mellitus with other circulatory complications: Secondary | ICD-10-CM | POA: Diagnosis not present

## 2018-07-31 DIAGNOSIS — I10 Essential (primary) hypertension: Secondary | ICD-10-CM | POA: Diagnosis not present

## 2018-07-31 DIAGNOSIS — E782 Mixed hyperlipidemia: Secondary | ICD-10-CM | POA: Diagnosis not present

## 2018-07-31 DIAGNOSIS — Z1211 Encounter for screening for malignant neoplasm of colon: Secondary | ICD-10-CM | POA: Diagnosis not present

## 2018-08-11 ENCOUNTER — Encounter (INDEPENDENT_AMBULATORY_CARE_PROVIDER_SITE_OTHER): Payer: Self-pay | Admitting: Bariatrics

## 2018-08-11 ENCOUNTER — Ambulatory Visit (INDEPENDENT_AMBULATORY_CARE_PROVIDER_SITE_OTHER): Payer: PPO | Admitting: Bariatrics

## 2018-08-11 VITALS — BP 150/80 | HR 59 | Temp 97.6°F | Ht 62.0 in | Wt 193.0 lb

## 2018-08-11 DIAGNOSIS — E119 Type 2 diabetes mellitus without complications: Secondary | ICD-10-CM

## 2018-08-11 DIAGNOSIS — Z6835 Body mass index (BMI) 35.0-35.9, adult: Secondary | ICD-10-CM | POA: Diagnosis not present

## 2018-08-11 DIAGNOSIS — E559 Vitamin D deficiency, unspecified: Secondary | ICD-10-CM

## 2018-08-11 DIAGNOSIS — I10 Essential (primary) hypertension: Secondary | ICD-10-CM

## 2018-08-11 MED ORDER — VITAMIN D (ERGOCALCIFEROL) 1.25 MG (50000 UNIT) PO CAPS: 50000.0000 [IU] | ORAL_CAPSULE | ORAL | 0 refills | Status: DC

## 2018-08-13 NOTE — Progress Notes (Signed)
Office: [phone]  /  Fax: [phone]   HPI:   Chief Complaint: OBESITY Samantha Lozano is here to discuss her progress with her obesity treatment plan. She is following the Category 1 plan and is following her eating plan approximately 90 % of the time. She states she is walking 45 minutes 6 times per week. Samantha Lozano is doing well with the diet plan. It was however somewhat hard during the holidays.  Her weight is 193 lb (87.5 kg) today and has had a weight loss of 3 pounds over a period of 2 weeks since her last visit. She has lost 3 lbs since starting treatment with [name].  Vitamin D deficiency Samantha Lozano has a diagnosis of vitamin D deficiency. She is currently taking prescription Vit D and denies nausea, vomiting or muscle weakness.  Diabetes II Samantha Lozano has a diagnosis of diabetes type II. Samantha Lozano states fasting BGs range between 100 and 115 and post prandial readings 120-125. She denies any hypoglycemic episodes. Last A1c was Hemoglobin A1C Latest Ref Rng & Units 06/13/2018 03/10/2018  HGBA1C 4.6 - 6.5 % 6.3 7.4(H)  Some recent data might be hidden    She has been working on intensive lifestyle modifications including diet, exercise, and weight loss to help control her blood glucose levels.  Hypertension Samantha Lozano is a 72 y.o. female with hypertension.  Samantha Lozano denies chest pain or shortness of breath on exertion. She is working weight loss to help control her blood pressure with the goal of decreasing her risk of heart attack and stroke. Berthas blood pressure is slightly elevated today on Carvedilol.     ALLERGIES: Allergies  Allergen Reactions  . Lisinopril Cough    MEDICATIONS: Current Outpatient Medications on File Prior to Visit  Medication Sig Dispense Refill  . aspirin EC 81 MG tablet Take 81 mg by mouth daily.    . carvedilol (COREG) 25 MG tablet Take 25 mg by mouth 2 (two) times daily with a meal.    . glucose blood test strip Check BS twice daily 100 each 0  .  hydrochlorothiazide (HYDRODIURIL) 25 MG tablet Take 25 mg by mouth daily.    [name] levothyroxine (SYNTHROID, LEVOTHROID) 125 MCG tablet Take 125 mcg by mouth daily before breakfast.    . lovastatin (MEVACOR) 40 MG tablet Take 40 mg by mouth at bedtime.    . metFORMIN (GLUCOPHAGE-XR) 750 MG 24 hr tablet Take 1 tablet (750 mg total) by mouth daily with breakfast. (Patient taking differently: Take 750 mg by mouth daily with breakfast. Take 1 tablet twice a day.) 30 tablet 0  . Omega-3 Fatty Acids (FISH OIL) 435 MG CAPS Take 1 capsule by mouth daily at 2 PM.     No current facility-administered medications on file prior to visit.     PAST MEDICAL HISTORY: Past Medical History:  Diagnosis Date  . Diabetes mellitus, type 2 ([place])   . HLD (hyperlipidemia)   . HTN (hypertension)   . Leg edema   . Other specified disorders of thyroid     PAST SURGICAL HISTORY: Past Surgical History:  Procedure Laterality Date  . ABDOMINAL HYSTERECTOMY    . THYROID SURGERY      SOCIAL HISTORY: Social History   Tobacco Use  . Smoking status: Never Smoker  . Smokeless tobacco: Never Used  Substance Use Topics  . Alcohol use: Not Currently  . Drug use: Not Currently    FAMILY HISTORY: Family History  Problem Relation Age of Onset  .  Diabetes Mother   . Hypertension Mother   . Cancer Mother   . Obesity Mother   . Hypertension Father   . Cancer Father   . Obesity Father     ROS: Review of Systems  Constitutional: Positive for malaise/fatigue and weight loss.  Respiratory: Negative for shortness of breath.   Cardiovascular: Negative for chest pain.  Gastrointestinal: Negative for nausea and vomiting.  Musculoskeletal:       Negative for muscle weakness  Endo/Heme/Allergies:       Negative for hypoglycemia    PHYSICAL EXAM: Blood pressure (!) (P) 150/80, pulse (!) 59, temperature 97.6 F (36.4 C), temperature source Oral, height 5\' 2"  (1.575 m), weight 193 lb (87.5 kg), SpO2 100 %. Body mass  index is 35.3 kg/m. Physical Exam  Constitutional: She is oriented to person, place, and time. She appears well-developed and well-nourished.  Cardiovascular: Normal rate.  Pulmonary/Chest: Effort normal.  Musculoskeletal: Normal range of motion.  Neurological: She is alert and oriented to person, place, and time.  Skin: Skin is warm and dry.  Psychiatric: She has a normal mood and affect. Her behavior is normal.  Vitals reviewed.   RECENT LABS AND TESTS: BMET    Component Value Date/Time   NA 140 07/24/2018 1003   K 4.4 07/24/2018 1003   CL 100 07/24/2018 1003   CO2 25 07/24/2018 1003   GLUCOSE 117 (H) 07/24/2018 1003   GLUCOSE 126 (H) 06/13/2018 0902   BUN 20 07/24/2018 1003   CREATININE 0.74 07/24/2018 1003   CALCIUM 9.8 07/24/2018 1003   GFRNONAA 82 07/24/2018 1003   GFRAA 95 07/24/2018 1003   Lab Results  Component Value Date   HGBA1C 6.3 06/13/2018   HGBA1C 7.4 (H) 03/10/2018   Lab Results  Component Value Date   INSULIN 15.0 03/10/2018   CBC    Component Value Date/Time   WBC 6.5 03/10/2018 0839   WBC 10.3 09/24/2007 0515   RBC 4.47 03/10/2018 0839   RBC 3.89 09/24/2007 0515   HGB 12.1 03/10/2018 0839   HCT 37.6 03/10/2018 0839   PLT 208 09/24/2007 0515   MCV 84 03/10/2018 0839   MCH 27.1 03/10/2018 0839   MCHC 32.2 03/10/2018 0839   MCHC 34.6 09/24/2007 0515   RDW 14.7 03/10/2018 0839   LYMPHSABS 2.0 03/10/2018 0839   EOSABS 0.3 03/10/2018 0839   BASOSABS 0.0 03/10/2018 0839   Iron/TIBC/Ferritin/ %Sat No results found for: IRON, TIBC, FERRITIN, IRONPCTSAT Lipid Panel     Component Value Date/Time   CHOL 149 03/10/2018 0839   TRIG 125 03/10/2018 0839   HDL 42 03/10/2018 0839   LDLCALC 82 03/10/2018 0839   Hepatic Function Panel     Component Value Date/Time   PROT 7.4 07/24/2018 1003   ALBUMIN 4.7 07/24/2018 1003   AST 13 07/24/2018 1003   ALT 11 07/24/2018 1003   ALKPHOS 86 07/24/2018 1003   BILITOT 0.6 07/24/2018 1003        Component Value Date/Time   TSH 1.300 03/10/2018 0839   Results for TINEY, ZIPPER (MRN [id number]) as of 08/13/2018 16:40  Ref. Range 07/24/2018 10:03  Vitamin D, 25-Hydroxy Latest Ref Range: 30.0 - 100.0 ng/mL 56.8   ASSESSMENT AND PLAN: Vitamin D deficiency - Plan: Vitamin D, Ergocalciferol, (DRISDOL) 1.25 MG (50000 UT) CAPS capsule  Type 2 diabetes mellitus without complication, without long-term current use of insulin (HCC)  Essential hypertension  Class 2 severe obesity with serious comorbidity and body mass index (BMI)  of 35.0 to 35.9 in adult, unspecified obesity type ([place])  PLAN: Vitamin D Deficiency Samantha Lozano was informed that low vitamin D levels contributes to fatigue and are associated with obesity, breast, and colon cancer. She agrees to continue to take prescription Vit D @50 ,000 IU every week #4 with no refills. She will follow up for routine testing of vitamin D, at least 2-3 times per year. She was informed of the risk of over-replacement of vitamin D and agrees to not increase her dose unless she discusses this with [name] first. Holiday agrees to follow up with our office in 2 weeks.   Diabetes II Samantha Lozano has been given extensive diabetes education by myself today including ideal fasting and post-prandial blood glucose readings, individual ideal HgA1c goals  and hypoglycemia prevention. We discussed the importance of good blood sugar control to decrease the likelihood of diabetic complications such as nephropathy, neuropathy, limb loss, blindness, coronary artery disease, and death. We discussed the importance of intensive lifestyle modification including diet, exercise and weight loss as the first line treatment for diabetes. Samantha Lozano agrees to continue her diabetes medications. Samantha Lozano agrees to follow up with our office in 2 weeks.   Hypertension We discussed sodium restriction, working on healthy weight loss, and a regular exercise program as the means to achieve improved blood  pressure control. Samantha Lozano agreed with this plan and agreed to follow up as directed. We will continue to monitor her blood pressure as well as her progress with the above lifestyle modifications. She will continue her medications as prescribed and will watch for signs of hypotension as she continues her lifestyle modifications. Samantha Lozano agrees to follow up with our office in 2 weeks.   Obesity Samantha Lozano is currently in the action stage of change. As such, her goal is to continue with weight loss efforts She has agreed to follow the Category 1 plan Samantha Lozano has been instructed to work up to a goal of 150 minutes of combined cardio and strengthening exercise per week for weight loss and overall health benefits. We discussed the following Behavioral Modification Stratagies today: increasing lean protein intake, increase H2O intake, no skipping meals, decreasing simple carbohydrates , increasing vegetables and work on meal planning and easy cooking plans  Samantha Lozano has agreed to follow up with our clinic in 2 weeks. She was informed of the importance of frequent follow up visits to maximize her success with intensive lifestyle modifications for her multiple health conditions.   OBESITY BEHAVIORAL INTERVENTION VISIT  Today's visit was # 11   Starting weight: 237 lbs Starting date: 03/10/2018 Today's weight : Weight: 193 lb (87.5 kg)  Today's date: 08/11/2018 Total lbs lost to date: 44 lbs At least 15 minutes were spent on discussing the following behavioral intervention visit.   ASK: We discussed the diagnosis of obesity with Samantha Lozano today and Samantha Lozano agreed to give [name] permission to discuss obesity behavioral modification therapy today.  ASSESS: Samantha Lozano has the diagnosis of obesity and her BMI today is 35.29 Samantha Lozano is in the action stage of change   ADVISE: Ailish was educated on the multiple health risks of obesity as well as the benefit of weight loss to improve her health. She was advised  of the need for long term treatment and the importance of lifestyle modifications to improve her current health and to decrease her risk of future health problems.  AGREE: Multiple dietary modification options and treatment options were discussed and  Samantha Lozano agreed to follow the recommendations documented in the  above note.  ARRANGE: Mileena was educated on the importance of frequent visits to treat obesity as outlined per CMS and USPSTF guidelines and agreed to schedule her next follow up appointment today.  IRemi Deter, CMA, am acting as transcriptionist for Jearld Lesch, MD  I have reviewed the above documentation for accuracy and completeness, and I agree with the above. -Jearld Lesch, DO

## 2018-08-18 ENCOUNTER — Encounter (INDEPENDENT_AMBULATORY_CARE_PROVIDER_SITE_OTHER): Payer: Self-pay | Admitting: Bariatrics

## 2018-08-26 ENCOUNTER — Encounter (INDEPENDENT_AMBULATORY_CARE_PROVIDER_SITE_OTHER): Payer: Self-pay

## 2018-08-26 ENCOUNTER — Ambulatory Visit (INDEPENDENT_AMBULATORY_CARE_PROVIDER_SITE_OTHER): Payer: PPO | Admitting: Bariatrics

## 2018-08-28 ENCOUNTER — Ambulatory Visit (INDEPENDENT_AMBULATORY_CARE_PROVIDER_SITE_OTHER): Payer: PPO | Admitting: Bariatrics

## 2018-08-28 ENCOUNTER — Encounter (INDEPENDENT_AMBULATORY_CARE_PROVIDER_SITE_OTHER): Payer: Self-pay | Admitting: Bariatrics

## 2018-08-28 VITALS — BP 122/65 | HR 62 | Temp 97.8°F | Ht 62.0 in | Wt 189.0 lb

## 2018-08-28 DIAGNOSIS — E559 Vitamin D deficiency, unspecified: Secondary | ICD-10-CM

## 2018-08-28 DIAGNOSIS — Z6834 Body mass index (BMI) 34.0-34.9, adult: Secondary | ICD-10-CM

## 2018-08-28 DIAGNOSIS — E669 Obesity, unspecified: Secondary | ICD-10-CM | POA: Diagnosis not present

## 2018-08-28 DIAGNOSIS — E119 Type 2 diabetes mellitus without complications: Secondary | ICD-10-CM

## 2018-08-28 NOTE — Progress Notes (Signed)
Office: [phone]  /  Fax: [phone]   HPI:   Chief Complaint: OBESITY Samantha Lozano is here to discuss her progress with her obesity treatment plan. She is on the Category 1 plan and is following her eating plan approximately 85 % of the time. She states she is walking 45 minutes 6 times per week. Samantha Lozano is doing well. She has been socializing well. Her hunger has been well controlled. Her weight is 189 lb (85.7 kg) today and has had a weight loss of 4 pounds over a period of 2 weeks since her last visit. She has lost 48 lbs since starting treatment with [name].  Vitamin D deficiency Samantha Lozano has a diagnosis of vitamin D deficiency. She is currently taking high dose prescription vit D and denies nausea, vomiting or muscle weakness.  Diabetes II Samantha Lozano has a diagnosis of diabetes type II. Samantha Lozano states fasting BGs range between 115 and 130 and 2 hour post prandial BGs range between 105 and 145. Samantha Lozano denies any hypoglycemic episodes. Last A1c was at 6.3 on 06/13/18. She has been working on intensive lifestyle modifications including diet, exercise, and weight loss to help control her blood glucose levels.  ASSESSMENT AND PLAN:  Vitamin D deficiency  Type 2 diabetes mellitus without complication, without long-term current use of insulin (HCC)  Class 1 obesity with serious comorbidity and body mass index (BMI) of 34.0 to 34.9 in adult, unspecified obesity type  PLAN:  Vitamin D Deficiency Lilley was informed that low vitamin D levels contributes to fatigue and are associated with obesity, breast, and colon cancer. She agrees to continue to take prescription Vit D @50 ,000 IU every week and will follow up for routine testing of vitamin D, at least 2-3 times per year. She was informed of the risk of over-replacement of vitamin D and agrees to not increase her dose unless she discusses this with [name] first.  Diabetes II Zainab has been given extensive diabetes education by myself today including  ideal fasting and post-prandial blood glucose readings, individual ideal Hgb A1c goals and hypoglycemia prevention. We discussed the importance of good blood sugar control to decrease the likelihood of diabetic complications such as nephropathy, neuropathy, limb loss, blindness, coronary artery disease, and death. We discussed the importance of intensive lifestyle modification including diet, exercise and weight loss as the first line treatment for diabetes. Dorita agrees to continue her diabetes medications and continue checking fasting blood sugar and 2 hour post prandial blood sugar. She will follow up at the agreed upon time.  Obesity Samantha Lozano is currently in the action stage of change. As such, her goal is to continue with weight loss efforts She will continue to follow the Category 1 plan Samantha Lozano has been instructed to work up to a goal of 150 minutes of combined cardio and strengthening exercise per week for weight loss and overall health benefits. We discussed the following Behavioral Modification Strategies today: increase H2O intake, better snacking choices, increasing lean protein intake, decreasing simple carbohydrates, increasing vegetables and work on meal planning and easy cooking plans We discussed fruit options today and "fruit options" handout was provided to patient today.  Samantha Lozano has agreed to follow up with our clinic in 2 weeks. She was informed of the importance of frequent follow up visits to maximize her success with intensive lifestyle modifications for her multiple health conditions.  ALLERGIES: Allergies  Allergen Reactions  . Lisinopril Cough    MEDICATIONS: Current Outpatient Medications on File Prior to Visit  Medication Sig  Dispense Refill  . aspirin EC 81 MG tablet Take 81 mg by mouth daily.    . carvedilol (COREG) 25 MG tablet Take 25 mg by mouth 2 (two) times daily with a meal.    . glucose blood test strip Check BS twice daily 100 each 0  . hydrochlorothiazide  (HYDRODIURIL) 25 MG tablet Take 25 mg by mouth daily.    [name] levothyroxine (SYNTHROID, LEVOTHROID) 125 MCG tablet Take 125 mcg by mouth daily before breakfast.    . lovastatin (MEVACOR) 40 MG tablet Take 40 mg by mouth at bedtime.    . metFORMIN (GLUCOPHAGE-XR) 750 MG 24 hr tablet Take 1 tablet (750 mg total) by mouth daily with breakfast. (Patient taking differently: Take 750 mg by mouth daily with breakfast. Take 1 tablet twice a day.) 30 tablet 0  . Omega-3 Fatty Acids (FISH OIL) 435 MG CAPS Take 1 capsule by mouth daily at 2 PM.    . Vitamin D, Ergocalciferol, (DRISDOL) 1.25 MG (50000 UT) CAPS capsule Take 1 capsule (50,000 Units total) by mouth every 14 (fourteen) days. 2 capsule 0   No current facility-administered medications on file prior to visit.     PAST MEDICAL HISTORY: Past Medical History:  Diagnosis Date  . Diabetes mellitus, type 2 ([place])   . HLD (hyperlipidemia)   . HTN (hypertension)   . Leg edema   . Other specified disorders of thyroid     PAST SURGICAL HISTORY: Past Surgical History:  Procedure Laterality Date  . ABDOMINAL HYSTERECTOMY    . THYROID SURGERY      SOCIAL HISTORY: Social History   Tobacco Use  . Smoking status: Never Smoker  . Smokeless tobacco: Never Used  Substance Use Topics  . Alcohol use: Not Currently  . Drug use: Not Currently    FAMILY HISTORY: Family History  Problem Relation Age of Onset  . Diabetes Mother   . Hypertension Mother   . Cancer Mother   . Obesity Mother   . Hypertension Father   . Cancer Father   . Obesity Father     ROS: Review of Systems  Constitutional: Positive for weight loss.  Gastrointestinal: Negative for nausea and vomiting.  Musculoskeletal:       Negative for muscle weakness  Endo/Heme/Allergies:       Negative for hypoglycemia    PHYSICAL EXAM: Blood pressure 122/65, pulse 62, temperature 97.8 F (36.6 C), temperature source Oral, height 5\' 2"  (1.575 m), weight 189 lb (85.7 kg), SpO2 100  %. Body mass index is 34.57 kg/m. Physical Exam Vitals signs reviewed.  Constitutional:      Appearance: Normal appearance. She is well-developed. She is obese.  Cardiovascular:     Rate and Rhythm: Normal rate.  Pulmonary:     Effort: Pulmonary effort is normal.  Musculoskeletal: Normal range of motion.  Skin:    General: Skin is warm and dry.  Neurological:     Mental Status: She is alert and oriented to person, place, and time.  Psychiatric:        Mood and Affect: Mood normal.        Behavior: Behavior normal.     RECENT LABS AND TESTS: BMET    Component Value Date/Time   NA 140 07/24/2018 1003   K 4.4 07/24/2018 1003   CL 100 07/24/2018 1003   CO2 25 07/24/2018 1003   GLUCOSE 117 (H) 07/24/2018 1003   GLUCOSE 126 (H) 06/13/2018 0902   BUN 20 07/24/2018 1003  CREATININE 0.74 07/24/2018 1003   CALCIUM 9.8 07/24/2018 1003   GFRNONAA 82 07/24/2018 1003   GFRAA 95 07/24/2018 1003   Lab Results  Component Value Date   HGBA1C 6.3 06/13/2018   HGBA1C 7.4 (H) 03/10/2018   Lab Results  Component Value Date   INSULIN 15.0 03/10/2018   CBC    Component Value Date/Time   WBC 6.5 03/10/2018 0839   WBC 10.3 09/24/2007 0515   RBC 4.47 03/10/2018 0839   RBC 3.89 09/24/2007 0515   HGB 12.1 03/10/2018 0839   HCT 37.6 03/10/2018 0839   PLT 208 09/24/2007 0515   MCV 84 03/10/2018 0839   MCH 27.1 03/10/2018 0839   MCHC 32.2 03/10/2018 0839   MCHC 34.6 09/24/2007 0515   RDW 14.7 03/10/2018 0839   LYMPHSABS 2.0 03/10/2018 0839   EOSABS 0.3 03/10/2018 0839   BASOSABS 0.0 03/10/2018 0839   Iron/TIBC/Ferritin/ %Sat No results found for: IRON, TIBC, FERRITIN, IRONPCTSAT Lipid Panel     Component Value Date/Time   CHOL 149 03/10/2018 0839   TRIG 125 03/10/2018 0839   HDL 42 03/10/2018 0839   LDLCALC 82 03/10/2018 0839   Hepatic Function Panel     Component Value Date/Time   PROT 7.4 07/24/2018 1003   ALBUMIN 4.7 07/24/2018 1003   AST 13 07/24/2018 1003   ALT  11 07/24/2018 1003   ALKPHOS 86 07/24/2018 1003   BILITOT 0.6 07/24/2018 1003      Component Value Date/Time   TSH 1.300 03/10/2018 0839    Ref. Range 07/24/2018 10:03  Vitamin D, 25-Hydroxy Latest Ref Range: 30.0 - 100.0 ng/mL 56.8     OBESITY BEHAVIORAL INTERVENTION VISIT  Today's visit was # 12   Starting weight: 237 lbs Starting date: 03/10/2018 Today's weight : 189 lbs Today's date: 08/28/2018 Total lbs lost to date: 22 At least 15 minutes were spent on discussing the following behavioral intervention visit.   ASK: We discussed the diagnosis of obesity with [name] today and Timothy agreed to give [name] permission to discuss obesity behavioral modification therapy today.  ASSESS: Jisselle has the diagnosis of obesity and her BMI today is 34.56 Tedra is in the action stage of change   ADVISE: Yalitza was educated on the multiple health risks of obesity as well as the benefit of weight loss to improve her health. She was advised of the need for long term treatment and the importance of lifestyle modifications to improve her current health and to decrease her risk of future health problems.  AGREE: Multiple dietary modification options and treatment options were discussed and  Elaiza agreed to follow the recommendations documented in the above note.  ARRANGE: Krishana was educated on the importance of frequent visits to treat obesity as outlined per CMS and USPSTF guidelines and agreed to schedule her next follow up appointment today.  Corey Skains, am acting as Location manager for [employer]. Owens Shark, DO  I have reviewed the above documentation for accuracy and completeness, and I agree with the above. -Jearld Lesch, DO

## 2018-09-17 ENCOUNTER — Telehealth (INDEPENDENT_AMBULATORY_CARE_PROVIDER_SITE_OTHER): Payer: Self-pay | Admitting: Bariatrics

## 2018-09-17 ENCOUNTER — Other Ambulatory Visit (INDEPENDENT_AMBULATORY_CARE_PROVIDER_SITE_OTHER): Payer: Self-pay

## 2018-09-17 DIAGNOSIS — E119 Type 2 diabetes mellitus without complications: Secondary | ICD-10-CM

## 2018-09-17 MED ORDER — GLUCOSE BLOOD VI STRP: ORAL_STRIP | 0 refills | Status: DC

## 2018-09-17 NOTE — Telephone Encounter (Signed)
Rx sent to pharmacy. [facility], [place]

## 2018-09-17 NOTE — Telephone Encounter (Signed)
Pharmacy ([facility]) is requesting refill on one touch ultra blue test strips. drh

## 2018-09-23 ENCOUNTER — Encounter (INDEPENDENT_AMBULATORY_CARE_PROVIDER_SITE_OTHER): Payer: Self-pay | Admitting: Bariatrics

## 2018-09-23 ENCOUNTER — Ambulatory Visit (INDEPENDENT_AMBULATORY_CARE_PROVIDER_SITE_OTHER): Payer: PPO | Admitting: Bariatrics

## 2018-09-23 VITALS — BP 126/79 | HR 59 | Temp 97.8°F | Ht 62.0 in | Wt 193.0 lb

## 2018-09-23 DIAGNOSIS — Z6835 Body mass index (BMI) 35.0-35.9, adult: Secondary | ICD-10-CM | POA: Diagnosis not present

## 2018-09-23 DIAGNOSIS — E559 Vitamin D deficiency, unspecified: Secondary | ICD-10-CM

## 2018-09-23 DIAGNOSIS — E119 Type 2 diabetes mellitus without complications: Secondary | ICD-10-CM | POA: Diagnosis not present

## 2018-09-24 NOTE — Progress Notes (Signed)
Office: [phone]  /  Fax: [phone]   HPI:   Chief Complaint: OBESITY Samantha Lozano is here to discuss her progress with her obesity treatment plan. She is on the Category 1 plan and is following her eating plan approximately 45 % of the time. She states she is walking 45 minutes 6 times per week. Samantha Lozano has struggled since her last visit. She has gotten out of her routine. Samantha Lozano has an increase of 1 1/2 pounds in water weight. Her weight is 193 lb (87.5 kg) today and has had a weight gain of 4 pounds over a period of 3 to 4 weeks since her last visit. She has lost 44 lbs since starting treatment with Samantha Lozano.  Diabetes II Shearon has a diagnosis of diabetes type II. Derya states fasting BGs range between 120 and 130 and she denies any hypoglycemic episodes. Cyndee is not checking her 2 hour post prandial BGs. Last A1c was at 6.3 She has been working on intensive lifestyle modifications including diet, exercise, and weight loss to help control her blood glucose levels.  Vitamin D deficiency Samantha Lozano has a diagnosis of vitamin D deficiency. She is currently taking high dose vit D and denies nausea, vomiting or muscle weakness.  ASSESSMENT AND PLAN:  Type 2 diabetes mellitus without complication, without long-term current use of insulin (HCC)  Vitamin D deficiency  Class 2 severe obesity with serious comorbidity and body mass index (BMI) of 35.0 to 35.9 in adult, unspecified obesity type ([place])  PLAN:  Diabetes II Miguel has been given extensive diabetes education by myself today including ideal fasting and post-prandial blood glucose readings, individual ideal Hgb A1c goals and hypoglycemia prevention. We discussed the importance of good blood sugar control to decrease the likelihood of diabetic complications such as nephropathy, neuropathy, limb loss, blindness, coronary artery disease, and death. We discussed the importance of intensive lifestyle modification including diet, exercise and  weight loss as the first line treatment for diabetes. Temprance agrees to continue her diabetes medications and will follow up at the agreed upon time.  Vitamin D Deficiency Samantha Lozano was informed that low vitamin D levels contributes to fatigue and are associated with obesity, breast, and colon cancer. She agrees to continue to take prescription Vit D @50 ,000 IU every week and will follow up for routine testing of vitamin D, at least 2-3 times per year. She was informed of the risk of over-replacement of vitamin D and agrees to not increase her dose unless she discusses this with Samantha Lozano first.  Obesity Samantha Lozano is currently in the action stage of change. As such, her goal is to continue with weight loss efforts She has agreed to follow the Category 1 plan with additional Category 1 and Category 2 breakfast options Samantha Lozano has been instructed to work up to a goal of 150 minutes of combined cardio and strengthening exercise per week for weight loss and overall health benefits. We discussed the following Behavioral Modification Strategies today: increase H2O intake,keeping healthy foods in the home, increasing lean protein intake, decreasing simple carbohydrates, increasing vegetables and work on meal planning and easy cooking plans  Samantha Lozano has agreed to follow up with our clinic in 2 weeks. She was informed of the importance of frequent follow up visits to maximize her success with intensive lifestyle modifications for her multiple health conditions.  ALLERGIES: Allergies  Allergen Reactions  . Lisinopril Cough    MEDICATIONS: Current Outpatient Medications on File Prior to Visit  Medication Sig Dispense Refill  . aspirin  EC 81 MG tablet Take 81 mg by mouth daily.    . carvedilol (COREG) 25 MG tablet Take 25 mg by mouth 2 (two) times daily with a meal.    . glucose blood test strip Check BS twice daily 100 each 0  . hydrochlorothiazide (HYDRODIURIL) 25 MG tablet Take 25 mg by mouth daily.    Samantha Lozano  levothyroxine (SYNTHROID, LEVOTHROID) 125 MCG tablet Take 125 mcg by mouth daily before breakfast.    . lovastatin (MEVACOR) 40 MG tablet Take 40 mg by mouth at bedtime.    . metFORMIN (GLUCOPHAGE-XR) 750 MG 24 hr tablet Take 1 tablet (750 mg total) by mouth daily with breakfast. (Patient taking differently: Take 750 mg by mouth daily with breakfast. Take 1 tablet twice a day.) 30 tablet 0  . Omega-3 Fatty Acids (FISH OIL) 435 MG CAPS Take 1 capsule by mouth daily at 2 PM.    . Vitamin D, Ergocalciferol, (DRISDOL) 1.25 MG (50000 UT) CAPS capsule Take 1 capsule (50,000 Units total) by mouth every 14 (fourteen) days. 2 capsule 0   No current facility-administered medications on file prior to visit.     PAST MEDICAL HISTORY: Past Medical History:  Diagnosis Date  . Diabetes mellitus, type 2 ([name])   . HLD (hyperlipidemia)   . HTN (hypertension)   . Leg edema   . Other specified disorders of thyroid     PAST SURGICAL HISTORY: Past Surgical History:  Procedure Laterality Date  . ABDOMINAL HYSTERECTOMY    . THYROID SURGERY      SOCIAL HISTORY: Social History   Tobacco Use  . Smoking status: Never Smoker  . Smokeless tobacco: Never Used  Substance Use Topics  . Alcohol use: Not Currently  . Drug use: Not Currently    FAMILY HISTORY: Family History  Problem Relation Age of Onset  . Diabetes Mother   . Hypertension Mother   . Cancer Mother   . Obesity Mother   . Hypertension Father   . Cancer Father   . Obesity Father     ROS: Review of Systems  Constitutional: Negative for weight loss.  Gastrointestinal: Negative for nausea and vomiting.  Musculoskeletal:       Negative for muscle weakness  Endo/Heme/Allergies:       Negative for hypoglycemia    PHYSICAL EXAM: Blood pressure 126/79, pulse (!) 59, temperature 97.8 F (36.6 C), temperature source Oral, height 5\' 2"  (1.575 m), weight 193 lb (87.5 kg), SpO2 98 %. Body mass index is 35.3 kg/m. Physical Exam Vitals  signs reviewed.  Constitutional:      Appearance: Normal appearance. She is well-developed. She is obese.  Cardiovascular:     Rate and Rhythm: Normal rate.  Pulmonary:     Effort: Pulmonary effort is normal.  Musculoskeletal: Normal range of motion.  Skin:    General: Skin is warm and dry.  Neurological:     Mental Status: She is alert and oriented to person, place, and time.  Psychiatric:        Mood and Affect: Mood normal.        Behavior: Behavior normal.     RECENT LABS AND TESTS: BMET    Component Value Date/Time   NA 140 07/24/2018 1003   K 4.4 07/24/2018 1003   CL 100 07/24/2018 1003   CO2 25 07/24/2018 1003   GLUCOSE 117 (H) 07/24/2018 1003   GLUCOSE 126 (H) 06/13/2018 0902   BUN 20 07/24/2018 1003   CREATININE 0.74 07/24/2018 1003  CALCIUM 9.8 07/24/2018 1003   GFRNONAA 82 07/24/2018 1003   GFRAA 95 07/24/2018 1003   Lab Results  Component Value Date   HGBA1C 6.3 06/13/2018   HGBA1C 7.4 (H) 03/10/2018   Lab Results  Component Value Date   INSULIN 15.0 03/10/2018   CBC    Component Value Date/Time   WBC 6.5 03/10/2018 0839   WBC 10.3 09/24/2007 0515   RBC 4.47 03/10/2018 0839   RBC 3.89 09/24/2007 0515   HGB 12.1 03/10/2018 0839   HCT 37.6 03/10/2018 0839   PLT 208 09/24/2007 0515   MCV 84 03/10/2018 0839   MCH 27.1 03/10/2018 0839   MCHC 32.2 03/10/2018 0839   MCHC 34.6 09/24/2007 0515   RDW 14.7 03/10/2018 0839   LYMPHSABS 2.0 03/10/2018 0839   EOSABS 0.3 03/10/2018 0839   BASOSABS 0.0 03/10/2018 0839   Iron/TIBC/Ferritin/ %Sat No results found for: IRON, TIBC, FERRITIN, IRONPCTSAT Lipid Panel     Component Value Date/Time   CHOL 149 03/10/2018 0839   TRIG 125 03/10/2018 0839   HDL 42 03/10/2018 0839   LDLCALC 82 03/10/2018 0839   Hepatic Function Panel     Component Value Date/Time   PROT 7.4 07/24/2018 1003   ALBUMIN 4.7 07/24/2018 1003   AST 13 07/24/2018 1003   ALT 11 07/24/2018 1003   ALKPHOS 86 07/24/2018 1003    BILITOT 0.6 07/24/2018 1003      Component Value Date/Time   TSH 1.300 03/10/2018 0839     Ref. Range 07/24/2018 10:03  Vitamin D, 25-Hydroxy Latest Ref Range: 30.0 - 100.0 ng/mL 56.8     OBESITY BEHAVIORAL INTERVENTION VISIT  Today's visit was # 13   Starting weight: 237 lbs Starting date: 03/10/2018 Today's weight : 193 lbs  Today's date: 09/23/2018 Total lbs lost to date: 82 At least 15 minutes were spent on discussing the following behavioral intervention visit.   ASK: We discussed the diagnosis of obesity with [name] today and Mariena agreed to give Samantha Lozano permission to discuss obesity behavioral modification therapy today.  ASSESS: Lilleigh has the diagnosis of obesity and her BMI today is 35.29 Leonor is in the action stage of change   ADVISE: Carnesha was educated on the multiple health risks of obesity as well as the benefit of weight loss to improve her health. She was advised of the need for long term treatment and the importance of lifestyle modifications to improve her current health and to decrease her risk of future health problems.  AGREE: Multiple dietary modification options and treatment options were discussed and  Regnia agreed to follow the recommendations documented in the above note.  ARRANGE: Maudean was educated on the importance of frequent visits to treat obesity as outlined per CMS and USPSTF guidelines and agreed to schedule her next follow up appointment today.  Corey Skains, am acting as Location manager for [employer]. Owens Shark, DO  I have reviewed the above documentation for accuracy and completeness, and I agree with the above. -Jearld Lesch, DO

## 2018-09-25 ENCOUNTER — Encounter (INDEPENDENT_AMBULATORY_CARE_PROVIDER_SITE_OTHER): Payer: Self-pay | Admitting: Bariatrics

## 2018-09-25 DIAGNOSIS — Z6835 Body mass index (BMI) 35.0-35.9, adult: Secondary | ICD-10-CM

## 2018-09-25 DIAGNOSIS — Z6839 Body mass index (BMI) 39.0-39.9, adult: Secondary | ICD-10-CM | POA: Insufficient documentation

## 2018-10-02 DIAGNOSIS — L720 Epidermal cyst: Secondary | ICD-10-CM | POA: Diagnosis not present

## 2018-10-02 DIAGNOSIS — E119 Type 2 diabetes mellitus without complications: Secondary | ICD-10-CM | POA: Diagnosis not present

## 2018-10-07 ENCOUNTER — Encounter (INDEPENDENT_AMBULATORY_CARE_PROVIDER_SITE_OTHER): Payer: Self-pay | Admitting: Bariatrics

## 2018-10-07 ENCOUNTER — Ambulatory Visit (INDEPENDENT_AMBULATORY_CARE_PROVIDER_SITE_OTHER): Payer: PPO | Admitting: Bariatrics

## 2018-10-07 VITALS — BP 147/87 | HR 64 | Temp 97.7°F | Ht 62.0 in | Wt 196.0 lb

## 2018-10-07 DIAGNOSIS — E119 Type 2 diabetes mellitus without complications: Secondary | ICD-10-CM | POA: Diagnosis not present

## 2018-10-07 DIAGNOSIS — Z6836 Body mass index (BMI) 36.0-36.9, adult: Secondary | ICD-10-CM | POA: Diagnosis not present

## 2018-10-07 DIAGNOSIS — E559 Vitamin D deficiency, unspecified: Secondary | ICD-10-CM | POA: Diagnosis not present

## 2018-10-07 MED ORDER — VITAMIN D (ERGOCALCIFEROL) 1.25 MG (50000 UNIT) PO CAPS: 50000.0000 [IU] | ORAL_CAPSULE | ORAL | 0 refills | Status: DC

## 2018-10-08 NOTE — Progress Notes (Signed)
Office: [phone]  /  Fax: [phone]   HPI:   Chief Complaint: OBESITY Samantha Lozano is here to discuss her progress with her obesity treatment plan. She is on the Category 1 plan with Category 1 and Category 2 breakfast options plan and is following her eating plan approximately 30 to 40 % of the time. She states she is walking 45 minutes 6 times per week. Samantha Lozano has been doing well overall. She has gained weight since her last visit. She has been down in the "dumps". Her weight is 196 lb (88.9 kg) today and has had a weight gain of 3 pounds over a period of 2 weeks since her last visit. She has lost 41 lbs since starting treatment with [name].  Vitamin D deficiency Samantha Lozano has a diagnosis of vitamin D deficiency. She is currently taking vit D and denies nausea, vomiting or muscle weakness.  Diabetes II Samantha Lozano has a diagnosis of diabetes type II. Samantha Lozano is taking Metformin and she denies any hypoglycemic episodes. Last A1c was at 6.3 She has been working on intensive lifestyle modifications including diet, exercise, and weight loss to help control her blood glucose levels.  ASSESSMENT AND PLAN:  Vitamin D deficiency - Plan: Vitamin D, Ergocalciferol, (DRISDOL) 1.25 MG (50000 UT) CAPS capsule  Type 2 diabetes mellitus without complication, without long-term current use of insulin (HCC)  Class 2 severe obesity with serious comorbidity and body mass index (BMI) of 36.0 to 36.9 in adult, unspecified obesity type ([place])  PLAN:  Vitamin D Deficiency Samantha Lozano was informed that low vitamin D levels contributes to fatigue and are associated with obesity, breast, and colon cancer. She agrees to continue to take prescription Vit D @50 ,000 IU every week and will follow up for routine testing of vitamin D, at least 2-3 times per year. She was informed of the risk of over-replacement of vitamin D and agrees to not increase her dose unless she discusses this with [name] first.  Diabetes II Samantha Lozano has been  given extensive diabetes education by myself today including ideal fasting and post-prandial blood glucose readings, individual ideal Hgb A1c goals and hypoglycemia prevention. We discussed the importance of good blood sugar control to decrease the likelihood of diabetic complications such as nephropathy, neuropathy, limb loss, blindness, coronary artery disease, and death. We discussed the importance of intensive lifestyle modification including diet, exercise and weight loss as the first line treatment for diabetes. [place] will continue Metformin and will follow up at the agreed upon time.  Obesity Samantha Lozano is currently in the action stage of change. As such, her goal is to continue with weight loss efforts She has agreed to follow the Category 1 plan with additional Category 1 and Category 2 breakfast options Samantha Lozano will continue walking for 45 minutes 6 times per week for weight loss and overall health benefits. We discussed the following Behavioral Modification Strategies today: increase H2O intake, keeping healthy foods in the home, increasing lean protein intake, decreasing simple carbohydrates, increasing vegetables and work on meal planning and easy cooking plans  Samantha Lozano has agreed to follow up with our clinic in 2 weeks. She was informed of the importance of frequent follow up visits to maximize her success with intensive lifestyle modifications for her multiple health conditions.  ALLERGIES: Allergies  Allergen Reactions  . Lisinopril Cough    MEDICATIONS: Current Outpatient Medications on File Prior to Visit  Medication Sig Dispense Refill  . aspirin EC 81 MG tablet Take 81 mg by mouth daily.    [name]  carvedilol (COREG) 25 MG tablet Take 25 mg by mouth 2 (two) times daily with a meal.    . glucose blood test strip Check BS twice daily 100 each 0  . hydrochlorothiazide (HYDRODIURIL) 25 MG tablet Take 25 mg by mouth daily.    [name] levothyroxine (SYNTHROID, LEVOTHROID) 125 MCG tablet Take 125  mcg by mouth daily before breakfast.    . lovastatin (MEVACOR) 40 MG tablet Take 40 mg by mouth at bedtime.    . metFORMIN (GLUCOPHAGE-XR) 750 MG 24 hr tablet Take 1 tablet (750 mg total) by mouth daily with breakfast. (Patient taking differently: Take 750 mg by mouth daily with breakfast. Take 1 tablet twice a day.) 30 tablet 0  . Omega-3 Fatty Acids (FISH OIL) 435 MG CAPS Take 1 capsule by mouth daily at 2 PM.     No current facility-administered medications on file prior to visit.     PAST MEDICAL HISTORY: Past Medical History:  Diagnosis Date  . Diabetes mellitus, type 2 ([place])   . HLD (hyperlipidemia)   . HTN (hypertension)   . Leg edema   . Other specified disorders of thyroid     PAST SURGICAL HISTORY: Past Surgical History:  Procedure Laterality Date  . ABDOMINAL HYSTERECTOMY    . THYROID SURGERY      SOCIAL HISTORY: Social History   Tobacco Use  . Smoking status: Never Smoker  . Smokeless tobacco: Never Used  Substance Use Topics  . Alcohol use: Not Currently  . Drug use: Not Currently    FAMILY HISTORY: Family History  Problem Relation Age of Onset  . Diabetes Mother   . Hypertension Mother   . Cancer Mother   . Obesity Mother   . Hypertension Father   . Cancer Father   . Obesity Father     ROS: Review of Systems  Constitutional: Negative for weight loss.  Gastrointestinal: Negative for nausea and vomiting.  Musculoskeletal:       Negative for muscle weakness  Endo/Heme/Allergies:       Negative for hypoglycemia    PHYSICAL EXAM: Blood pressure (!) 147/87, pulse 64, temperature 97.7 F (36.5 C), temperature source Oral, height 5\' 2"  (1.575 m), weight 196 lb (88.9 kg), SpO2 99 %. Body mass index is 35.85 kg/m. Physical Exam Vitals signs reviewed.  Constitutional:      Appearance: Normal appearance. She is well-developed. She is obese.  Cardiovascular:     Rate and Rhythm: Normal rate.  Pulmonary:     Effort: Pulmonary effort is normal.    Musculoskeletal: Normal range of motion.  Skin:    General: Skin is warm and dry.  Neurological:     Mental Status: She is alert and oriented to person, place, and time.  Psychiatric:        Mood and Affect: Mood normal.        Behavior: Behavior normal.     RECENT LABS AND TESTS: BMET    Component Value Date/Time   NA 140 07/24/2018 1003   K 4.4 07/24/2018 1003   CL 100 07/24/2018 1003   CO2 25 07/24/2018 1003   GLUCOSE 117 (H) 07/24/2018 1003   GLUCOSE 126 (H) 06/13/2018 0902   BUN 20 07/24/2018 1003   CREATININE 0.74 07/24/2018 1003   CALCIUM 9.8 07/24/2018 1003   GFRNONAA 82 07/24/2018 1003   GFRAA 95 07/24/2018 1003   Lab Results  Component Value Date   HGBA1C 6.3 06/13/2018   HGBA1C 7.4 (H) 03/10/2018   Lab  Results  Component Value Date   INSULIN 15.0 03/10/2018   CBC    Component Value Date/Time   WBC 6.5 03/10/2018 0839   WBC 10.3 09/24/2007 0515   RBC 4.47 03/10/2018 0839   RBC 3.89 09/24/2007 0515   HGB 12.1 03/10/2018 0839   HCT 37.6 03/10/2018 0839   PLT 208 09/24/2007 0515   MCV 84 03/10/2018 0839   MCH 27.1 03/10/2018 0839   MCHC 32.2 03/10/2018 0839   MCHC 34.6 09/24/2007 0515   RDW 14.7 03/10/2018 0839   LYMPHSABS 2.0 03/10/2018 0839   EOSABS 0.3 03/10/2018 0839   BASOSABS 0.0 03/10/2018 0839   Iron/TIBC/Ferritin/ %Sat No results found for: IRON, TIBC, FERRITIN, IRONPCTSAT Lipid Panel     Component Value Date/Time   CHOL 149 03/10/2018 0839   TRIG 125 03/10/2018 0839   HDL 42 03/10/2018 0839   LDLCALC 82 03/10/2018 0839   Hepatic Function Panel     Component Value Date/Time   PROT 7.4 07/24/2018 1003   ALBUMIN 4.7 07/24/2018 1003   AST 13 07/24/2018 1003   ALT 11 07/24/2018 1003   ALKPHOS 86 07/24/2018 1003   BILITOT 0.6 07/24/2018 1003      Component Value Date/Time   TSH 1.300 03/10/2018 0839     Ref. Range 07/24/2018 10:03  Vitamin D, 25-Hydroxy Latest Ref Range: 30.0 - 100.0 ng/mL 56.8     OBESITY BEHAVIORAL  INTERVENTION VISIT  Today's visit was # 14   Starting weight: 237 lbs Starting date: 03/10/2018 Today's weight : 196 lbs  Today's date: 10/07/2018 Total lbs lost to date: 41 At least 15 minutes were spent on discussing the following behavioral intervention visit.   ASK: We discussed the diagnosis of obesity with Samantha Lozano today and Samantha Lozano agreed to give [name] permission to discuss obesity behavioral modification therapy today.  ASSESS: Caidyn has the diagnosis of obesity and her BMI today is 35.84 Tyera is in the action stage of change   ADVISE: Samantha Lozano was educated on the multiple health risks of obesity as well as the benefit of weight loss to improve her health. She was advised of the need for long term treatment and the importance of lifestyle modifications to improve her current health and to decrease her risk of future health problems.  AGREE: Multiple dietary modification options and treatment options were discussed and  Samantha Lozano agreed to follow the recommendations documented in the above note.  ARRANGE: Samantha Lozano was educated on the importance of frequent visits to treat obesity as outlined per CMS and USPSTF guidelines and agreed to schedule her next follow up appointment today.  Corey Skains, am acting as Location manager for [employer]. Owens Shark, DO  I have reviewed the above documentation for accuracy and completeness, and I agree with the above. -Jearld Lesch, DO

## 2018-10-22 ENCOUNTER — Encounter (INDEPENDENT_AMBULATORY_CARE_PROVIDER_SITE_OTHER): Payer: Self-pay | Admitting: Family Medicine

## 2018-10-22 ENCOUNTER — Ambulatory Visit (INDEPENDENT_AMBULATORY_CARE_PROVIDER_SITE_OTHER): Payer: PPO | Admitting: Family Medicine

## 2018-10-22 VITALS — BP 137/83 | HR 65 | Temp 97.4°F | Ht 62.0 in | Wt 198.0 lb

## 2018-10-22 DIAGNOSIS — E119 Type 2 diabetes mellitus without complications: Secondary | ICD-10-CM

## 2018-10-22 DIAGNOSIS — Z6836 Body mass index (BMI) 36.0-36.9, adult: Secondary | ICD-10-CM | POA: Diagnosis not present

## 2018-10-22 NOTE — Progress Notes (Signed)
Office: [phone]  /  Fax: [phone]   HPI:   Chief Complaint: OBESITY Samantha Lozano is here to discuss her progress with her obesity treatment plan. She is the Category 1 plan with additional Category 1 and Category 2 breakfast options and is following her eating plan approximately 85% of the time. She states she is walking 45 minutes 6 times per week. Samantha Lozano has been gaining weight since Christmas. She has not been walking as much. She has been eating sweets and keeping cookies at home.  Her weight is 198 lb (89.8 kg) today and has had a weight gain of 2 pounds since her last visit. She has lost 39 lbs since starting treatment with [name].  Diabetes II Samantha Lozano has a diagnosis of diabetes type II which is well controlled on metformin. Samantha Lozano states fasting blood sugars range between 105 and 140 with postprandial sugars ranging between 105 and 150. She denies any hypoglycemic episodes. Last A1c was 6.3 on 06/13/2018. She has been working on intensive lifestyle modifications including diet, exercise, and weight loss to help control her blood glucose levels.  ASSESSMENT AND PLAN:  Type 2 diabetes mellitus without complication, without long-term current use of insulin (HCC)  Class 2 severe obesity with serious comorbidity and body mass index (BMI) of 36.0 to 36.9 in adult, unspecified obesity type ([place])  PLAN:  Diabetes II Samantha Lozano has been given extensive diabetes education by myself today including ideal fasting and post-prandial blood glucose readings, individual ideal HgA1c goals  and hypoglycemia prevention. We discussed the importance of good blood sugar control to decrease the likelihood of diabetic complications such as nephropathy, neuropathy, limb loss, blindness, coronary artery disease, and death. We discussed the importance of intensive lifestyle modification including diet, exercise and weight loss as the first line treatment for diabetes. Henya agrees to continue metformin and agrees  to follow-up with our clinic in 2 weeks.   Obesity Samantha Lozano is currently in the action stage of change. As such, her goal is to continue with weight loss efforts. She has agreed to follow the Category 1 plan. Samantha Lozano will continue current exercise regimen for weight loss and overall health benefits. We discussed the following Behavioral Modification Strategies today: increasing lean protein intake, decreasing simple carbohydrates, keeping healthy foods in the home, and planning for success. Samantha Lozano was advised to not buy cookies at the grocery store.   Samantha Lozano has agreed to follow up with our clinic in 2 weeks. She was informed of the importance of frequent follow-up visits to maximize her success with intensive lifestyle modifications for her multiple health conditions.  ALLERGIES: Allergies  Allergen Reactions  . Lisinopril Cough    MEDICATIONS: Current Outpatient Medications on File Prior to Visit  Medication Sig Dispense Refill  . aspirin EC 81 MG tablet Take 81 mg by mouth daily.    . carvedilol (COREG) 25 MG tablet Take 25 mg by mouth 2 (two) times daily with a meal.    . glucose blood test strip Check BS twice daily 100 each 0  . hydrochlorothiazide (HYDRODIURIL) 25 MG tablet Take 25 mg by mouth daily.    [name] levothyroxine (SYNTHROID, LEVOTHROID) 125 MCG tablet Take 125 mcg by mouth daily before breakfast.    . lovastatin (MEVACOR) 40 MG tablet Take 40 mg by mouth at bedtime.    . metFORMIN (GLUCOPHAGE-XR) 750 MG 24 hr tablet Take 1 tablet (750 mg total) by mouth daily with breakfast. (Patient taking differently: Take 750 mg by mouth daily with breakfast. Take  1 tablet twice a day.) 30 tablet 0  . Omega-3 Fatty Acids (FISH OIL) 435 MG CAPS Take 1 capsule by mouth daily at 2 PM.    . Vitamin D, Ergocalciferol, (DRISDOL) 1.25 MG (50000 UT) CAPS capsule Take 1 capsule (50,000 Units total) by mouth every 14 (fourteen) days. 2 capsule 0   No current facility-administered medications on file  prior to visit.     PAST MEDICAL HISTORY: Past Medical History:  Diagnosis Date  . Diabetes mellitus, type 2 ([place])   . HLD (hyperlipidemia)   . HTN (hypertension)   . Leg edema   . Other specified disorders of thyroid     PAST SURGICAL HISTORY: Past Surgical History:  Procedure Laterality Date  . ABDOMINAL HYSTERECTOMY    . THYROID SURGERY      SOCIAL HISTORY: Social History   Tobacco Use  . Smoking status: Never Smoker  . Smokeless tobacco: Never Used  Substance Use Topics  . Alcohol use: Not Currently  . Drug use: Not Currently    FAMILY HISTORY: Family History  Problem Relation Age of Onset  . Diabetes Mother   . Hypertension Mother   . Cancer Mother   . Obesity Mother   . Hypertension Father   . Cancer Father   . Obesity Father     ROS: Review of Systems  Constitutional: Negative for weight loss.  Endo/Heme/Allergies:       Negative for hypoglycemia.   PHYSICAL EXAM: Blood pressure 137/83, pulse 65, temperature (!) 97.4 F (36.3 C), temperature source Oral, height 5\' 2"  (1.575 m), weight 198 lb (89.8 kg), SpO2 99 %. Body mass index is 36.21 kg/m. Physical Exam Vitals signs reviewed.  Constitutional:      Appearance: Normal appearance. She is obese.  Cardiovascular:     Rate and Rhythm: Normal rate.     Pulses: Normal pulses.  Pulmonary:     Effort: Pulmonary effort is normal.     Breath sounds: Normal breath sounds.  Musculoskeletal: Normal range of motion.  Skin:    General: Skin is warm and dry.  Neurological:     Mental Status: She is alert and oriented to person, place, and time.  Psychiatric:        Behavior: Behavior normal.   RECENT LABS AND TESTS: BMET    Component Value Date/Time   NA 140 07/24/2018 1003   K 4.4 07/24/2018 1003   CL 100 07/24/2018 1003   CO2 25 07/24/2018 1003   GLUCOSE 117 (H) 07/24/2018 1003   GLUCOSE 126 (H) 06/13/2018 0902   BUN 20 07/24/2018 1003   CREATININE 0.74 07/24/2018 1003   CALCIUM 9.8  07/24/2018 1003   GFRNONAA 82 07/24/2018 1003   GFRAA 95 07/24/2018 1003   Lab Results  Component Value Date   HGBA1C 6.3 06/13/2018   HGBA1C 7.4 (H) 03/10/2018   Lab Results  Component Value Date   INSULIN 15.0 03/10/2018   CBC    Component Value Date/Time   WBC 6.5 03/10/2018 0839   WBC 10.3 09/24/2007 0515   RBC 4.47 03/10/2018 0839   RBC 3.89 09/24/2007 0515   HGB 12.1 03/10/2018 0839   HCT 37.6 03/10/2018 0839   PLT 208 09/24/2007 0515   MCV 84 03/10/2018 0839   MCH 27.1 03/10/2018 0839   MCHC 32.2 03/10/2018 0839   MCHC 34.6 09/24/2007 0515   RDW 14.7 03/10/2018 0839   LYMPHSABS 2.0 03/10/2018 0839   EOSABS 0.3 03/10/2018 0839   BASOSABS 0.0 03/10/2018  0839   Iron/TIBC/Ferritin/ %Sat No results found for: IRON, TIBC, FERRITIN, IRONPCTSAT Lipid Panel     Component Value Date/Time   CHOL 149 03/10/2018 0839   TRIG 125 03/10/2018 0839   HDL 42 03/10/2018 0839   LDLCALC 82 03/10/2018 0839   Hepatic Function Panel     Component Value Date/Time   PROT 7.4 07/24/2018 1003   ALBUMIN 4.7 07/24/2018 1003   AST 13 07/24/2018 1003   ALT 11 07/24/2018 1003   ALKPHOS 86 07/24/2018 1003   BILITOT 0.6 07/24/2018 1003      Component Value Date/Time   TSH 1.300 03/10/2018 0839   Results for Samantha Lozano, Samantha Lozano (MRN [id number]) as of 10/22/2018 11:41  Ref. Range 07/24/2018 10:03  Vitamin D, 25-Hydroxy Latest Ref Range: 30.0 - 100.0 ng/mL 56.8   OBESITY BEHAVIORAL INTERVENTION VISIT  Today's visit was #15  Starting weight: 237 lbs Starting date: 03/10/2018 Today's weight: 198 lbs Today's date: 10/22/2018 Total lbs lost to date: 39 At least 15 minutes were spent on discussing the following behavioral intervention visit.  ASK: We discussed the diagnosis of obesity with Samantha Lozano today and Hayslee agreed to give [name] permission to discuss obesity behavioral modification therapy today.  ASSESS: Shaneque has the diagnosis of obesity and her BMI today is @  36.21. Norvell is in the action stage of change.   ADVISE: Kherington was educated on the multiple health risks of obesity as well as the benefit of weight loss to improve her health. She was advised of the need for long term treatment and the importance of lifestyle modifications to improve her current health and to decrease her risk of future health problems.  AGREE: Multiple dietary modification options and treatment options were discussed and  Shania agreed to follow the recommendations documented in the above note.  ARRANGE: Katricia was educated on the importance of frequent visits to treat obesity as outlined per CMS and USPSTF guidelines and agreed to schedule her next follow up appointment today.  IMichaelene Lozano, am acting as Location manager for [employer], FNP-C.  I have reviewed the above documentation for accuracy and completeness, and I agree with the above.  - Samantha Dolloff, FNP-C.

## 2018-10-23 ENCOUNTER — Encounter (INDEPENDENT_AMBULATORY_CARE_PROVIDER_SITE_OTHER): Payer: Self-pay | Admitting: Family Medicine

## 2018-11-03 ENCOUNTER — Other Ambulatory Visit (INDEPENDENT_AMBULATORY_CARE_PROVIDER_SITE_OTHER): Payer: Self-pay | Admitting: Bariatrics

## 2018-11-03 DIAGNOSIS — E559 Vitamin D deficiency, unspecified: Secondary | ICD-10-CM

## 2018-11-10 ENCOUNTER — Ambulatory Visit (INDEPENDENT_AMBULATORY_CARE_PROVIDER_SITE_OTHER): Payer: PPO | Admitting: Bariatrics

## 2018-11-10 ENCOUNTER — Encounter (INDEPENDENT_AMBULATORY_CARE_PROVIDER_SITE_OTHER): Payer: Self-pay | Admitting: Bariatrics

## 2018-11-10 VITALS — BP 148/76 | HR 61 | Temp 98.1°F | Ht 62.0 in | Wt 192.0 lb

## 2018-11-10 DIAGNOSIS — Z6835 Body mass index (BMI) 35.0-35.9, adult: Secondary | ICD-10-CM

## 2018-11-10 DIAGNOSIS — E119 Type 2 diabetes mellitus without complications: Secondary | ICD-10-CM

## 2018-11-10 DIAGNOSIS — E559 Vitamin D deficiency, unspecified: Secondary | ICD-10-CM

## 2018-11-10 MED ORDER — VITAMIN D (ERGOCALCIFEROL) 1.25 MG (50000 UNIT) PO CAPS: 50000.0000 [IU] | ORAL_CAPSULE | ORAL | 0 refills | Status: DC

## 2018-11-10 NOTE — Progress Notes (Signed)
Office: [phone]  /  Fax: [phone]   HPI:   Chief Complaint: OBESITY Samantha Lozano is here to discuss her progress with her obesity treatment plan. She is on the Category 1 plan and is following her eating plan approximately 85 % of the time. She states she is walking for 45 minutes 6 times per week. Samantha Lozano is doing well. She has been able to get back on track. Her weight is 192 lb (87.1 kg) today and has had a weight loss of 6 pounds over a period of 3 weeks since her last visit. She has lost 45 lbs since starting treatment with [name].  Vitamin D deficiency Samantha Lozano has a diagnosis of vitamin D deficiency. She is currently taking high dose vit D and denies nausea, vomiting or muscle weakness.  Diabetes II Samantha Lozano has a diagnosis of diabetes type II. She is currently taking metformin. [name] states fasting BGs range between 102 and 126 and 2 hour post prandial BGs range in the 120's and she denies any lows. Last A1c was at 6.3 She has been working on intensive lifestyle modifications including diet, exercise, and weight loss to help control her blood glucose levels.  ASSESSMENT AND PLAN:  Vitamin D deficiency - Plan: Vitamin D, Ergocalciferol, (DRISDOL) 1.25 MG (50000 UT) CAPS capsule  Type 2 diabetes mellitus without complication, without long-term current use of insulin (HCC)  Class 2 severe obesity with serious comorbidity and body mass index (BMI) of 35.0 to 35.9 in adult, unspecified obesity type ([place])  PLAN:  Vitamin D Deficiency [name] was informed that low vitamin D levels contributes to fatigue and are associated with obesity, breast, and colon cancer. She agrees to continue to take prescription Vit D @50 ,000 IU every week #4 with no refills and will follow up for routine testing of vitamin D, at least 2-3 times per year. She was informed of the risk of over-replacement of vitamin D and agrees to not increase her dose unless she discusses this with [name] first. [name] agrees to follow  up with our clinic in 2 weeks.  Diabetes II Samantha Lozano has been given extensive diabetes education by myself today including ideal fasting and post-prandial blood glucose readings, individual ideal Hgb A1c goals and hypoglycemia prevention. We discussed the importance of good blood sugar control to decrease the likelihood of diabetic complications such as nephropathy, neuropathy, limb loss, blindness, coronary artery disease, and death. We discussed the importance of intensive lifestyle modification including diet, exercise and weight loss as the first line treatment for diabetes. Samantha Lozano agrees to continue metformin and follow up at the agreed upon time.  Obesity Samantha Lozano is currently in the action stage of change. As such, her goal is to continue with weight loss efforts She has agreed to follow the Category 1 plan Samantha Lozano has been instructed to work up to a goal of 150 minutes of combined cardio and strengthening exercise per week for weight loss and overall health benefits. We discussed the following Behavioral Modification Strategies today: increase H2O intake, no skipping meals, keeping healthy foods in the home, increasing lean protein intake, decreasing simple carbohydrates, increasing vegetables, decrease eating out and work on meal planning and easy cooking plans Ideas for meal plan dinners were provided to patient today.  Samantha Lozano has agreed to follow up with our clinic in 2 weeks fasting. She was informed of the importance of frequent follow up visits to maximize her success with intensive lifestyle modifications for her multiple health conditions.  ALLERGIES: Allergies  Allergen Reactions  .  Lisinopril Cough    MEDICATIONS: Current Outpatient Medications on File Prior to Visit  Medication Sig Dispense Refill  . aspirin EC 81 MG tablet Take 81 mg by mouth daily.    . carvedilol (COREG) 25 MG tablet Take 25 mg by mouth 2 (two) times daily with a meal.    . glucose blood test strip Check BS  twice daily 100 each 0  . hydrochlorothiazide (HYDRODIURIL) 25 MG tablet Take 25 mg by mouth daily.    Samantha Lozano levothyroxine (SYNTHROID, LEVOTHROID) 125 MCG tablet Take 125 mcg by mouth daily before breakfast.    . lovastatin (MEVACOR) 40 MG tablet Take 40 mg by mouth at bedtime.    . metFORMIN (GLUCOPHAGE-XR) 750 MG 24 hr tablet Take 1 tablet (750 mg total) by mouth daily with breakfast. (Patient taking differently: Take 750 mg by mouth daily with breakfast. Take 1 tablet twice a day.) 30 tablet 0  . Omega-3 Fatty Acids (FISH OIL) 435 MG CAPS Take 1 capsule by mouth daily at 2 PM.     No current facility-administered medications on file prior to visit.     PAST MEDICAL HISTORY: Past Medical History:  Diagnosis Date  . Diabetes mellitus, type 2 ([place])   . HLD (hyperlipidemia)   . HTN (hypertension)   . Leg edema   . Other specified disorders of thyroid     PAST SURGICAL HISTORY: Past Surgical History:  Procedure Laterality Date  . ABDOMINAL HYSTERECTOMY    . THYROID SURGERY      SOCIAL HISTORY: Social History   Tobacco Use  . Smoking status: Never Smoker  . Smokeless tobacco: Never Used  Substance Use Topics  . Alcohol use: Not Currently  . Drug use: Not Currently    FAMILY HISTORY: Family History  Problem Relation Age of Onset  . Diabetes Mother   . Hypertension Mother   . Cancer Mother   . Obesity Mother   . Hypertension Father   . Cancer Father   . Obesity Father     ROS: Review of Systems  Constitutional: Positive for weight loss.  Gastrointestinal: Negative for nausea and vomiting.  Musculoskeletal:       Negative for muscle weakness  Endo/Heme/Allergies:       Negative for hypoglycemia    PHYSICAL EXAM: Blood pressure (!) 148/76, pulse 61, temperature 98.1 F (36.7 C), temperature source Oral, height 5\' 2"  (1.575 m), weight 192 lb (87.1 kg), SpO2 99 %. Body mass index is 35.12 kg/m. Physical Exam Vitals signs reviewed.  Constitutional:       Appearance: Normal appearance. She is well-developed. She is obese.  Cardiovascular:     Rate and Rhythm: Normal rate.  Pulmonary:     Effort: Pulmonary effort is normal.  Musculoskeletal: Normal range of motion.  Skin:    General: Skin is warm and dry.  Neurological:     Mental Status: She is alert and oriented to person, place, and time.  Psychiatric:        Mood and Affect: Mood normal.        Behavior: Behavior normal.     RECENT LABS AND TESTS: BMET    Component Value Date/Time   NA 140 07/24/2018 1003   K 4.4 07/24/2018 1003   CL 100 07/24/2018 1003   CO2 25 07/24/2018 1003   GLUCOSE 117 (H) 07/24/2018 1003   GLUCOSE 126 (H) 06/13/2018 0902   BUN 20 07/24/2018 1003   CREATININE 0.74 07/24/2018 1003   CALCIUM 9.8 07/24/2018  1003   GFRNONAA 82 07/24/2018 1003   GFRAA 95 07/24/2018 1003   Lab Results  Component Value Date   HGBA1C 6.3 06/13/2018   HGBA1C 7.4 (H) 03/10/2018   Lab Results  Component Value Date   INSULIN 15.0 03/10/2018   CBC    Component Value Date/Time   WBC 6.5 03/10/2018 0839   WBC 10.3 09/24/2007 0515   RBC 4.47 03/10/2018 0839   RBC 3.89 09/24/2007 0515   HGB 12.1 03/10/2018 0839   HCT 37.6 03/10/2018 0839   PLT 208 09/24/2007 0515   MCV 84 03/10/2018 0839   MCH 27.1 03/10/2018 0839   MCHC 32.2 03/10/2018 0839   MCHC 34.6 09/24/2007 0515   RDW 14.7 03/10/2018 0839   LYMPHSABS 2.0 03/10/2018 0839   EOSABS 0.3 03/10/2018 0839   BASOSABS 0.0 03/10/2018 0839   Iron/TIBC/Ferritin/ %Sat No results found for: IRON, TIBC, FERRITIN, IRONPCTSAT Lipid Panel     Component Value Date/Time   CHOL 149 03/10/2018 0839   TRIG 125 03/10/2018 0839   HDL 42 03/10/2018 0839   LDLCALC 82 03/10/2018 0839   Hepatic Function Panel     Component Value Date/Time   PROT 7.4 07/24/2018 1003   ALBUMIN 4.7 07/24/2018 1003   AST 13 07/24/2018 1003   ALT 11 07/24/2018 1003   ALKPHOS 86 07/24/2018 1003   BILITOT 0.6 07/24/2018 1003      Component  Value Date/Time   TSH 1.300 03/10/2018 0839     Ref. Range 07/24/2018 10:03  Vitamin D, 25-Hydroxy Latest Ref Range: 30.0 - 100.0 ng/mL 56.8     OBESITY BEHAVIORAL INTERVENTION VISIT  Today's visit was # 16   Starting weight: 237 lbs Starting date: 03/10/2018 Today's weight : 192 lbs Today's date: 11/10/2018 Total lbs lost to date: 45 At least 15 minutes were spent on discussing the following behavioral intervention visit.    11/10/2018  Height 5\' 2"  (1.575 m)  Weight 192 lb (87.1 kg)  BMI (Calculated) 35.11  BLOOD PRESSURE - SYSTOLIC 017  BLOOD PRESSURE - DIASTOLIC 76   Body Fat % 79.3 %    ASK: We discussed the diagnosis of obesity with [name] today and Glendoris agreed to give [name] permission to discuss obesity behavioral modification therapy today.  ASSESS: Hoa has the diagnosis of obesity and her BMI today is 35.11 Siren is in the action stage of change   ADVISE: Anabelle was educated on the multiple health risks of obesity as well as the benefit of weight loss to improve her health. She was advised of the need for long term treatment and the importance of lifestyle modifications to improve her current health and to decrease her risk of future health problems.  AGREE: Multiple dietary modification options and treatment options were discussed and  Pina agreed to follow the recommendations documented in the above note.  ARRANGE: Marlene was educated on the importance of frequent visits to treat obesity as outlined per CMS and USPSTF guidelines and agreed to schedule her next follow up appointment today.  Corey Skains, am acting as Location manager for [employer]. Owens Shark, DO  I have reviewed the above documentation for accuracy and completeness, and I agree with the above. -Jearld Lesch, DO

## 2018-11-26 ENCOUNTER — Ambulatory Visit (INDEPENDENT_AMBULATORY_CARE_PROVIDER_SITE_OTHER): Payer: PPO | Admitting: Bariatrics

## 2018-11-27 ENCOUNTER — Ambulatory Visit (INDEPENDENT_AMBULATORY_CARE_PROVIDER_SITE_OTHER): Payer: PPO | Admitting: Bariatrics

## 2018-11-27 ENCOUNTER — Encounter (INDEPENDENT_AMBULATORY_CARE_PROVIDER_SITE_OTHER): Payer: Self-pay | Admitting: Bariatrics

## 2018-11-27 ENCOUNTER — Other Ambulatory Visit: Payer: Self-pay

## 2018-11-27 VITALS — BP 148/81 | HR 68 | Temp 97.6°F | Ht 62.0 in | Wt 193.0 lb

## 2018-11-27 DIAGNOSIS — E559 Vitamin D deficiency, unspecified: Secondary | ICD-10-CM

## 2018-11-27 DIAGNOSIS — E119 Type 2 diabetes mellitus without complications: Secondary | ICD-10-CM | POA: Diagnosis not present

## 2018-11-27 DIAGNOSIS — Z6835 Body mass index (BMI) 35.0-35.9, adult: Secondary | ICD-10-CM | POA: Diagnosis not present

## 2018-11-27 NOTE — Progress Notes (Signed)
Office: [phone]  /  Fax: [phone]   HPI:   Chief Complaint: OBESITY Lareina is here to discuss her progress with her obesity treatment plan. She is on the Category 1 plan and is following her eating plan approximately 85 % of the time. She states she is walking for 45 minutes 6 times per week. Shauntae is doing well overall, but she has gained one pound since her last visit. Kaisey had some craving for bread. Her weight is 193 lb (87.5 kg) today and has had a weight gain of 1 pound over a period of 2 weeks since her last visit. She has lost 44 lbs since starting treatment with [name].  Diabetes II Tehya has a diagnosis of diabetes type II. Dymond states fasting BGs range between 110 and 119 and she is not checking 2 hour post prandial BGs. She is taking metformin without any GI side effects. Last A1c was at 6.3 She has been working on intensive lifestyle modifications including diet, exercise, and weight loss to help control her blood glucose levels.  Vitamin D deficiency Gracynn has a diagnosis of vitamin D deficiency. Lorrena is taking vit D and she denies nausea, vomiting or muscle weakness.  ASSESSMENT AND PLAN:  Type 2 diabetes mellitus without complication, without long-term current use of insulin (HCC) - Plan: Comprehensive metabolic panel, Hemoglobin A1c, Insulin, random  Vitamin D deficiency - Plan: VITAMIN D 25 Hydroxy (Vit-D Deficiency, Fractures)  Class 2 severe obesity with serious comorbidity and body mass index (BMI) of 35.0 to 35.9 in adult, unspecified obesity type ([place])  PLAN:  Diabetes II Lawana has been given extensive diabetes education by myself today including ideal fasting and post-prandial blood glucose readings, individual ideal Hgb A1c goals and hypoglycemia prevention. We discussed the importance of good blood sugar control to decrease the likelihood of diabetic complications such as nephropathy, neuropathy, limb loss, blindness, coronary artery disease,  and death. We discussed the importance of intensive lifestyle modification including diet, exercise and weight loss as the first line treatment for diabetes. We will check Hgb A1c and insulin level today. Tallie will continue metformin and follow up at the agreed upon time.  Vitamin D Deficiency Joelly was informed that low vitamin D levels contributes to fatigue and are associated with obesity, breast, and colon cancer. She will continue OTC vitamin D @2 ,000 IU daily ([employer]) and stop high dose prescription Vitamin D. Yides will follow up for routine testing of vitamin D, at least 2-3 times per year. She was informed of the risk of over-replacement of vitamin D and agrees to not increase her dose unless she discusses this with [name] first. Shaunette agrees to follow up as directed.  Obesity Delinda is currently in the action stage of change. As such, her goal is to continue with weight loss efforts She has agreed to follow the Category 1 plan Loree has been instructed to increase her walking for weight loss and overall health benefits. We discussed the following Behavioral Modification Strategies today: increase H2O intake, no skipping meals, keeping healthy foods in the home, increasing lean protein intake, decreasing simple carbohydrates, increasing vegetables, decrease eating out and work on meal planning and easy cooking plans  Brendalee has agreed to follow up with our clinic in 2 weeks. She was informed of the importance of frequent follow up visits to maximize her success with intensive lifestyle modifications for her multiple health conditions.  ALLERGIES: Allergies  Allergen Reactions   Lisinopril Cough    MEDICATIONS:  Current Outpatient Medications on File Prior to Visit  Medication Sig Dispense Refill   aspirin EC 81 MG tablet Take 81 mg by mouth daily.     carvedilol (COREG) 25 MG tablet Take 25 mg by mouth 2 (two) times daily with a meal.     glucose blood test strip Check BS  twice daily 100 each 0   hydrochlorothiazide (HYDRODIURIL) 25 MG tablet Take 25 mg by mouth daily.     levothyroxine (SYNTHROID, LEVOTHROID) 125 MCG tablet Take 125 mcg by mouth daily before breakfast.     lovastatin (MEVACOR) 40 MG tablet Take 40 mg by mouth at bedtime.     metFORMIN (GLUCOPHAGE-XR) 750 MG 24 hr tablet Take 1 tablet (750 mg total) by mouth daily with breakfast. (Patient taking differently: Take 750 mg by mouth daily with breakfast. Take 1 tablet twice a day.) 30 tablet 0   Omega-3 Fatty Acids (FISH OIL) 435 MG CAPS Take 1 capsule by mouth daily at 2 PM.     No current facility-administered medications on file prior to visit.     PAST MEDICAL HISTORY: Past Medical History:  Diagnosis Date   Diabetes mellitus, type 2 (HCC)    HLD (hyperlipidemia)    HTN (hypertension)    Leg edema    Other specified disorders of thyroid     PAST SURGICAL HISTORY: Past Surgical History:  Procedure Laterality Date   ABDOMINAL HYSTERECTOMY     THYROID SURGERY      SOCIAL HISTORY: Social History   Tobacco Use   Smoking status: Never Smoker   Smokeless tobacco: Never Used  Substance Use Topics   Alcohol use: Not Currently   Drug use: Not Currently    FAMILY HISTORY: Family History  Problem Relation Age of Onset   Diabetes Mother    Hypertension Mother    Cancer Mother    Obesity Mother    Hypertension Father    Cancer Father    Obesity Father     ROS: Review of Systems  Constitutional: Negative for weight loss.  Gastrointestinal: Negative for diarrhea, nausea and vomiting.  Musculoskeletal:       Negative for muscle weakness  Endo/Heme/Allergies:       Negative for polyphagia    PHYSICAL EXAM: Blood pressure (!) 148/81, pulse 68, temperature 97.6 F (36.4 C), temperature source Oral, height 5\' 2"  (1.575 m), weight 193 lb (87.5 kg), SpO2 98 %. Body mass index is 35.3 kg/m. Physical Exam Vitals signs reviewed.  Constitutional:       Appearance: Normal appearance. She is well-developed. She is obese.  Cardiovascular:     Rate and Rhythm: Normal rate.  Pulmonary:     Effort: Pulmonary effort is normal.  Musculoskeletal: Normal range of motion.  Skin:    General: Skin is warm and dry.  Neurological:     Mental Status: She is alert and oriented to person, place, and time.  Psychiatric:        Mood and Affect: Mood normal.        Behavior: Behavior normal.     RECENT LABS AND TESTS: BMET    Component Value Date/Time   NA 140 07/24/2018 1003   K 4.4 07/24/2018 1003   CL 100 07/24/2018 1003   CO2 25 07/24/2018 1003   GLUCOSE 117 (H) 07/24/2018 1003   GLUCOSE 126 (H) 06/13/2018 0902   BUN 20 07/24/2018 1003   CREATININE 0.74 07/24/2018 1003   CALCIUM 9.8 07/24/2018 1003   GFRNONAA 82  07/24/2018 1003   GFRAA 95 07/24/2018 1003   Lab Results  Component Value Date   HGBA1C 6.3 06/13/2018   HGBA1C 7.4 (H) 03/10/2018   Lab Results  Component Value Date   INSULIN 15.0 03/10/2018   CBC    Component Value Date/Time   WBC 6.5 03/10/2018 0839   WBC 10.3 09/24/2007 0515   RBC 4.47 03/10/2018 0839   RBC 3.89 09/24/2007 0515   HGB 12.1 03/10/2018 0839   HCT 37.6 03/10/2018 0839   PLT 208 09/24/2007 0515   MCV 84 03/10/2018 0839   MCH 27.1 03/10/2018 0839   MCHC 32.2 03/10/2018 0839   MCHC 34.6 09/24/2007 0515   RDW 14.7 03/10/2018 0839   LYMPHSABS 2.0 03/10/2018 0839   EOSABS 0.3 03/10/2018 0839   BASOSABS 0.0 03/10/2018 0839   Iron/TIBC/Ferritin/ %Sat No results found for: IRON, TIBC, FERRITIN, IRONPCTSAT Lipid Panel     Component Value Date/Time   CHOL 149 03/10/2018 0839   TRIG 125 03/10/2018 0839   HDL 42 03/10/2018 0839   LDLCALC 82 03/10/2018 0839   Hepatic Function Panel     Component Value Date/Time   PROT 7.4 07/24/2018 1003   ALBUMIN 4.7 07/24/2018 1003   AST 13 07/24/2018 1003   ALT 11 07/24/2018 1003   ALKPHOS 86 07/24/2018 1003   BILITOT 0.6 07/24/2018 1003      Component  Value Date/Time   TSH 1.300 03/10/2018 0839   Results for AMAZING, COWMAN (MRN [id number]) as of 11/27/2018 13:52  Ref. Range 07/24/2018 10:03  Vitamin D, 25-Hydroxy Latest Ref Range: 30.0 - 100.0 ng/mL 56.8     OBESITY BEHAVIORAL INTERVENTION VISIT  Today's visit was # 17   Starting weight: 237 lbs Starting date: 03/10/2018 Today's weight : 193 lbs  Today's date: 11/27/2018 Total lbs lost to date: 40 At least 15 minutes were spent on discussing the following behavioral intervention visit.    11/27/2018  Height 5\' 2"  (1.575 m)  Weight 193 lb (87.5 kg)  BMI (Calculated) 35.29  BLOOD PRESSURE - SYSTOLIC 924  BLOOD PRESSURE - DIASTOLIC 81   Body Fat % 46.2 %    ASK: We discussed the diagnosis of obesity with [name] today and Jemeka agreed to give [name] permission to discuss obesity behavioral modification therapy today.  ASSESS: Sadey has the diagnosis of obesity and her BMI today is 35.29 Cassandria is in the action stage of change   ADVISE: Lashaya was educated on the multiple health risks of obesity as well as the benefit of weight loss to improve her health. She was advised of the need for long term treatment and the importance of lifestyle modifications to improve her current health and to decrease her risk of future health problems.  AGREE: Multiple dietary modification options and treatment options were discussed and  Michaila agreed to follow the recommendations documented in the above note.  ARRANGE: Ronnetta was educated on the importance of frequent visits to treat obesity as outlined per CMS and USPSTF guidelines and agreed to schedule her next follow up appointment today.  Corey Skains, am acting as Location manager for [employer]. Owens Shark, DO

## 2018-11-28 LAB — COMPREHENSIVE METABOLIC PANEL
ALBUMIN: 4.6 g/dL (ref 3.8–4.8)
ALT: 11 IU/L (ref 0–32)
AST: 15 IU/L (ref 0–40)
Albumin/Globulin Ratio: 1.6 (ref 1.2–2.2)
Alkaline Phosphatase: 79 IU/L (ref 39–117)
BUN/Creatinine Ratio: 22 (ref 12–28)
BUN: 19 mg/dL (ref 8–27)
Bilirubin Total: 0.6 mg/dL (ref 0.0–1.2)
CO2: 25 mmol/L (ref 20–29)
CREATININE: 0.88 mg/dL (ref 0.57–1.00)
Calcium: 10.1 mg/dL (ref 8.7–10.3)
Chloride: 100 mmol/L (ref 96–106)
GFR, EST AFRICAN AMERICAN: 77 mL/min/{1.73_m2} (ref >59)
GFR, EST NON AFRICAN AMERICAN: 67 mL/min/{1.73_m2} (ref >59)
GLUCOSE: 120 mg/dL — AB (ref 65–99)
Globulin, Total: 2.9 g/dL (ref 1.5–4.5)
POTASSIUM: 4.7 mmol/L (ref 3.5–5.2)
SODIUM: 141 mmol/L (ref 134–144)
Total Protein: 7.5 g/dL (ref 6.0–8.5)

## 2018-11-28 LAB — HEMOGLOBIN A1C
ESTIMATED AVERAGE GLUCOSE: 128 mg/dL
Hgb A1c MFr Bld: 6.1 % — ABNORMAL HIGH (ref 4.8–5.6)

## 2018-11-28 LAB — VITAMIN D 25 HYDROXY (VIT D DEFICIENCY, FRACTURES): Vit D, 25-Hydroxy: 45.7 ng/mL (ref 30.0–100.0)

## 2018-11-28 LAB — INSULIN, RANDOM: INSULIN: 9.1 u[IU]/mL (ref 2.6–24.9)

## 2018-12-16 ENCOUNTER — Ambulatory Visit (INDEPENDENT_AMBULATORY_CARE_PROVIDER_SITE_OTHER): Payer: PPO | Admitting: Bariatrics

## 2019-01-08 ENCOUNTER — Other Ambulatory Visit: Payer: Self-pay

## 2019-01-08 ENCOUNTER — Encounter (INDEPENDENT_AMBULATORY_CARE_PROVIDER_SITE_OTHER): Payer: Self-pay | Admitting: Bariatrics

## 2019-01-08 ENCOUNTER — Ambulatory Visit (INDEPENDENT_AMBULATORY_CARE_PROVIDER_SITE_OTHER): Payer: PPO | Admitting: Bariatrics

## 2019-01-08 DIAGNOSIS — E119 Type 2 diabetes mellitus without complications: Secondary | ICD-10-CM | POA: Diagnosis not present

## 2019-01-08 DIAGNOSIS — E7849 Other hyperlipidemia: Secondary | ICD-10-CM

## 2019-01-08 DIAGNOSIS — Z6835 Body mass index (BMI) 35.0-35.9, adult: Secondary | ICD-10-CM

## 2019-01-08 DIAGNOSIS — E559 Vitamin D deficiency, unspecified: Secondary | ICD-10-CM | POA: Diagnosis not present

## 2019-01-12 NOTE — Progress Notes (Signed)
Office: [phone]  /  Fax: [phone] TeleHealth Visit:  Samantha Lozano has verbally consented to this TeleHealth visit today. The patient is located at home, the provider is located at the [facility] office. The participants in this visit include the listed provider and patient and any and all parties involved. The visit was conducted today via telephone. Brit was unable to use realtime audiovisual technology today and the telehealth visit was conducted via telephone.  HPI:   Chief Complaint: OBESITY Samantha Lozano is here to discuss her progress with her obesity treatment plan. She is on the Category 1 plan and is following her eating plan approximately 75 % of the time. She states she is walking for 2 hours 7 times per week. Samantha Lozano is unsure if she has lost weight or not (weight 189 lbs). She thinks that she has lost weight. Samantha Lozano has done some minor stress eating. We were unable to weigh the patient today for this TeleHealth visit. She feels unsure if she has lost weight since her last visit. She has lost 48 lbs since starting treatment with [name].  Diabetes II Samantha Lozano has a diagnosis of diabetes type II. She is taking Glucophage XR. Samantha Lozano states fasting BGs range between 100 and 115 (no low blood sugars). Last A1c was at 6.1 and last insulin level was at 9.1 She has been working on intensive lifestyle modifications including diet, exercise, and weight loss to help control her blood glucose levels.  Vitamin D deficiency Samantha Lozano has a diagnosis of vitamin D deficiency. She is not currently taking vit D. Her last vitamin D level was at 45.7 and she is currently well controlled. Reizel denies nausea, vomiting or muscle weakness.  Hyperlipidemia Samantha Lozano has hyperlipidemia and she needs her LDL to be less than 70 due to diabetes (taking Mevacor and fish oil). She has been trying to improve her cholesterol levels with intensive lifestyle modification including a low saturated fat  diet, exercise and weight loss. She denies myalgias.  ASSESSMENT AND PLAN:  Type 2 diabetes mellitus without complication, without long-term current use of insulin (HCC)  Vitamin D deficiency  Other hyperlipidemia  Class 2 severe obesity with serious comorbidity and body mass index (BMI) of 35.0 to 35.9 in adult, unspecified obesity type ([place])  PLAN:  Diabetes II Samantha Lozano has been given extensive diabetes education by myself today including ideal fasting and post-prandial blood glucose readings, individual ideal Hgb A1c goals and hypoglycemia prevention. We discussed the importance of good blood sugar control to decrease the likelihood of diabetic complications such as nephropathy, neuropathy, limb loss, blindness, coronary artery disease, and death. We discussed the importance of intensive lifestyle modification including diet, exercise and weight loss as the first line treatment for diabetes. Samantha Lozano agrees to continue her diabetes medications and will follow up at the agreed upon time.  Vitamin D Deficiency Samantha Lozano was informed that low vitamin D levels contributes to fatigue and are associated with obesity, breast, and colon cancer. Samantha Lozano agrees to begin OTC Vit D @2 ,000 IU daily and she will follow up for routine testing of vitamin D, at least 2-3 times per year. She was informed of the risk of over-replacement of vitamin D and agrees to not increase her dose unless she discusses this with [name] first. Samantha Lozano agrees to follow up as directed.  Hyperlipidemia Samantha Lozano was informed of the American Heart Association Guidelines emphasizing intensive lifestyle modifications as the first line treatment for hyperlipidemia. We discussed many lifestyle modifications today in depth,  and Samantha Lozano will continue to work on decreasing saturated fats such as fatty red meat, butter and many fried foods. She will also increase vegetables and lean protein in her diet and continue to work on exercise and weight loss  efforts. Samantha Lozano agrees to continue medications and follow up as directed.  Obesity Samantha Lozano is currently in the action stage of change. As such, her goal is to continue with weight loss efforts She has agreed to follow the Category 1 plan Samantha Lozano will continue exercise (walking) for weight loss and overall health benefits. We discussed the following Behavioral Modification Strategies today: planning for success, increase H2O intake, no skipping meals, keeping healthy foods in the home, increasing lean protein intake, decreasing simple carbohydrates, increasing vegetables, decrease eating out and work on meal planning and easy cooking plans Samantha Lozano will weigh herself at home before each visit. We reviewed labs in detail today.  Samantha Lozano has agreed to follow up with our clinic in 2 weeks. She was informed of the importance of frequent follow up visits to maximize her success with intensive lifestyle modifications for her multiple health conditions.  ALLERGIES: Allergies  Allergen Reactions  . Lisinopril Cough    MEDICATIONS: Current Outpatient Medications on File Prior to Visit  Medication Sig Dispense Refill  . aspirin EC 81 MG tablet Take 81 mg by mouth daily.    . carvedilol (COREG) 25 MG tablet Take 25 mg by mouth 2 (two) times daily with a meal.    . glucose blood test strip Check BS twice daily 100 each 0  . hydrochlorothiazide (HYDRODIURIL) 25 MG tablet Take 25 mg by mouth daily.    [name] levothyroxine (SYNTHROID, LEVOTHROID) 125 MCG tablet Take 125 mcg by mouth daily before breakfast.    . lovastatin (MEVACOR) 40 MG tablet Take 40 mg by mouth at bedtime.    . metFORMIN (GLUCOPHAGE-XR) 750 MG 24 hr tablet Take 1 tablet (750 mg total) by mouth daily with breakfast. (Patient taking differently: Take 750 mg by mouth daily with breakfast. Take 1 tablet twice a day.) 30 tablet 0  . Omega-3 Fatty Acids (FISH OIL) 435 MG CAPS Take 1 capsule by mouth daily at 2 PM.     No current  facility-administered medications on file prior to visit.     PAST MEDICAL HISTORY: Past Medical History:  Diagnosis Date  . Diabetes mellitus, type 2 ([name])   . HLD (hyperlipidemia)   . HTN (hypertension)   . Leg edema   . Other specified disorders of thyroid     PAST SURGICAL HISTORY: Past Surgical History:  Procedure Laterality Date  . ABDOMINAL HYSTERECTOMY    . THYROID SURGERY      SOCIAL HISTORY: Social History   Tobacco Use  . Smoking status: Never Smoker  . Smokeless tobacco: Never Used  Substance Use Topics  . Alcohol use: Not Currently  . Drug use: Not Currently    FAMILY HISTORY: Family History  Problem Relation Age of Onset  . Diabetes Mother   . Hypertension Mother   . Cancer Mother   . Obesity Mother   . Hypertension Father   . Cancer Father   . Obesity Father     ROS: Review of Systems  Gastrointestinal: Negative for nausea and vomiting.  Musculoskeletal: Negative for myalgias.       Negative for muscle weakness    PHYSICAL EXAM: Pt in no acute distress  RECENT LABS AND TESTS: BMET    Component Value Date/Time   NA 141  11/27/2018 1027   K 4.7 11/27/2018 1027   CL 100 11/27/2018 1027   CO2 25 11/27/2018 1027   GLUCOSE 120 (H) 11/27/2018 1027   GLUCOSE 126 (H) 06/13/2018 0902   BUN 19 11/27/2018 1027   CREATININE 0.88 11/27/2018 1027   CALCIUM 10.1 11/27/2018 1027   GFRNONAA 67 11/27/2018 1027   GFRAA 77 11/27/2018 1027   Lab Results  Component Value Date   HGBA1C 6.1 (H) 11/27/2018   HGBA1C 6.3 06/13/2018   HGBA1C 7.4 (H) 03/10/2018   Lab Results  Component Value Date   INSULIN 9.1 11/27/2018   INSULIN 15.0 03/10/2018   CBC    Component Value Date/Time   WBC 6.5 03/10/2018 0839   WBC 10.3 09/24/2007 0515   RBC 4.47 03/10/2018 0839   RBC 3.89 09/24/2007 0515   HGB 12.1 03/10/2018 0839   HCT 37.6 03/10/2018 0839   PLT 208 09/24/2007 0515   MCV 84 03/10/2018 0839   MCH 27.1 03/10/2018 0839   MCHC 32.2 03/10/2018  0839   MCHC 34.6 09/24/2007 0515   RDW 14.7 03/10/2018 0839   LYMPHSABS 2.0 03/10/2018 0839   EOSABS 0.3 03/10/2018 0839   BASOSABS 0.0 03/10/2018 0839   Iron/TIBC/Ferritin/ %Sat No results found for: IRON, TIBC, FERRITIN, IRONPCTSAT Lipid Panel     Component Value Date/Time   CHOL 149 03/10/2018 0839   TRIG 125 03/10/2018 0839   HDL 42 03/10/2018 0839   LDLCALC 82 03/10/2018 0839   Hepatic Function Panel     Component Value Date/Time   PROT 7.5 11/27/2018 1027   ALBUMIN 4.6 11/27/2018 1027   AST 15 11/27/2018 1027   ALT 11 11/27/2018 1027   ALKPHOS 79 11/27/2018 1027   BILITOT 0.6 11/27/2018 1027      Component Value Date/Time   TSH 1.300 03/10/2018 0839     Ref. Range 11/27/2018 10:27  Vitamin D, 25-Hydroxy Latest Ref Range: 30.0 - 100.0 ng/mL 45.7    I, Doreene Nest, am acting as Location manager for [employer]. Owens Shark, DO  I have reviewed the above documentation for accuracy and completeness, and I agree with the above. -Jearld Lesch, DO

## 2019-01-22 ENCOUNTER — Ambulatory Visit (INDEPENDENT_AMBULATORY_CARE_PROVIDER_SITE_OTHER): Payer: PPO | Admitting: Bariatrics

## 2019-01-22 ENCOUNTER — Other Ambulatory Visit: Payer: Self-pay

## 2019-01-22 DIAGNOSIS — E119 Type 2 diabetes mellitus without complications: Secondary | ICD-10-CM

## 2019-01-22 DIAGNOSIS — E7849 Other hyperlipidemia: Secondary | ICD-10-CM | POA: Diagnosis not present

## 2019-01-22 DIAGNOSIS — Z6835 Body mass index (BMI) 35.0-35.9, adult: Secondary | ICD-10-CM | POA: Diagnosis not present

## 2019-01-26 ENCOUNTER — Encounter (INDEPENDENT_AMBULATORY_CARE_PROVIDER_SITE_OTHER): Payer: Self-pay | Admitting: Bariatrics

## 2019-01-26 NOTE — Progress Notes (Signed)
Office: [phone]  /  Fax: [phone] TeleHealth Visit:  Samantha Lozano has verbally consented to this TeleHealth visit today. The patient is located at home, the provider is located at the [employer] [facility] office. The participants in this visit include the listed provider and patient and any and all parties involved. The visit was conducted today via telephone. Samantha Lozano was unable to use realtime audiovisual technology today and the telehealth visit was conducted via telephone.  HPI:   Chief Complaint: OBESITY Samantha Lozano is here to discuss her progress with her obesity treatment plan. She is on the Category 1 plan and is following her eating plan approximately 75 % of the time. She states she is walking 3 miles 7 times per week. Samantha Lozano states that she has stayed the same weight. She is doing very well overall and she has increased her exercise. Samantha Lozano has sciatica nerve pain. We were unable to weigh the patient today for this TeleHealth visit. She feels as if she has maintained weight since her last visit. She has lost 48 lbs since starting treatment with [name].  Diabetes II Samantha Lozano has a diagnosis of diabetes type II. Samantha Lozano states blood sugars range between 110 and 120 and she denies any hypoglycemic episodes. Last A1c was at 6.1 She has been working on intensive lifestyle modifications including diet, exercise, and weight loss to help control her blood glucose levels.  Hyperlipidemia Samantha Lozano has hyperlipidemia and she is taking fish oil and Mevacor currently. She has been trying to improve her cholesterol levels with intensive lifestyle modification including a low saturated fat diet, exercise and weight loss. She denies myalgias.  ASSESSMENT AND PLAN:  Type 2 diabetes mellitus without complication, without long-term current use of insulin (HCC)  Other hyperlipidemia  Class 2 severe obesity with serious comorbidity and body mass index (BMI) of 35.0 to 35.9 in adult, unspecified  obesity type (Samantha Lozano)  PLAN:  Diabetes II Samantha Lozano has been given extensive diabetes education by myself today including ideal fasting and post-prandial blood glucose readings, individual ideal Hgb A1c goals and hypoglycemia prevention. We discussed the importance of good blood sugar control to decrease the likelihood of diabetic complications such as nephropathy, neuropathy, limb loss, blindness, coronary artery disease, and death. We discussed the importance of intensive lifestyle modification including diet, exercise and weight loss as the first line treatment for diabetes. She will continue to work on decreasing simple carbohydrates and increasing lean protein in her diet. She will continue exercise. Samantha Lozano agrees to continue her diabetes medications and checking her blood sugars at home. She will follow up at the agreed upon time.  Hyperlipidemia Samantha Lozano was informed of the American Heart Association Guidelines emphasizing intensive lifestyle modifications as the first line treatment for hyperlipidemia. We discussed many lifestyle modifications today in depth, and Samantha Lozano will continue to work on decreasing saturated fats such as fatty red meat, butter and many fried foods. She will also increase MUFA's, PUFA's, vegetables and lean protein in her diet and continue to work on exercise and weight loss efforts. Samantha Lozano will continue her medications and will follow up as directed.  Obesity Samantha Lozano is currently in the action stage of change. As such, her goal is to continue with weight loss efforts She has agreed to follow the Category 1 plan Samantha Lozano will continue activity (excellent walking) for weight loss and overall health benefits. Samantha Lozano will stretch before walking. We discussed the following Behavioral Modification Strategies today: planning for success, increase H2O intake, no skipping meals, keeping  healthy foods in the home, better snacking choices, increasing lean protein intake, decreasing simple  carbohydrates, increasing vegetables, decrease eating out, work on meal planning and easy cooking plans, emotional eating strategies, ways to avoid boredom eating, ways to avoid night time snacking and avoiding temptations Samantha Lozano will weigh herself at home before each visit.  Samantha Lozano has agreed to follow up with our clinic in 2 weeks. She was informed of the importance of frequent follow up visits to maximize her success with intensive lifestyle modifications for her multiple health conditions.  ALLERGIES: Allergies  Allergen Reactions  . Lisinopril Cough    MEDICATIONS: Current Outpatient Medications on File Prior to Visit  Medication Sig Dispense Refill  . aspirin EC 81 MG tablet Take 81 mg by mouth daily.    . carvedilol (COREG) 25 MG tablet Take 25 mg by mouth 2 (two) times daily with a meal.    . glucose blood test strip Check BS twice daily 100 each 0  . hydrochlorothiazide (HYDRODIURIL) 25 MG tablet Take 25 mg by mouth daily.    [name] levothyroxine (SYNTHROID, LEVOTHROID) 125 MCG tablet Take 125 mcg by mouth daily before breakfast.    . lovastatin (MEVACOR) 40 MG tablet Take 40 mg by mouth at bedtime.    . metFORMIN (GLUCOPHAGE-XR) 750 MG 24 hr tablet Take 1 tablet (750 mg total) by mouth daily with breakfast. (Patient taking differently: Take 750 mg by mouth daily with breakfast. Take 1 tablet twice a day.) 30 tablet 0  . Omega-3 Fatty Acids (FISH OIL) 435 MG CAPS Take 1 capsule by mouth daily at 2 PM.     No current facility-administered medications on file prior to visit.     PAST MEDICAL HISTORY: Past Medical History:  Diagnosis Date  . Diabetes mellitus, type 2 ([place])   . HLD (hyperlipidemia)   . HTN (hypertension)   . Leg edema   . Other specified disorders of thyroid     PAST SURGICAL HISTORY: Past Surgical History:  Procedure Laterality Date  . ABDOMINAL HYSTERECTOMY    . THYROID SURGERY      SOCIAL HISTORY: Social History   Tobacco Use  . Smoking status: Never  Smoker  . Smokeless tobacco: Never Used  Substance Use Topics  . Alcohol use: Not Currently  . Drug use: Not Currently    FAMILY HISTORY: Family History  Problem Relation Age of Onset  . Diabetes Mother   . Hypertension Mother   . Cancer Mother   . Obesity Mother   . Hypertension Father   . Cancer Father   . Obesity Father     ROS: Review of Systems  Constitutional: Negative for weight loss.  Musculoskeletal: Negative for myalgias.  Endo/Heme/Allergies:       Negative for hypoglycemia    PHYSICAL EXAM: Pt in no acute distress  RECENT LABS AND TESTS: BMET    Component Value Date/Time   NA 141 11/27/2018 1027   K 4.7 11/27/2018 1027   CL 100 11/27/2018 1027   CO2 25 11/27/2018 1027   GLUCOSE 120 (H) 11/27/2018 1027   GLUCOSE 126 (H) 06/13/2018 0902   BUN 19 11/27/2018 1027   CREATININE 0.88 11/27/2018 1027   CALCIUM 10.1 11/27/2018 1027   GFRNONAA 67 11/27/2018 1027   GFRAA 77 11/27/2018 1027   Lab Results  Component Value Date   HGBA1C 6.1 (H) 11/27/2018   HGBA1C 6.3 06/13/2018   HGBA1C 7.4 (H) 03/10/2018   Lab Results  Component Value Date  INSULIN 9.1 11/27/2018   INSULIN 15.0 03/10/2018   CBC    Component Value Date/Time   WBC 6.5 03/10/2018 0839   WBC 10.3 09/24/2007 0515   RBC 4.47 03/10/2018 0839   RBC 3.89 09/24/2007 0515   HGB 12.1 03/10/2018 0839   HCT 37.6 03/10/2018 0839   PLT 208 09/24/2007 0515   MCV 84 03/10/2018 0839   MCH 27.1 03/10/2018 0839   MCHC 32.2 03/10/2018 0839   MCHC 34.6 09/24/2007 0515   RDW 14.7 03/10/2018 0839   LYMPHSABS 2.0 03/10/2018 0839   EOSABS 0.3 03/10/2018 0839   BASOSABS 0.0 03/10/2018 0839   Iron/TIBC/Ferritin/ %Sat No results found for: IRON, TIBC, FERRITIN, IRONPCTSAT Lipid Panel     Component Value Date/Time   CHOL 149 03/10/2018 0839   TRIG 125 03/10/2018 0839   HDL 42 03/10/2018 0839   LDLCALC 82 03/10/2018 0839   Hepatic Function Panel     Component Value Date/Time   PROT 7.5  11/27/2018 1027   ALBUMIN 4.6 11/27/2018 1027   AST 15 11/27/2018 1027   ALT 11 11/27/2018 1027   ALKPHOS 79 11/27/2018 1027   BILITOT 0.6 11/27/2018 1027      Component Value Date/Time   TSH 1.300 03/10/2018 0839     Ref. Range 11/27/2018 10:27  Vitamin D, 25-Hydroxy Latest Ref Range: 30.0 - 100.0 ng/mL 45.7    I, Samantha Lozano, am acting as Location manager for [employer]. Owens Shark, DO  I have reviewed the above documentation for accuracy and completeness, and I agree with the above. -Jearld Lesch, DO

## 2019-02-05 ENCOUNTER — Ambulatory Visit (INDEPENDENT_AMBULATORY_CARE_PROVIDER_SITE_OTHER): Payer: PPO | Admitting: Bariatrics

## 2019-02-05 ENCOUNTER — Other Ambulatory Visit: Payer: Self-pay

## 2019-02-05 DIAGNOSIS — E119 Type 2 diabetes mellitus without complications: Secondary | ICD-10-CM

## 2019-02-05 DIAGNOSIS — Z6835 Body mass index (BMI) 35.0-35.9, adult: Secondary | ICD-10-CM | POA: Diagnosis not present

## 2019-02-05 DIAGNOSIS — E7849 Other hyperlipidemia: Secondary | ICD-10-CM

## 2019-02-08 NOTE — Progress Notes (Signed)
Office: [phone]  /  Fax: [phone] TeleHealth Visit:  Samantha Lozano has verbally consented to this TeleHealth visit today. The patient is located at home, the provider is located at the [employer] [facility] office. The participants in this visit include the listed provider and patient. Deborha was unable to use realtime audiovisual technology today and the telehealth visit was conducted via telephone.   HPI:   Chief Complaint: OBESITY Samantha Lozano is here to discuss her progress with her obesity treatment plan. She is on the Category 1 plan and is following her eating plan approximately 85 % of the time. She states she is walking 3 miles 7 times per week. Cherre states that she is doing well. She states that her weight is the same. She denies any struggles except for certain foods.  We were unable to weigh the patient today for this TeleHealth visit. She feels as if she has maintained her weight since her last visit. She has lost 48 lbs since starting treatment with [name].  Diabetes II Samantha Lozano has a diagnosis of diabetes type II. Donya states her fasting BGs average at 100, no low blood sugars. She is taking Glucophage and she denies hypoglycemia. Last A1c was 6.1 and insulin of 9.1. She has been working on intensive lifestyle modifications including diet, exercise, and weight loss to help control her blood glucose levels.  Hyperlipidemia Samantha Lozano has hyperlipidemia and has been trying to improve her cholesterol levels with intensive lifestyle modification including a low saturated fat diet, exercise and weight loss. She is taking fish oil and Mevacor. She denies any chest pain, claudication or myalgias.  ASSESSMENT AND PLAN:  Type 2 diabetes mellitus without complication, without long-term current use of insulin (HCC)  Other hyperlipidemia  Class 2 severe obesity with serious comorbidity and body mass index (BMI) of 35.0 to 35.9 in adult, unspecified obesity type ([place])  PLAN:   Diabetes II Samantha Lozano has been given extensive diabetes education by myself today including ideal fasting and post-prandial blood glucose readings, individual ideal Hgb A1c goals and hypoglycemia prevention. We discussed the importance of good blood sugar control to decrease the likelihood of diabetic complications such as nephropathy, neuropathy, limb loss, blindness, coronary artery disease, and death. We discussed the importance of intensive lifestyle modification including diet, exercise and weight loss as the first line treatment for diabetes. Samantha Lozano agrees to continue her diabetes medications and will continue to check her BGs. Samantha Lozano agrees to follow up with our clinic in 2 weeks.  Hyperlipidemia Samantha Lozano was informed of the American Heart Association Guidelines emphasizing intensive lifestyle modifications as the first line treatment for hyperlipidemia. We discussed many lifestyle modifications today in depth, and Samantha Lozano will continue to work on decreasing saturated fats such as fatty red meat, butter and many fried foods. She will also increase vegetables and lean protein in her diet and continue to work on exercise and weight loss efforts. Samantha Lozano agrees to continue taking her medications, and she agrees to follow up with our clinic in 2 weeks.  Obesity Samantha Lozano is currently in the action stage of change. As such, her goal is to continue with weight loss efforts She has agreed to follow the Category 1 plan Samantha Lozano has been instructed to work up to a goal of 150 minutes of combined cardio and strengthening exercise per week or continue activity and walking for weight loss and overall health benefits. We discussed the following Behavioral Modification Strategies today: increasing lean protein intake, decreasing simple carbohydrates, increasing vegetables,  decrease eating out, work on meal planning and easy cooking plans, increase H20 intake, no skipping meals, and keeping healthy foods in the home Samantha Lozano  will weigh herself at home and keep a record. She is going to have labs [facility] at [facility] and she will send labs to [name].  Samantha Lozano has agreed to follow up with our clinic in 2 weeks. She was informed of the importance of frequent follow up visits to maximize her success with intensive lifestyle modifications for her multiple health conditions.  ALLERGIES: Allergies  Allergen Reactions  . Lisinopril Cough    MEDICATIONS: Current Outpatient Medications on File Prior to Visit  Medication Sig Dispense Refill  . aspirin EC 81 MG tablet Take 81 mg by mouth daily.    . carvedilol (COREG) 25 MG tablet Take 25 mg by mouth 2 (two) times daily with a meal.    . glucose blood test strip Check BS twice daily 100 each 0  . hydrochlorothiazide (HYDRODIURIL) 25 MG tablet Take 25 mg by mouth daily.    [name] levothyroxine (SYNTHROID, LEVOTHROID) 125 MCG tablet Take 125 mcg by mouth daily before breakfast.    . lovastatin (MEVACOR) 40 MG tablet Take 40 mg by mouth at bedtime.    . metFORMIN (GLUCOPHAGE-XR) 750 MG 24 hr tablet Take 1 tablet (750 mg total) by mouth daily with breakfast. (Patient taking differently: Take 750 mg by mouth daily with breakfast. Take 1 tablet twice a day.) 30 tablet 0  . Omega-3 Fatty Acids (FISH OIL) 435 MG CAPS Take 1 capsule by mouth daily at 2 PM.     No current facility-administered medications on file prior to visit.     PAST MEDICAL HISTORY: Past Medical History:  Diagnosis Date  . Diabetes mellitus, type 2 ([place])   . HLD (hyperlipidemia)   . HTN (hypertension)   . Leg edema   . Other specified disorders of thyroid     PAST SURGICAL HISTORY: Past Surgical History:  Procedure Laterality Date  . ABDOMINAL HYSTERECTOMY    . THYROID SURGERY      SOCIAL HISTORY: Social History   Tobacco Use  . Smoking status: Never Smoker  . Smokeless tobacco: Never Used  Substance Use Topics  . Alcohol use: Not Currently  . Drug use: Not Currently    FAMILY HISTORY: Family History   Problem Relation Age of Onset  . Diabetes Mother   . Hypertension Mother   . Cancer Mother   . Obesity Mother   . Hypertension Father   . Cancer Father   . Obesity Father     ROS: Review of Systems  Constitutional: Negative for weight loss.  Cardiovascular: Negative for chest pain and claudication.  Musculoskeletal: Negative for myalgias.  Endo/Heme/Allergies:       Negative hypoglycemia    PHYSICAL EXAM: Pt in no acute distress  RECENT LABS AND TESTS: BMET    Component Value Date/Time   NA 141 11/27/2018 1027   K 4.7 11/27/2018 1027   CL 100 11/27/2018 1027   CO2 25 11/27/2018 1027   GLUCOSE 120 (H) 11/27/2018 1027   GLUCOSE 126 (H) 06/13/2018 0902   BUN 19 11/27/2018 1027   CREATININE 0.88 11/27/2018 1027   CALCIUM 10.1 11/27/2018 1027   GFRNONAA 67 11/27/2018 1027   GFRAA 77 11/27/2018 1027   Lab Results  Component Value Date   HGBA1C 6.1 (H) 11/27/2018   HGBA1C 6.3 06/13/2018   HGBA1C 7.4 (H) 03/10/2018   Lab Results  Component Value  Date   INSULIN 9.1 11/27/2018   INSULIN 15.0 03/10/2018   CBC    Component Value Date/Time   WBC 6.5 03/10/2018 0839   WBC 10.3 09/24/2007 0515   RBC 4.47 03/10/2018 0839   RBC 3.89 09/24/2007 0515   HGB 12.1 03/10/2018 0839   HCT 37.6 03/10/2018 0839   PLT 208 09/24/2007 0515   MCV 84 03/10/2018 0839   MCH 27.1 03/10/2018 0839   MCHC 32.2 03/10/2018 0839   MCHC 34.6 09/24/2007 0515   RDW 14.7 03/10/2018 0839   LYMPHSABS 2.0 03/10/2018 0839   EOSABS 0.3 03/10/2018 0839   BASOSABS 0.0 03/10/2018 0839   Iron/TIBC/Ferritin/ %Sat No results found for: IRON, TIBC, FERRITIN, IRONPCTSAT Lipid Panel     Component Value Date/Time   CHOL 149 03/10/2018 0839   TRIG 125 03/10/2018 0839   HDL 42 03/10/2018 0839   LDLCALC 82 03/10/2018 0839   Hepatic Function Panel     Component Value Date/Time   PROT 7.5 11/27/2018 1027   ALBUMIN 4.6 11/27/2018 1027   AST 15 11/27/2018 1027   ALT 11 11/27/2018 1027   ALKPHOS  79 11/27/2018 1027   BILITOT 0.6 11/27/2018 1027      Component Value Date/Time   TSH 1.300 03/10/2018 0839      I, Trixie Dredge, am acting as transcriptionist for Jearld Lesch, DO  I have reviewed the above documentation for accuracy and completeness, and I agree with the above. Jearld Lesch, DO

## 2019-02-10 ENCOUNTER — Encounter (INDEPENDENT_AMBULATORY_CARE_PROVIDER_SITE_OTHER): Payer: Self-pay | Admitting: Bariatrics

## 2019-02-19 ENCOUNTER — Other Ambulatory Visit: Payer: Self-pay

## 2019-02-19 ENCOUNTER — Encounter (INDEPENDENT_AMBULATORY_CARE_PROVIDER_SITE_OTHER): Payer: Self-pay | Admitting: Bariatrics

## 2019-02-19 ENCOUNTER — Ambulatory Visit (INDEPENDENT_AMBULATORY_CARE_PROVIDER_SITE_OTHER): Payer: PPO | Admitting: Bariatrics

## 2019-02-19 DIAGNOSIS — E119 Type 2 diabetes mellitus without complications: Secondary | ICD-10-CM | POA: Diagnosis not present

## 2019-02-19 DIAGNOSIS — Z6835 Body mass index (BMI) 35.0-35.9, adult: Secondary | ICD-10-CM | POA: Diagnosis not present

## 2019-02-19 DIAGNOSIS — E7849 Other hyperlipidemia: Secondary | ICD-10-CM

## 2019-02-23 NOTE — Progress Notes (Signed)
Office: [phone]  /  Fax: [phone] TeleHealth Visit:  Samantha Lozano has verbally consented to this TeleHealth visit today. The patient is located at home, the provider is located at the [facility] office. The participants in this visit include the listed provider and patient and any and all parties involved. The visit was conducted today via telephone ( 20 minutes ). Samantha Lozano was unable to use realtime audiovisual technology today and the telehealth visit was conducted via telephone.  HPI:   Chief Complaint: OBESITY Samantha Lozano is here to discuss her progress with her obesity treatment plan. She is on the Category 1 plan and is following her eating plan approximately 75 % of the time. She states she is walking 3 miles 7 days per week. Samantha Lozano states that she has gained 3 pounds (weight 195 lbs). She states that she has had too many celebrations. We were unable to weigh the patient today for this TeleHealth visit. She feels as if she has gained weight since her last visit. She has lost 42 lbs since starting treatment with [name].  Diabetes II Samantha Lozano has a diagnosis of diabetes type II. She is taking metformin. Samantha Lozano states fasting BGs range between 100 and 120 and she denies any hypoglycemic episodes. Last A1c was at 6.1 She has been working on intensive lifestyle modifications including diet, exercise, and weight loss to help control her blood glucose levels.  Hyperlipidemia Samantha Lozano has hyperlipidemia and she is taking Mevacor. She has been trying to improve her cholesterol levels with intensive lifestyle modification including a low saturated fat diet, exercise and weight loss. She denies myalgias.  ASSESSMENT AND PLAN:  Type 2 diabetes mellitus without complication, without long-term current use of insulin (HCC)  Other hyperlipidemia  Class 2 severe obesity with serious comorbidity and body mass index (BMI) of 35.0 to 35.9 in adult, unspecified obesity type ([place])  PLAN:   Diabetes II Samantha Lozano has been given extensive diabetes education by myself today including ideal fasting and post-prandial blood glucose readings, individual ideal Hgb A1c goals  and hypoglycemia prevention. We discussed the importance of good blood sugar control to decrease the likelihood of diabetic complications such as nephropathy, neuropathy, limb loss, blindness, coronary artery disease, and death. We discussed the importance of intensive lifestyle modification including diet, exercise and weight loss as the first line treatment for diabetes. Samantha Lozano will continue metformin and follow up at the agreed upon time.  Hyperlipidemia Samantha Lozano was informed of the American Heart Association Guidelines emphasizing intensive lifestyle modifications as the first line treatment for hyperlipidemia. We discussed many lifestyle modifications today in depth, and Samantha Lozano will continue to work on decreasing saturated fats such as fatty red meat, butter and many fried foods. She will also increase vegetables and lean protein in her diet and continue to work on exercise and weight loss efforts. She will continue medications and follow up as directed.  Obesity Samantha Lozano is currently in the action stage of change. As such, her goal is to continue with weight loss efforts She has agreed to follow the Category 1 plan Samantha Lozano will continue exercise for weight loss and overall health benefits. We discussed the following Behavioral Modification Strategies today: planning for success, increase H2O intake, no skipping meals, keeping healthy foods in the home, better snacking choices, increasing lean protein intake, decreasing simple carbohydrates, increasing vegetables, decrease eating out, work on meal planning and easy cooking plans, emotional eating strategies, ways to avoid boredom eating, ways to avoid night time snacking and  avoiding temptations Samantha Lozano will weigh herself at home. We discussed "eating out" while on vacation.   Samantha Lozano has agreed to follow up with our clinic in 3 weeks. She was informed of the importance of frequent follow up visits to maximize her success with intensive lifestyle modifications for her multiple health conditions.  ALLERGIES: Allergies  Allergen Reactions  . Lisinopril Cough    MEDICATIONS: Current Outpatient Medications on File Prior to Visit  Medication Sig Dispense Refill  . aspirin EC 81 MG tablet Take 81 mg by mouth daily.    . carvedilol (COREG) 25 MG tablet Take 25 mg by mouth 2 (two) times daily with a meal.    . glucose blood test strip Check BS twice daily 100 each 0  . hydrochlorothiazide (HYDRODIURIL) 25 MG tablet Take 25 mg by mouth daily.    [name] levothyroxine (SYNTHROID, LEVOTHROID) 125 MCG tablet Take 125 mcg by mouth daily before breakfast.    . lovastatin (MEVACOR) 40 MG tablet Take 40 mg by mouth at bedtime.    . metFORMIN (GLUCOPHAGE-XR) 750 MG 24 hr tablet Take 1 tablet (750 mg total) by mouth daily with breakfast. (Patient taking differently: Take 750 mg by mouth daily with breakfast. Take 1 tablet twice a day.) 30 tablet 0  . Omega-3 Fatty Acids (FISH OIL) 435 MG CAPS Take 1 capsule by mouth daily at 2 PM.     No current facility-administered medications on file prior to visit.     PAST MEDICAL HISTORY: Past Medical History:  Diagnosis Date  . Diabetes mellitus, type 2 ([place])   . HLD (hyperlipidemia)   . HTN (hypertension)   . Leg edema   . Other specified disorders of thyroid     PAST SURGICAL HISTORY: Past Surgical History:  Procedure Laterality Date  . ABDOMINAL HYSTERECTOMY    . THYROID SURGERY      SOCIAL HISTORY: Social History   Tobacco Use  . Smoking status: Never Smoker  . Smokeless tobacco: Never Used  Substance Use Topics  . Alcohol use: Not Currently  . Drug use: Not Currently    FAMILY HISTORY: Family History  Problem Relation Age of Onset  . Diabetes Mother   . Hypertension Mother   . Cancer Mother   . Obesity Mother    . Hypertension Father   . Cancer Father   . Obesity Father     ROS: Review of Systems  Constitutional: Negative for weight loss.  Musculoskeletal: Negative for myalgias.  Endo/Heme/Allergies:       Negative for hypoglycemia    PHYSICAL EXAM: Pt in no acute distress  RECENT LABS AND TESTS: BMET    Component Value Date/Time   NA 141 11/27/2018 1027   K 4.7 11/27/2018 1027   CL 100 11/27/2018 1027   CO2 25 11/27/2018 1027   GLUCOSE 120 (H) 11/27/2018 1027   GLUCOSE 126 (H) 06/13/2018 0902   BUN 19 11/27/2018 1027   CREATININE 0.88 11/27/2018 1027   CALCIUM 10.1 11/27/2018 1027   GFRNONAA 67 11/27/2018 1027   GFRAA 77 11/27/2018 1027   Lab Results  Component Value Date   HGBA1C 6.1 (H) 11/27/2018   HGBA1C 6.3 06/13/2018   HGBA1C 7.4 (H) 03/10/2018   Lab Results  Component Value Date   INSULIN 9.1 11/27/2018   INSULIN 15.0 03/10/2018   CBC    Component Value Date/Time   WBC 6.5 03/10/2018 0839   WBC 10.3 09/24/2007 0515   RBC 4.47 03/10/2018 0839   RBC 3.89  09/24/2007 0515   HGB 12.1 03/10/2018 0839   HCT 37.6 03/10/2018 0839   PLT 208 09/24/2007 0515   MCV 84 03/10/2018 0839   MCH 27.1 03/10/2018 0839   MCHC 32.2 03/10/2018 0839   MCHC 34.6 09/24/2007 0515   RDW 14.7 03/10/2018 0839   LYMPHSABS 2.0 03/10/2018 0839   EOSABS 0.3 03/10/2018 0839   BASOSABS 0.0 03/10/2018 0839   Iron/TIBC/Ferritin/ %Sat No results found for: IRON, TIBC, FERRITIN, IRONPCTSAT Lipid Panel     Component Value Date/Time   CHOL 149 03/10/2018 0839   TRIG 125 03/10/2018 0839   HDL 42 03/10/2018 0839   LDLCALC 82 03/10/2018 0839   Hepatic Function Panel     Component Value Date/Time   PROT 7.5 11/27/2018 1027   ALBUMIN 4.6 11/27/2018 1027   AST 15 11/27/2018 1027   ALT 11 11/27/2018 1027   ALKPHOS 79 11/27/2018 1027   BILITOT 0.6 11/27/2018 1027      Component Value Date/Time   TSH 1.300 03/10/2018 0839     Ref. Range 11/27/2018 10:27  Vitamin D, 25-Hydroxy  Latest Ref Range: 30.0 - 100.0 ng/mL 45.7    I, Doreene Nest, am acting as Location manager for [employer]. Owens Shark, DO  I have reviewed the above documentation for accuracy and completeness, and I agree with the above. -Jearld Lesch, DO

## 2019-03-05 ENCOUNTER — Encounter: Payer: Self-pay | Admitting: Internal Medicine

## 2019-03-05 ENCOUNTER — Telehealth (INDEPENDENT_AMBULATORY_CARE_PROVIDER_SITE_OTHER): Payer: PPO | Admitting: Bariatrics

## 2019-03-05 DIAGNOSIS — E1159 Type 2 diabetes mellitus with other circulatory complications: Secondary | ICD-10-CM | POA: Diagnosis not present

## 2019-03-05 DIAGNOSIS — E782 Mixed hyperlipidemia: Secondary | ICD-10-CM | POA: Diagnosis not present

## 2019-03-05 DIAGNOSIS — E039 Hypothyroidism, unspecified: Secondary | ICD-10-CM | POA: Diagnosis not present

## 2019-03-05 DIAGNOSIS — I10 Essential (primary) hypertension: Secondary | ICD-10-CM | POA: Diagnosis not present

## 2019-03-11 ENCOUNTER — Other Ambulatory Visit: Payer: Self-pay

## 2019-03-11 ENCOUNTER — Ambulatory Visit (INDEPENDENT_AMBULATORY_CARE_PROVIDER_SITE_OTHER): Payer: PPO | Admitting: Bariatrics

## 2019-03-11 ENCOUNTER — Encounter (INDEPENDENT_AMBULATORY_CARE_PROVIDER_SITE_OTHER): Payer: Self-pay | Admitting: Bariatrics

## 2019-03-11 VITALS — BP 141/81 | HR 60 | Temp 98.3°F | Ht 62.0 in | Wt 195.0 lb

## 2019-03-11 DIAGNOSIS — E559 Vitamin D deficiency, unspecified: Secondary | ICD-10-CM

## 2019-03-11 DIAGNOSIS — Z9189 Other specified personal risk factors, not elsewhere classified: Secondary | ICD-10-CM

## 2019-03-11 DIAGNOSIS — E119 Type 2 diabetes mellitus without complications: Secondary | ICD-10-CM

## 2019-03-11 DIAGNOSIS — Z6835 Body mass index (BMI) 35.0-35.9, adult: Secondary | ICD-10-CM

## 2019-03-11 MED ORDER — VITAMIN D (ERGOCALCIFEROL) 1.25 MG (50000 UNIT) PO CAPS: 50000.0000 [IU] | ORAL_CAPSULE | ORAL | 0 refills | Status: DC

## 2019-03-12 NOTE — Progress Notes (Signed)
Office: [phone]  /  Fax: [phone]   HPI:   Chief Complaint: OBESITY Samantha Lozano is here to discuss her progress with her obesity treatment plan. She is on the Category 1 plan and is following her eating plan approximately 50 % of the time. She states she is walking 3 miles a day 7 times per week. Samantha Lozano is up 2 pounds. Her weight is 195 lb (88.5 kg) today and has had a weight loss of 2 pounds since her last in-office visit. She has lost 42 lbs since starting treatment with [name].  Vitamin D deficiency Cereniti has a diagnosis of vitamin D deficiency. Samantha Lozano is currently taking OTC vit D and she denies nausea, vomiting or muscle weakness.  Diabetes II Samantha Lozano has a diagnosis of diabetes type II. She is taking Glucophage. Samantha Lozano states fasting BGs range between 110 and 115 and 2 hour post prandial BGs are120. Samantha Lozano denies any hypoglycemic episodes. Last A1c was at 6.1 She has been working on intensive lifestyle modifications including diet, exercise, and weight loss to help control her blood glucose levels.  ASSESSMENT AND PLAN:  Vitamin D deficiency - Plan: Vitamin D, Ergocalciferol, (DRISDOL) 1.25 MG (50000 UT) CAPS capsule  Type 2 diabetes mellitus without complication, without long-term current use of insulin (HCC)  At risk for osteoporosis  Class 2 severe obesity with serious comorbidity and body mass index (BMI) of 35.0 to 35.9 in adult, unspecified obesity type ([place])  PLAN:  Vitamin D Deficiency Shabana was informed that low vitamin D levels contributes to fatigue and are associated with obesity, breast, and colon cancer. She agrees to take prescription Vit D @50 ,000 IU every 14 days #6 with no refills and will follow up for routine testing of vitamin D, at least 2-3 times per year. She was informed of the risk of over-replacement of vitamin D and agrees to not increase her dose unless she discusses this with [name] first. Samantha Lozano agrees to follow up as directed.  4  Diabetes  II [name] has been given extensive diabetes education by myself today including ideal fasting and post-prandial blood glucose readings, individual ideal Hgb A1c goals and hypoglycemia prevention. We discussed the importance of good blood sugar control to decrease the likelihood of diabetic complications such as nephropathy, neuropathy, limb loss, blindness, coronary artery disease, and death. We discussed the importance of intensive lifestyle modification including diet, exercise and weight loss as the first line treatment for diabetes. [name] agrees to continue her diabetes medications and will follow up at the agreed upon time.  Obesity Samantha Lozano is currently in the action stage of change. As such, her goal is to continue with weight loss efforts She has agreed to follow the Category 1 plan Samantha Lozano will continue exercise (walking in the morning and evening) for weight loss and overall health benefits. We discussed the following Behavioral Modification Strategies today: planning for success, increase H2O intake, no skipping meals, keeping healthy foods in the home, increasing lean protein intake, decreasing simple carbohydrates, increasing vegetables, decrease eating out and work on meal planning and easy cooking plans  Samantha Lozano has agreed to follow up with our clinic in 2 weeks. She was informed of the importance of frequent follow up visits to maximize her success with intensive lifestyle modifications for her multiple health conditions.  ALLERGIES: Allergies  Allergen Reactions   Lisinopril Cough    MEDICATIONS: Current Outpatient Medications on File Prior to Visit  Medication Sig Dispense Refill   aspirin EC 81 MG tablet Take 81  mg by mouth daily.     carvedilol (COREG) 25 MG tablet Take 25 mg by mouth 2 (two) times daily with a meal.     glucose blood test strip Check BS twice daily 100 each 0   hydrochlorothiazide (HYDRODIURIL) 25 MG tablet Take 25 mg by mouth daily.     levothyroxine  (SYNTHROID, LEVOTHROID) 125 MCG tablet Take 125 mcg by mouth daily before breakfast.     lovastatin (MEVACOR) 40 MG tablet Take 40 mg by mouth at bedtime.     metFORMIN (GLUCOPHAGE-XR) 750 MG 24 hr tablet Take 1 tablet (750 mg total) by mouth daily with breakfast. (Patient taking differently: Take 750 mg by mouth daily with breakfast. Take 1 tablet twice a day.) 30 tablet 0   Omega-3 Fatty Acids (FISH OIL) 435 MG CAPS Take 1 capsule by mouth daily at 2 PM.     No current facility-administered medications on file prior to visit.     PAST MEDICAL HISTORY: Past Medical History:  Diagnosis Date   Diabetes mellitus, type 2 (HCC)    HLD (hyperlipidemia)    HTN (hypertension)    Leg edema    Other specified disorders of thyroid     PAST SURGICAL HISTORY: Past Surgical History:  Procedure Laterality Date   ABDOMINAL HYSTERECTOMY     THYROID SURGERY      SOCIAL HISTORY: Social History   Tobacco Use   Smoking status: Never Smoker   Smokeless tobacco: Never Used  Substance Use Topics   Alcohol use: Not Currently   Drug use: Not Currently    FAMILY HISTORY: Family History  Problem Relation Age of Onset   Diabetes Mother    Hypertension Mother    Cancer Mother    Obesity Mother    Hypertension Father    Cancer Father    Obesity Father     ROS: Review of Systems  Constitutional: Negative for weight loss.  Gastrointestinal: Negative for nausea and vomiting.  Musculoskeletal:       Negative for muscle weakness  Endo/Heme/Allergies:       Negative for hypoglycemia    PHYSICAL EXAM: Blood pressure (!) 141/81, pulse 60, temperature 98.3 F (36.8 C), temperature source Oral, height 5\' 2"  (1.575 m), weight 195 lb (88.5 kg), SpO2 99 %. Body mass index is 35.67 kg/m. Physical Exam Vitals signs reviewed.  Constitutional:      Appearance: Normal appearance. She is well-developed. She is obese.  Cardiovascular:     Rate and Rhythm: Normal rate.    Pulmonary:     Effort: Pulmonary effort is normal.  Musculoskeletal: Normal range of motion.  Skin:    General: Skin is warm and dry.  Neurological:     Mental Status: She is alert and oriented to person, place, and time.  Psychiatric:        Mood and Affect: Mood normal.        Behavior: Behavior normal.     RECENT LABS AND TESTS: BMET    Component Value Date/Time   NA 141 11/27/2018 1027   K 4.7 11/27/2018 1027   CL 100 11/27/2018 1027   CO2 25 11/27/2018 1027   GLUCOSE 120 (H) 11/27/2018 1027   GLUCOSE 126 (H) 06/13/2018 0902   BUN 19 11/27/2018 1027   CREATININE 0.88 11/27/2018 1027   CALCIUM 10.1 11/27/2018 1027   GFRNONAA 67 11/27/2018 1027   GFRAA 77 11/27/2018 1027   Lab Results  Component Value Date   HGBA1C 6.1 (H) 11/27/2018  HGBA1C 6.3 06/13/2018   HGBA1C 7.4 (H) 03/10/2018   Lab Results  Component Value Date   INSULIN 9.1 11/27/2018   INSULIN 15.0 03/10/2018   CBC    Component Value Date/Time   WBC 6.5 03/10/2018 0839   WBC 10.3 09/24/2007 0515   RBC 4.47 03/10/2018 0839   RBC 3.89 09/24/2007 0515   HGB 12.1 03/10/2018 0839   HCT 37.6 03/10/2018 0839   PLT 208 09/24/2007 0515   MCV 84 03/10/2018 0839   MCH 27.1 03/10/2018 0839   MCHC 32.2 03/10/2018 0839   MCHC 34.6 09/24/2007 0515   RDW 14.7 03/10/2018 0839   LYMPHSABS 2.0 03/10/2018 0839   EOSABS 0.3 03/10/2018 0839   BASOSABS 0.0 03/10/2018 0839   Iron/TIBC/Ferritin/ %Sat No results found for: IRON, TIBC, FERRITIN, IRONPCTSAT Lipid Panel     Component Value Date/Time   CHOL 149 03/10/2018 0839   TRIG 125 03/10/2018 0839   HDL 42 03/10/2018 0839   LDLCALC 82 03/10/2018 0839   Hepatic Function Panel     Component Value Date/Time   PROT 7.5 11/27/2018 1027   ALBUMIN 4.6 11/27/2018 1027   AST 15 11/27/2018 1027   ALT 11 11/27/2018 1027   ALKPHOS 79 11/27/2018 1027   BILITOT 0.6 11/27/2018 1027      Component Value Date/Time   TSH 1.300 03/10/2018 0839      OBESITY  BEHAVIORAL INTERVENTION VISIT  Today's visit was # 22  Starting weight: 237 lbs Starting date: 03/10/2018 Today's weight : 195 lbs Today's date: 03/11/2019 Total lbs lost to date: 42    03/11/2019  Height 5\' 2"  (1.575 m)  Weight 195 lb (88.5 kg)  BMI (Calculated) 35.66  BLOOD PRESSURE - SYSTOLIC 299  BLOOD PRESSURE - DIASTOLIC 81   Body Fat % 37.1 %    ASK: We discussed the diagnosis of obesity with [name] today and Siomara agreed to give [name] permission to discuss obesity behavioral modification therapy today.  ASSESS: Deshauna has the diagnosis of obesity and her BMI today is 35.66 Amariana is in the action stage of change   ADVISE: Inita was educated on the multiple health risks of obesity as well as the benefit of weight loss to improve her health. She was advised of the need for long term treatment and the importance of lifestyle modifications to improve her current health and to decrease her risk of future health problems.  AGREE: Multiple dietary modification options and treatment options were discussed and  Tyree agreed to follow the recommendations documented in the above note.  ARRANGE: Leba was educated on the importance of frequent visits to treat obesity as outlined per CMS and USPSTF guidelines and agreed to schedule her next follow up appointment today.  Corey Skains, am acting as Location manager for [employer]. Owens Shark, DO I have reviewed the above documentation for accuracy and completeness, and I agree with the above. -Jearld Lesch, DO

## 2019-03-26 ENCOUNTER — Encounter (INDEPENDENT_AMBULATORY_CARE_PROVIDER_SITE_OTHER): Payer: Self-pay | Admitting: Bariatrics

## 2019-03-26 ENCOUNTER — Other Ambulatory Visit: Payer: Self-pay

## 2019-03-26 ENCOUNTER — Ambulatory Visit (INDEPENDENT_AMBULATORY_CARE_PROVIDER_SITE_OTHER): Payer: PPO | Admitting: Bariatrics

## 2019-03-26 VITALS — BP 151/80 | HR 61 | Temp 97.9°F | Ht 62.0 in | Wt 195.0 lb

## 2019-03-26 DIAGNOSIS — Z6835 Body mass index (BMI) 35.0-35.9, adult: Secondary | ICD-10-CM | POA: Diagnosis not present

## 2019-03-26 DIAGNOSIS — I10 Essential (primary) hypertension: Secondary | ICD-10-CM

## 2019-03-26 DIAGNOSIS — E559 Vitamin D deficiency, unspecified: Secondary | ICD-10-CM

## 2019-03-26 DIAGNOSIS — E119 Type 2 diabetes mellitus without complications: Secondary | ICD-10-CM | POA: Diagnosis not present

## 2019-03-31 ENCOUNTER — Encounter (INDEPENDENT_AMBULATORY_CARE_PROVIDER_SITE_OTHER): Payer: Self-pay | Admitting: Bariatrics

## 2019-03-31 NOTE — Progress Notes (Signed)
Office: [phone]  /  Fax: [phone]   HPI:   Chief Complaint: OBESITY Samantha Lozano is here to discuss her progress with her obesity treatment plan. She is on the Category 1 plan and is following her eating plan approximately 55 % of the time. She states she is walking 2 miles 2 times per week. Samantha Lozano has remained the same, but she has done very well overall. She has been stressed and her blood pressure is elevated. Her weight is 195 lb (88.5 kg) today and she has maintained weight since her last visit. She has lost 42 lbs since starting treatment with [name].  Diabetes II Samantha Lozano has a diagnosis of diabetes type II. Samantha Lozano is taking metformin. Her last A1c was at 6.1 and last insulin level was at 9.1 She has been working on intensive lifestyle modifications including diet, exercise, and weight loss to help control her blood glucose levels.  Vitamin D deficiency Samantha Lozano has a diagnosis of vitamin D deficiency. Her last vitamin D level was at 45.7 She is currently taking vit D and denies nausea, vomiting or muscle weakness.  Hypertension Samantha Lozano is a 73 y.o. female with hypertension. Her blood pressure is slightly elevated more than usual. Samantha Lozano denies chest pain or shortness of breath on exertion. She is working weight loss to help control her blood pressure with the goal of decreasing her risk of heart attack and stroke. Berthas blood pressure is currently controlled.  ASSESSMENT AND PLAN:  Type 2 diabetes mellitus without complication, without long-term current use of insulin (HCC)  Essential hypertension  Vitamin D deficiency  Class 2 severe obesity with serious comorbidity and body mass index (BMI) of 35.0 to 35.9 in adult, unspecified obesity type ([name])  PLAN:  Diabetes II [name] has been given extensive diabetes education by myself today including ideal fasting and post-prandial blood glucose readings, individual ideal Hgb A1c goals  and hypoglycemia  prevention. We discussed the importance of good blood sugar control to decrease the likelihood of diabetic complications such as nephropathy, neuropathy, limb loss, blindness, coronary artery disease, and death. We discussed the importance of intensive lifestyle modification including diet, exercise and weight loss as the first line treatment for diabetes. [name] agrees to continue her diabetes medications and will follow up at the agreed upon time.  Vitamin D Deficiency Samantha Lozano was informed that low vitamin D levels contributes to fatigue and are associated with obesity, breast, and colon cancer. She agrees to continue to take prescription Vit D @50 ,000 IU every 14 days #6 with no refills and will follow up for routine testing of vitamin D, at least 2-3 times per year. She was informed of the risk of over-replacement of vitamin D and agrees to not increase her dose unless she discusses this with [name] first. [name] agrees to follow up with our clinic in 2 weeks.  Hypertension We discussed sodium restriction, working on healthy weight loss, and a regular exercise program as the means to achieve improved blood pressure control. Samantha Lozano agreed with this plan and agreed to follow up as directed. We will continue to monitor her blood pressure as well as her progress with the above lifestyle modifications. She will continue her medications as prescribed and will watch for signs of hypotension as she continues her lifestyle modifications.  Obesity Samantha Lozano is currently in the action stage of change. As such, her goal is to continue with weight loss efforts She has agreed to follow the Category 1 plan Samantha Lozano will continue  to walk for weight loss and overall health benefits. We discussed the following Behavioral Modification Strategies today: increase H2O intake, no skipping meals, keeping healthy foods in the home, increasing lean protein intake, decreasing simple carbohydrates, increasing vegetables, decrease eating  out and work on meal planning and intentional eating  Samantha Lozano has agreed to follow up with our clinic in 2 weeks. She was informed of the importance of frequent follow up visits to maximize her success with intensive lifestyle modifications for her multiple health conditions.  ALLERGIES: Allergies  Allergen Reactions  . Lisinopril Cough    MEDICATIONS: Current Outpatient Medications on File Prior to Visit  Medication Sig Dispense Refill  . aspirin EC 81 MG tablet Take 81 mg by mouth daily.    . carvedilol (COREG) 25 MG tablet Take 25 mg by mouth 2 (two) times daily with a meal.    . glucose blood test strip Check BS twice daily 100 each 0  . hydrochlorothiazide (HYDRODIURIL) 25 MG tablet Take 25 mg by mouth daily.    [name] levothyroxine (SYNTHROID, LEVOTHROID) 125 MCG tablet Take 125 mcg by mouth daily before breakfast.    . lovastatin (MEVACOR) 40 MG tablet Take 40 mg by mouth at bedtime.    . metFORMIN (GLUCOPHAGE-XR) 750 MG 24 hr tablet Take 1 tablet (750 mg total) by mouth daily with breakfast. (Patient taking differently: Take 750 mg by mouth daily with breakfast. Take 1 tablet twice a day.) 30 tablet 0  . Omega-3 Fatty Acids (FISH OIL) 435 MG CAPS Take 1 capsule by mouth daily at 2 PM.    . Vitamin D, Ergocalciferol, (DRISDOL) 1.25 MG (50000 UT) CAPS capsule Take 1 capsule (50,000 Units total) by mouth every 14 (fourteen) days. 6 capsule 0   No current facility-administered medications on file prior to visit.     PAST MEDICAL HISTORY: Past Medical History:  Diagnosis Date  . Diabetes mellitus, type 2 ([place])   . HLD (hyperlipidemia)   . HTN (hypertension)   . Leg edema   . Other specified disorders of thyroid     PAST SURGICAL HISTORY: Past Surgical History:  Procedure Laterality Date  . ABDOMINAL HYSTERECTOMY    . THYROID SURGERY      SOCIAL HISTORY: Social History   Tobacco Use  . Smoking status: Never Smoker  . Smokeless tobacco: Never Used  Substance Use Topics  .  Alcohol use: Not Currently  . Drug use: Not Currently    FAMILY HISTORY: Family History  Problem Relation Age of Onset  . Diabetes Mother   . Hypertension Mother   . Cancer Mother   . Obesity Mother   . Hypertension Father   . Cancer Father   . Obesity Father     ROS: Review of Systems  Constitutional: Negative for weight loss.  Respiratory: Negative for shortness of breath (on exertion).   Cardiovascular: Negative for chest pain.  Gastrointestinal: Negative for nausea and vomiting.  Musculoskeletal:       Negative for muscle weakness    PHYSICAL EXAM: Blood pressure (!) 151/80, pulse 61, temperature 97.9 F (36.6 C), temperature source Oral, height 5\' 2"  (1.575 m), weight 195 lb (88.5 kg), SpO2 99 %. Body mass index is 35.67 kg/m. Physical Exam Vitals signs reviewed.  Constitutional:      Appearance: Normal appearance. She is well-developed. She is obese.  Cardiovascular:     Rate and Rhythm: Normal rate.  Pulmonary:     Effort: Pulmonary effort is normal.  Musculoskeletal: Normal  range of motion.  Skin:    General: Skin is warm and dry.  Neurological:     Mental Status: She is alert and oriented to person, place, and time.  Psychiatric:        Mood and Affect: Mood normal.        Behavior: Behavior normal.     RECENT LABS AND TESTS: BMET    Component Value Date/Time   NA 141 11/27/2018 1027   K 4.7 11/27/2018 1027   CL 100 11/27/2018 1027   CO2 25 11/27/2018 1027   GLUCOSE 120 (H) 11/27/2018 1027   GLUCOSE 126 (H) 06/13/2018 0902   BUN 19 11/27/2018 1027   CREATININE 0.88 11/27/2018 1027   CALCIUM 10.1 11/27/2018 1027   GFRNONAA 67 11/27/2018 1027   GFRAA 77 11/27/2018 1027   Lab Results  Component Value Date   HGBA1C 6.1 (H) 11/27/2018   HGBA1C 6.3 06/13/2018   HGBA1C 7.4 (H) 03/10/2018   Lab Results  Component Value Date   INSULIN 9.1 11/27/2018   INSULIN 15.0 03/10/2018   CBC    Component Value Date/Time   WBC 6.5 03/10/2018 0839    WBC 10.3 09/24/2007 0515   RBC 4.47 03/10/2018 0839   RBC 3.89 09/24/2007 0515   HGB 12.1 03/10/2018 0839   HCT 37.6 03/10/2018 0839   PLT 208 09/24/2007 0515   MCV 84 03/10/2018 0839   MCH 27.1 03/10/2018 0839   MCHC 32.2 03/10/2018 0839   MCHC 34.6 09/24/2007 0515   RDW 14.7 03/10/2018 0839   LYMPHSABS 2.0 03/10/2018 0839   EOSABS 0.3 03/10/2018 0839   BASOSABS 0.0 03/10/2018 0839   Iron/TIBC/Ferritin/ %Sat No results found for: IRON, TIBC, FERRITIN, IRONPCTSAT Lipid Panel     Component Value Date/Time   CHOL 149 03/10/2018 0839   TRIG 125 03/10/2018 0839   HDL 42 03/10/2018 0839   LDLCALC 82 03/10/2018 0839   Hepatic Function Panel     Component Value Date/Time   PROT 7.5 11/27/2018 1027   ALBUMIN 4.6 11/27/2018 1027   AST 15 11/27/2018 1027   ALT 11 11/27/2018 1027   ALKPHOS 79 11/27/2018 1027   BILITOT 0.6 11/27/2018 1027      Component Value Date/Time   TSH 1.300 03/10/2018 0839     Ref. Range 11/27/2018 10:27  Vitamin D, 25-Hydroxy Latest Ref Range: 30.0 - 100.0 ng/mL 45.7    OBESITY BEHAVIORAL INTERVENTION VISIT  Today's visit was # 23  Starting weight: 237 lbs Starting date: 03/10/2018 Today's weight : 195 lbs Today's date: 03/26/2019 Total lbs lost to date: 42    03/26/2019  Height 5\' 2"  (1.575 m)  Weight 195 lb (88.5 kg)  BMI (Calculated) 35.66  BLOOD PRESSURE - SYSTOLIC 347  BLOOD PRESSURE - DIASTOLIC 80   Body Fat % 42.5 %    ASK: We discussed the diagnosis of obesity with Samantha Lozano today and [name] agreed to give [name] permission to discuss obesity behavioral modification therapy today.  ASSESS: Areanna has the diagnosis of obesity and her BMI today is 35.66 Meagan is in the action stage of change   ADVISE: Bradee was educated on the multiple health risks of obesity as well as the benefit of weight loss to improve her health. She was advised of the need for long term treatment and the importance of lifestyle modifications to improve  her current health and to decrease her risk of future health problems.  AGREE: Multiple dietary modification options and treatment options were  discussed and  Anamarie agreed to follow the recommendations documented in the above note.  ARRANGE: Kaeya was educated on the importance of frequent visits to treat obesity as outlined per CMS and USPSTF guidelines and agreed to schedule her next follow up appointment today.  Corey Skains, am acting as Location manager for [employer]. Owens Shark, DO  I have reviewed the above documentation for accuracy and completeness, and I agree with the above. -Jearld Lesch, DO

## 2019-04-06 DIAGNOSIS — H2512 Age-related nuclear cataract, left eye: Secondary | ICD-10-CM | POA: Diagnosis not present

## 2019-04-06 DIAGNOSIS — H2513 Age-related nuclear cataract, bilateral: Secondary | ICD-10-CM | POA: Diagnosis not present

## 2019-04-09 DIAGNOSIS — Z1211 Encounter for screening for malignant neoplasm of colon: Secondary | ICD-10-CM | POA: Diagnosis not present

## 2019-04-09 DIAGNOSIS — Z538 Procedure and treatment not carried out for other reasons: Secondary | ICD-10-CM | POA: Diagnosis not present

## 2019-04-10 DIAGNOSIS — Z1211 Encounter for screening for malignant neoplasm of colon: Secondary | ICD-10-CM | POA: Diagnosis not present

## 2019-04-15 ENCOUNTER — Encounter (INDEPENDENT_AMBULATORY_CARE_PROVIDER_SITE_OTHER): Payer: Self-pay | Admitting: Bariatrics

## 2019-04-15 ENCOUNTER — Other Ambulatory Visit: Payer: Self-pay

## 2019-04-15 ENCOUNTER — Ambulatory Visit (INDEPENDENT_AMBULATORY_CARE_PROVIDER_SITE_OTHER): Payer: PPO | Admitting: Bariatrics

## 2019-04-15 VITALS — BP 136/80 | HR 60 | Temp 97.9°F | Ht 62.0 in | Wt 200.0 lb

## 2019-04-15 DIAGNOSIS — Z6836 Body mass index (BMI) 36.0-36.9, adult: Secondary | ICD-10-CM | POA: Diagnosis not present

## 2019-04-15 DIAGNOSIS — E119 Type 2 diabetes mellitus without complications: Secondary | ICD-10-CM

## 2019-04-15 DIAGNOSIS — E559 Vitamin D deficiency, unspecified: Secondary | ICD-10-CM

## 2019-04-15 NOTE — Progress Notes (Signed)
Office: [phone]  /  Fax: [phone]   HPI:   Chief Complaint: OBESITY Samantha Lozano is here to discuss her progress with her obesity treatment plan. She is on the Category 1 plan and is following her eating plan approximately 50 % of the time. She states she is walking 3 miles 7 times per week. Samantha Lozano is up 5 lbs (had a colonoscopy and ate more after the procedure). She has two eye surgeries on August 18th and 25th. She is doing well with protein. Her weight is 200 lb (90.7 kg) today and has gained 5 lbs since her last visit. She has lost 37 lbs since starting treatment with [name].  Diabetes II Samantha Lozano has a diagnosis of diabetes type II. Samantha Lozano changed to metformin XR 750 mg. She denies hypoglycemia. Last A1c was 6.1. She has been working on intensive lifestyle modifications including diet, exercise, and weight loss to help control her blood glucose levels.  Vitamin D Deficiency Samantha Lozano has a diagnosis of vitamin D deficiency. She is currently taking prescription Vit D and denies nausea, vomiting or muscle weakness.  ASSESSMENT AND PLAN:  Type 2 diabetes mellitus without complication, without long-term current use of insulin (HCC)  Vitamin D deficiency  Class 2 severe obesity with serious comorbidity and body mass index (BMI) of 36.0 to 36.9 in adult, unspecified obesity type ([place])  PLAN:  Diabetes II Samantha Lozano has been given extensive diabetes education by myself today including ideal fasting and post-prandial blood glucose readings, individual ideal Hgb A1c goals and hypoglycemia prevention. We discussed the importance of good blood sugar control to decrease the likelihood of diabetic complications such as nephropathy, neuropathy, limb loss, blindness, coronary artery disease, and death. We discussed the importance of intensive lifestyle modification including diet, exercise and weight loss as the first line treatment for diabetes. We will check labs at next visit. Samantha Lozano agrees to continue  her diabetes medications, and she agrees to follow up with our clinic in 2 weeks.  Vitamin D Deficiency Samantha Lozano was informed that low vitamin D levels contributes to fatigue and are associated with obesity, breast, and colon cancer. Samantha Lozano agrees to continue taking prescription Vit D 50,000 IU every 14 days and will follow up for routine testing of vitamin D, at least 2-3 times per year. She was informed of the risk of over-replacement of vitamin D and agrees to not increase her dose unless she discusses this with [name] first. Samantha Lozano agrees to follow up with our clinic in 2 weeks.  Obesity Samantha Lozano is currently in the action stage of change. As such, her goal is to continue with weight loss efforts She has agreed to follow the Category 1 plan Samantha Lozano has been instructed to work up to a goal of 150 minutes of combined cardio and strengthening exercise per week or continue walking for weight loss and overall health benefits. We discussed the following Behavioral Modification Strategies today: increasing lean protein intake, decreasing simple carbohydrates, increasing vegetables, increase H20 intake, decrease eating out, work on meal planning and easy cooking plans, no skipping meals, and keeping healthy foods in the home   Samantha Lozano has agreed to follow up with our clinic in 2 weeks. She was informed of the importance of frequent follow up visits to maximize her success with intensive lifestyle modifications for her multiple health conditions.  ALLERGIES: Allergies  Allergen Reactions   Lisinopril Cough    MEDICATIONS: Current Outpatient Medications on File Prior to Visit  Medication Sig Dispense Refill   aspirin EC  81 MG tablet Take 81 mg by mouth daily.     carvedilol (COREG) 25 MG tablet Take 25 mg by mouth 2 (two) times daily with a meal.     glucose blood test strip Check BS twice daily 100 each 0   hydrochlorothiazide (HYDRODIURIL) 25 MG tablet Take 25 mg by mouth daily.     levothyroxine  (SYNTHROID, LEVOTHROID) 125 MCG tablet Take 125 mcg by mouth daily before breakfast.     lovastatin (MEVACOR) 40 MG tablet Take 40 mg by mouth at bedtime.     Omega-3 Fatty Acids (FISH OIL) 435 MG CAPS Take 1 capsule by mouth daily at 2 PM.     Vitamin D, Ergocalciferol, (DRISDOL) 1.25 MG (50000 UT) CAPS capsule Take 1 capsule (50,000 Units total) by mouth every 14 (fourteen) days. 6 capsule 0   No current facility-administered medications on file prior to visit.     PAST MEDICAL HISTORY: Past Medical History:  Diagnosis Date   Diabetes mellitus, type 2 (HCC)    HLD (hyperlipidemia)    HTN (hypertension)    Leg edema    Other specified disorders of thyroid     PAST SURGICAL HISTORY: Past Surgical History:  Procedure Laterality Date   ABDOMINAL HYSTERECTOMY     THYROID SURGERY      SOCIAL HISTORY: Social History   Tobacco Use   Smoking status: Never Smoker   Smokeless tobacco: Never Used  Substance Use Topics   Alcohol use: Not Currently   Drug use: Not Currently    FAMILY HISTORY: Family History  Problem Relation Age of Onset   Diabetes Mother    Hypertension Mother    Cancer Mother    Obesity Mother    Hypertension Father    Cancer Father    Obesity Father     ROS: Review of Systems  Constitutional: Negative for weight loss.  Gastrointestinal: Negative for nausea and vomiting.  Musculoskeletal:       Negative muscle weakness  Endo/Heme/Allergies:       Negative hypoglycemia    PHYSICAL EXAM: Blood pressure 136/80, pulse 60, temperature 97.9 F (36.6 C), temperature source Oral, height 5\' 2"  (1.575 m), weight 200 lb (90.7 kg), SpO2 99 %. Body mass index is 36.58 kg/m. Physical Exam Vitals signs reviewed.  Constitutional:      Appearance: Normal appearance. She is obese.  Cardiovascular:     Rate and Rhythm: Normal rate.     Pulses: Normal pulses.  Pulmonary:     Effort: Pulmonary effort is normal.     Breath sounds: Normal  breath sounds.  Musculoskeletal: Normal range of motion.  Skin:    General: Skin is warm and dry.  Neurological:     Mental Status: She is alert and oriented to person, place, and time.  Psychiatric:        Mood and Affect: Mood normal.        Behavior: Behavior normal.     RECENT LABS AND TESTS: BMET    Component Value Date/Time   NA 141 11/27/2018 1027   K 4.7 11/27/2018 1027   CL 100 11/27/2018 1027   CO2 25 11/27/2018 1027   GLUCOSE 120 (H) 11/27/2018 1027   GLUCOSE 126 (H) 06/13/2018 0902   BUN 19 11/27/2018 1027   CREATININE 0.88 11/27/2018 1027   CALCIUM 10.1 11/27/2018 1027   GFRNONAA 67 11/27/2018 1027   GFRAA 77 11/27/2018 1027   Lab Results  Component Value Date   HGBA1C 6.1 (H)  11/27/2018   HGBA1C 6.3 06/13/2018   HGBA1C 7.4 (H) 03/10/2018   Lab Results  Component Value Date   INSULIN 9.1 11/27/2018   INSULIN 15.0 03/10/2018   CBC    Component Value Date/Time   WBC 6.5 03/10/2018 0839   WBC 10.3 09/24/2007 0515   RBC 4.47 03/10/2018 0839   RBC 3.89 09/24/2007 0515   HGB 12.1 03/10/2018 0839   HCT 37.6 03/10/2018 0839   PLT 208 09/24/2007 0515   MCV 84 03/10/2018 0839   MCH 27.1 03/10/2018 0839   MCHC 32.2 03/10/2018 0839   MCHC 34.6 09/24/2007 0515   RDW 14.7 03/10/2018 0839   LYMPHSABS 2.0 03/10/2018 0839   EOSABS 0.3 03/10/2018 0839   BASOSABS 0.0 03/10/2018 0839   Iron/TIBC/Ferritin/ %Sat No results found for: IRON, TIBC, FERRITIN, IRONPCTSAT Lipid Panel     Component Value Date/Time   CHOL 149 03/10/2018 0839   TRIG 125 03/10/2018 0839   HDL 42 03/10/2018 0839   LDLCALC 82 03/10/2018 0839   Hepatic Function Panel     Component Value Date/Time   PROT 7.5 11/27/2018 1027   ALBUMIN 4.6 11/27/2018 1027   AST 15 11/27/2018 1027   ALT 11 11/27/2018 1027   ALKPHOS 79 11/27/2018 1027   BILITOT 0.6 11/27/2018 1027      Component Value Date/Time   TSH 1.300 03/10/2018 0839      OBESITY BEHAVIORAL INTERVENTION VISIT  Today's  visit was # 24   Starting weight: 237 lbs Starting date: 03/10/18 Today's weight : 200 lbs  Today's date: 04/15/2019 Total lbs lost to date: 37 At least 15 minutes were spent on discussing the following behavioral intervention visit.   ASK: We discussed the diagnosis of obesity with [name] today and Vernecia agreed to give [name] permission to discuss obesity behavioral modification therapy today.  ASSESS: Denay has the diagnosis of obesity and her BMI today is 36.57 Hessie is in the action stage of change   ADVISE: Thresa was educated on the multiple health risks of obesity as well as the benefit of weight loss to improve her health. She was advised of the need for long term treatment and the importance of lifestyle modifications to improve her current health and to decrease her risk of future health problems.  AGREE: Multiple dietary modification options and treatment options were discussed and  Riyana agreed to follow the recommendations documented in the above note.  ARRANGE: Jobeth was educated on the importance of frequent visits to treat obesity as outlined per CMS and USPSTF guidelines and agreed to schedule her next follow up appointment today.  Wilhemena Durie, am acting as transcriptionist for [employer], DO  I have reviewed the above documentation for accuracy and completeness, and I agree with the above. -Jearld Lesch, DO

## 2019-04-28 DIAGNOSIS — H2512 Age-related nuclear cataract, left eye: Secondary | ICD-10-CM | POA: Diagnosis not present

## 2019-04-28 DIAGNOSIS — H25812 Combined forms of age-related cataract, left eye: Secondary | ICD-10-CM | POA: Diagnosis not present

## 2019-04-29 DIAGNOSIS — H25041 Posterior subcapsular polar age-related cataract, right eye: Secondary | ICD-10-CM | POA: Diagnosis not present

## 2019-04-29 DIAGNOSIS — H2511 Age-related nuclear cataract, right eye: Secondary | ICD-10-CM | POA: Diagnosis not present

## 2019-04-29 DIAGNOSIS — H25011 Cortical age-related cataract, right eye: Secondary | ICD-10-CM | POA: Diagnosis not present

## 2019-04-30 ENCOUNTER — Encounter (INDEPENDENT_AMBULATORY_CARE_PROVIDER_SITE_OTHER): Payer: Self-pay | Admitting: Bariatrics

## 2019-04-30 ENCOUNTER — Ambulatory Visit (INDEPENDENT_AMBULATORY_CARE_PROVIDER_SITE_OTHER): Payer: PPO | Admitting: Bariatrics

## 2019-04-30 ENCOUNTER — Other Ambulatory Visit: Payer: Self-pay

## 2019-04-30 VITALS — BP 156/85 | HR 54 | Temp 97.6°F | Ht 62.0 in | Wt 187.0 lb

## 2019-04-30 DIAGNOSIS — E119 Type 2 diabetes mellitus without complications: Secondary | ICD-10-CM | POA: Diagnosis not present

## 2019-04-30 DIAGNOSIS — E559 Vitamin D deficiency, unspecified: Secondary | ICD-10-CM

## 2019-04-30 DIAGNOSIS — Z6834 Body mass index (BMI) 34.0-34.9, adult: Secondary | ICD-10-CM | POA: Diagnosis not present

## 2019-04-30 DIAGNOSIS — E669 Obesity, unspecified: Secondary | ICD-10-CM | POA: Diagnosis not present

## 2019-04-30 MED ORDER — GLUCOSE BLOOD VI STRP: ORAL_STRIP | 0 refills | Status: AC

## 2019-05-05 DIAGNOSIS — H25811 Combined forms of age-related cataract, right eye: Secondary | ICD-10-CM | POA: Diagnosis not present

## 2019-05-05 DIAGNOSIS — H2511 Age-related nuclear cataract, right eye: Secondary | ICD-10-CM | POA: Diagnosis not present

## 2019-05-05 NOTE — Progress Notes (Signed)
Office: [phone]  /  Fax: [phone]   HPI:   Chief Complaint: OBESITY Samantha Lozano is here to discuss her progress with her obesity treatment plan. She is on the Category 1 plan and is following her eating plan approximately 90 % of the time. She states she is walking 3 miles 7 times per week. Samantha Lozano is down 13 pounds since her last visit. She has aggressively increased her walking. Her weight is 187 lb (84.8 kg) today and has had a weight loss of 13 pounds over a period of 2 weeks since her last visit. She has lost 50 lbs since starting treatment with [name].  Diabetes II Samantha Lozano has a diagnosis of diabetes type II. She is not on medications. Samantha Lozano states fasting BGs range in the 140's and she is well controlled. Samantha Lozano denies any hypoglycemic episodes. Last A1c was at 6.1 and last insulin level was at 9.1 She has been working on intensive lifestyle modifications including diet, exercise, and weight loss to help control her blood glucose levels.  Vitamin D deficiency Samantha Lozano has a diagnosis of vitamin D deficiency. Her last vitamin D level was at 45.7 She is currently taking vit D and denies nausea, vomiting or muscle weakness.  ASSESSMENT AND PLAN:  Type 2 diabetes mellitus without complication, without long-term current use of insulin (HCC) - Plan: glucose blood test strip  Vitamin D deficiency  Class 1 obesity with serious comorbidity and body mass index (BMI) of 34.0 to 34.9 in adult, unspecified obesity type  PLAN:  Diabetes II Samantha Lozano has been given extensive diabetes education by myself today including ideal fasting and post-prandial blood glucose readings, individual ideal Hgb A1c goals and hypoglycemia prevention. We discussed the importance of good blood sugar control to decrease the likelihood of diabetic complications such as nephropathy, neuropathy, limb loss, blindness, coronary artery disease, and death. We discussed the importance of intensive lifestyle modification  including diet, exercise and weight loss as the first line treatment for diabetes. Samantha Lozano will check her blood sugar twice daily and prescription was written today for glucose blood test strips #100 with no refills. Samantha Lozano agrees to follow up with our clinic in 2 weeks fasting.  Vitamin D Deficiency Samantha Lozano was informed that low vitamin D levels contributes to fatigue and are associated with obesity, breast, and colon cancer. She will continue to take prescription Vit D @50 ,000 IU every 2 weeks and will follow up for routine testing of vitamin D, at least 2-3 times per year. She was informed of the risk of over-replacement of vitamin D and agrees to not increase her dose unless she discusses this with [name] first.  Obesity Samantha Lozano is currently in the action stage of change. As such, her goal is to continue with weight loss efforts She has agreed to follow the Category 1 plan Samantha Lozano will continue her exercise regimen for weight loss and overall health benefits. We discussed the following Behavioral Modification Strategies today: increase H2O intake, no skipping meals, keeping healthy foods in the home, increasing lean protein intake, decreasing simple carbohydrates, increasing vegetables, decrease eating out and work on meal planning and intentional eating  Samantha Lozano has agreed to follow up with our clinic in 2 weeks fasting. She was informed of the importance of frequent follow up visits to maximize her success with intensive lifestyle modifications for her multiple health conditions.  ALLERGIES: Allergies  Allergen Reactions   Lisinopril Cough    MEDICATIONS: Current Outpatient Medications on File Prior to Visit  Medication Sig Dispense  Refill   aspirin EC 81 MG tablet Take 81 mg by mouth daily.     carvedilol (COREG) 25 MG tablet Take 25 mg by mouth 2 (two) times daily with a meal.     hydrochlorothiazide (HYDRODIURIL) 25 MG tablet Take 25 mg by mouth daily.     levothyroxine (SYNTHROID,  LEVOTHROID) 125 MCG tablet Take 125 mcg by mouth daily before breakfast.     lovastatin (MEVACOR) 40 MG tablet Take 40 mg by mouth at bedtime.     Omega-3 Fatty Acids (FISH OIL) 435 MG CAPS Take 1 capsule by mouth daily at 2 PM.     Vitamin D, Ergocalciferol, (DRISDOL) 1.25 MG (50000 UT) CAPS capsule Take 1 capsule (50,000 Units total) by mouth every 14 (fourteen) days. 6 capsule 0   metFORMIN (GLUCOPHAGE) 850 MG tablet Take 850 mg by mouth daily. with food     No current facility-administered medications on file prior to visit.     PAST MEDICAL HISTORY: Past Medical History:  Diagnosis Date   Diabetes mellitus, type 2 (HCC)    HLD (hyperlipidemia)    HTN (hypertension)    Leg edema    Other specified disorders of thyroid     PAST SURGICAL HISTORY: Past Surgical History:  Procedure Laterality Date   ABDOMINAL HYSTERECTOMY     THYROID SURGERY      SOCIAL HISTORY: Social History   Tobacco Use   Smoking status: Never Smoker   Smokeless tobacco: Never Used  Substance Use Topics   Alcohol use: Not Currently   Drug use: Not Currently    FAMILY HISTORY: Family History  Problem Relation Age of Onset   Diabetes Mother    Hypertension Mother    Cancer Mother    Obesity Mother    Hypertension Father    Cancer Father    Obesity Father     ROS: Review of Systems  Constitutional: Positive for weight loss.  Gastrointestinal: Negative for nausea and vomiting.  Musculoskeletal:       Negative for muscle weakness    PHYSICAL EXAM: Blood pressure (!) 156/85, pulse (!) 54, temperature 97.6 F (36.4 C), temperature source Oral, height 5\' 2"  (1.575 m), weight 187 lb (84.8 kg), SpO2 97 %. Body mass index is 34.2 kg/m. Physical Exam Vitals signs reviewed.  Constitutional:      Appearance: Normal appearance. She is well-developed. She is obese.  Cardiovascular:     Rate and Rhythm: Normal rate.  Pulmonary:     Effort: Pulmonary effort is normal.    Musculoskeletal: Normal range of motion.  Skin:    General: Skin is warm and dry.  Neurological:     Mental Status: She is alert and oriented to person, place, and time.  Psychiatric:        Mood and Affect: Mood normal.        Behavior: Behavior normal.     RECENT LABS AND TESTS: BMET    Component Value Date/Time   NA 141 11/27/2018 1027   K 4.7 11/27/2018 1027   CL 100 11/27/2018 1027   CO2 25 11/27/2018 1027   GLUCOSE 120 (H) 11/27/2018 1027   GLUCOSE 126 (H) 06/13/2018 0902   BUN 19 11/27/2018 1027   CREATININE 0.88 11/27/2018 1027   CALCIUM 10.1 11/27/2018 1027   GFRNONAA 67 11/27/2018 1027   GFRAA 77 11/27/2018 1027   Lab Results  Component Value Date   HGBA1C 6.1 (H) 11/27/2018   HGBA1C 6.3 06/13/2018   HGBA1C 7.4 (  H) 03/10/2018   Lab Results  Component Value Date   INSULIN 9.1 11/27/2018   INSULIN 15.0 03/10/2018   CBC    Component Value Date/Time   WBC 6.5 03/10/2018 0839   WBC 10.3 09/24/2007 0515   RBC 4.47 03/10/2018 0839   RBC 3.89 09/24/2007 0515   HGB 12.1 03/10/2018 0839   HCT 37.6 03/10/2018 0839   PLT 208 09/24/2007 0515   MCV 84 03/10/2018 0839   MCH 27.1 03/10/2018 0839   MCHC 32.2 03/10/2018 0839   MCHC 34.6 09/24/2007 0515   RDW 14.7 03/10/2018 0839   LYMPHSABS 2.0 03/10/2018 0839   EOSABS 0.3 03/10/2018 0839   BASOSABS 0.0 03/10/2018 0839   Iron/TIBC/Ferritin/ %Sat No results found for: IRON, TIBC, FERRITIN, IRONPCTSAT Lipid Panel     Component Value Date/Time   CHOL 149 03/10/2018 0839   TRIG 125 03/10/2018 0839   HDL 42 03/10/2018 0839   LDLCALC 82 03/10/2018 0839   Hepatic Function Panel     Component Value Date/Time   PROT 7.5 11/27/2018 1027   ALBUMIN 4.6 11/27/2018 1027   AST 15 11/27/2018 1027   ALT 11 11/27/2018 1027   ALKPHOS 79 11/27/2018 1027   BILITOT 0.6 11/27/2018 1027      Component Value Date/Time   TSH 1.300 03/10/2018 0839     Ref. Range 11/27/2018 10:27  Vitamin D, 25-Hydroxy Latest Ref  Range: 30.0 - 100.0 ng/mL 45.7    OBESITY BEHAVIORAL INTERVENTION VISIT  Today's visit was # 25  Starting weight: 237 lbs Starting date: 03/10/2018 Today's weight : 187 lbs Today's date: 04/30/2019 Total lbs lost to date: 50    04/30/2019  Height 5\' 2"  (1.575 m)  Weight 187 lb (84.8 kg)  BMI (Calculated) 34.19  BLOOD PRESSURE - SYSTOLIC 308  BLOOD PRESSURE - DIASTOLIC 85   Body Fat % 50 %    ASK: We discussed the diagnosis of obesity with Samantha Lozano today and Samantha Lozano agreed to give [name] permission to discuss obesity behavioral modification therapy today.  ASSESS: Samantha Lozano has the diagnosis of obesity and her BMI today is 34.19 Samantha Lozano is in the action stage of change   ADVISE: Samantha Lozano was educated on the multiple health risks of obesity as well as the benefit of weight loss to improve her health. She was advised of the need for long term treatment and the importance of lifestyle modifications to improve her current health and to decrease her risk of future health problems.  AGREE: Multiple dietary modification options and treatment options were discussed and  Samantha Lozano agreed to follow the recommendations documented in the above note.  ARRANGE: Samantha Lozano was educated on the importance of frequent visits to treat obesity as outlined per CMS and USPSTF guidelines and agreed to schedule her next follow up appointment today.  Corey Skains, am acting as Location manager for [employer]. Owens Shark, DO  I have reviewed the above documentation for accuracy and completeness, and I agree with the above. -Jearld Lesch, DO

## 2019-05-06 ENCOUNTER — Encounter (INDEPENDENT_AMBULATORY_CARE_PROVIDER_SITE_OTHER): Payer: Self-pay | Admitting: Bariatrics

## 2019-05-14 ENCOUNTER — Ambulatory Visit (INDEPENDENT_AMBULATORY_CARE_PROVIDER_SITE_OTHER): Payer: PPO | Admitting: Family Medicine

## 2019-05-14 ENCOUNTER — Other Ambulatory Visit: Payer: Self-pay

## 2019-05-14 VITALS — BP 130/79 | HR 54 | Temp 97.8°F | Ht 62.0 in | Wt 195.6 lb

## 2019-05-14 DIAGNOSIS — Z6835 Body mass index (BMI) 35.0-35.9, adult: Secondary | ICD-10-CM | POA: Diagnosis not present

## 2019-05-14 DIAGNOSIS — E559 Vitamin D deficiency, unspecified: Secondary | ICD-10-CM

## 2019-05-14 DIAGNOSIS — E119 Type 2 diabetes mellitus without complications: Secondary | ICD-10-CM

## 2019-05-15 LAB — VITAMIN D 25 HYDROXY (VIT D DEFICIENCY, FRACTURES): Vit D, 25-Hydroxy: 47.3 ng/mL (ref 30.0–100.0)

## 2019-05-19 NOTE — Progress Notes (Signed)
Office: [phone]  /  Fax: [phone]   HPI:   Chief Complaint: OBESITY Samantha Lozano is here to discuss her progress with her obesity treatment plan. She is on the Category 1 plan and is following her eating plan approximately 85 % of the time. She states she is walking 60 minutes 7 times per week. Samantha Lozano has been eating too much bread. She loves bread and has a hard time refusing it. She has cut out cookies. Samantha Lozano is buying bread that is not on the plan. Her weight is 195 lb 9.6 oz (88.7 kg) today and has had a weight gain of 8 pounds over a period of 2 weeks since her last visit. She has lost 42 lbs since starting treatment with [name].  Diabetes II  Samantha Lozano has a diagnosis of diabetes type II. She is well controlled. Samantha Lozano states fasting BGs range between 120 and 130 and at night her BGs range between 135 and 140. She is on metformin only. She admits to polyphagia in the mornings. Last A1c was at 6.2 on 03/05/19 at [facility]. She has been working on intensive lifestyle modifications including diet, exercise, and weight loss to help control her blood glucose levels.  Vitamin D deficiency Samantha Lozano has a diagnosis of vitamin D deficiency. Samantha Lozano is currently taking vit D and her last vitamin D level was almost at goal (45.7). She denies nausea, vomiting or muscle weakness.  ASSESSMENT AND PLAN:  Vitamin D deficiency - Plan: VITAMIN D 25 Hydroxy (Vit-D Deficiency, Fractures)  Type 2 diabetes mellitus without complication, without long-term current use of insulin (HCC)  Class 2 severe obesity with serious comorbidity and body mass index (BMI) of 35.0 to 35.9 in adult, unspecified obesity type ([place])  PLAN:  Diabetes II  Samantha Lozano has been given extensive diabetes education by myself today including ideal fasting and post-prandial blood glucose readings, individual ideal Hgb A1c goals and hypoglycemia prevention. We discussed the importance of good blood sugar control to decrease the likelihood of  diabetic complications such as nephropathy, neuropathy, limb loss, blindness, coronary artery disease, and death. We discussed the importance of intensive lifestyle modification including diet, exercise and weight loss as the first line treatment for diabetes. [name] will continue metformin and follow up at the agreed upon time.  Vitamin D Deficiency [name] was informed that low vitamin D levels contributes to fatigue and are associated with obesity, breast, and colon cancer. Samantha Lozano will continue to take prescription Vit D @50 ,000 IU every 14 days and will follow up for routine testing of vitamin D, at least 2-3 times per year. She was informed of the risk of over-replacement of vitamin D and agrees to not increase her dose unless she discusses this with [name] first. We will check vitamin D level today.  Obesity Samantha Lozano is currently in the action stage of change. As such, her goal is to continue with weight loss efforts She has agreed to follow the Category 1 plan Samantha Lozano will continue her exercise regimen for weight loss and overall health benefits. We discussed the following Behavioral Modification Strategies today: planning for success and decreasing simple carbohydrates   WE discussed that she should try to only buy food that is on the plan to reduce temptation.   Samantha Lozano has agreed to follow up with our clinic in 2 weeks. She was informed of the importance of frequent follow up visits to maximize her success with intensive lifestyle modifications for her multiple health conditions.  ALLERGIES: Allergies  Allergen Reactions  Lisinopril Cough    MEDICATIONS: Current Outpatient Medications on File Prior to Visit  Medication Sig Dispense Refill   aspirin EC 81 MG tablet Take 81 mg by mouth daily.     carvedilol (COREG) 25 MG tablet Take 25 mg by mouth 2 (two) times daily with a meal.     glucose blood test strip Check BS twice daily 100 each 0   hydrochlorothiazide (HYDRODIURIL) 25 MG  tablet Take 25 mg by mouth daily.     levothyroxine (SYNTHROID, LEVOTHROID) 125 MCG tablet Take 125 mcg by mouth daily before breakfast.     lovastatin (MEVACOR) 40 MG tablet Take 40 mg by mouth at bedtime.     metFORMIN (GLUCOPHAGE) 850 MG tablet Take 850 mg by mouth daily. with food     Omega-3 Fatty Acids (FISH OIL) 435 MG CAPS Take 1 capsule by mouth daily at 2 PM.     Vitamin D, Ergocalciferol, (DRISDOL) 1.25 MG (50000 UT) CAPS capsule Take 1 capsule (50,000 Units total) by mouth every 14 (fourteen) days. 6 capsule 0   No current facility-administered medications on file prior to visit.     PAST MEDICAL HISTORY: Past Medical History:  Diagnosis Date   Diabetes mellitus, type 2 (HCC)    HLD (hyperlipidemia)    HTN (hypertension)    Leg edema    Other specified disorders of thyroid     PAST SURGICAL HISTORY: Past Surgical History:  Procedure Laterality Date   ABDOMINAL HYSTERECTOMY     THYROID SURGERY      SOCIAL HISTORY: Social History   Tobacco Use   Smoking status: Never Smoker   Smokeless tobacco: Never Used  Substance Use Topics   Alcohol use: Not Currently   Drug use: Not Currently    FAMILY HISTORY: Family History  Problem Relation Age of Onset   Diabetes Mother    Hypertension Mother    Cancer Mother    Obesity Mother    Hypertension Father    Cancer Father    Obesity Father     ROS: Review of Systems  Constitutional: Negative for weight loss.  Gastrointestinal: Negative for nausea and vomiting.  Musculoskeletal:       Negative for muscle weakness  Endo/Heme/Allergies:       Positive for polyphagia    PHYSICAL EXAM: Blood pressure 130/79, pulse (!) 54, temperature 97.8 F (36.6 C), temperature source Oral, height 5\' 2"  (1.575 m), weight 195 lb 9.6 oz (88.7 kg), SpO2 98 %. Body mass index is 35.78 kg/m. Physical Exam Vitals signs reviewed.  Constitutional:      Appearance: Normal appearance. She is well-developed.  She is obese.  Cardiovascular:     Rate and Rhythm: Normal rate.  Pulmonary:     Effort: Pulmonary effort is normal.  Musculoskeletal: Normal range of motion.  Skin:    General: Skin is warm and dry.  Neurological:     Mental Status: She is alert and oriented to person, place, and time.  Psychiatric:        Mood and Affect: Mood normal.        Behavior: Behavior normal.     RECENT LABS AND TESTS: BMET    Component Value Date/Time   NA 141 11/27/2018 1027   K 4.7 11/27/2018 1027   CL 100 11/27/2018 1027   CO2 25 11/27/2018 1027   GLUCOSE 120 (H) 11/27/2018 1027   GLUCOSE 126 (H) 06/13/2018 0902   BUN 19 11/27/2018 1027   CREATININE 0.88 11/27/2018  1027   CALCIUM 10.1 11/27/2018 1027   GFRNONAA 67 11/27/2018 1027   GFRAA 77 11/27/2018 1027   Lab Results  Component Value Date   HGBA1C 6.1 (H) 11/27/2018   HGBA1C 6.3 06/13/2018   HGBA1C 7.4 (H) 03/10/2018   Lab Results  Component Value Date   INSULIN 9.1 11/27/2018   INSULIN 15.0 03/10/2018   CBC    Component Value Date/Time   WBC 6.5 03/10/2018 0839   WBC 10.3 09/24/2007 0515   RBC 4.47 03/10/2018 0839   RBC 3.89 09/24/2007 0515   HGB 12.1 03/10/2018 0839   HCT 37.6 03/10/2018 0839   PLT 208 09/24/2007 0515   MCV 84 03/10/2018 0839   MCH 27.1 03/10/2018 0839   MCHC 32.2 03/10/2018 0839   MCHC 34.6 09/24/2007 0515   RDW 14.7 03/10/2018 0839   LYMPHSABS 2.0 03/10/2018 0839   EOSABS 0.3 03/10/2018 0839   BASOSABS 0.0 03/10/2018 0839   Iron/TIBC/Ferritin/ %Sat No results found for: IRON, TIBC, FERRITIN, IRONPCTSAT Lipid Panel     Component Value Date/Time   CHOL 149 03/10/2018 0839   TRIG 125 03/10/2018 0839   HDL 42 03/10/2018 0839   LDLCALC 82 03/10/2018 0839   Hepatic Function Panel     Component Value Date/Time   PROT 7.5 11/27/2018 1027   ALBUMIN 4.6 11/27/2018 1027   AST 15 11/27/2018 1027   ALT 11 11/27/2018 1027   ALKPHOS 79 11/27/2018 1027   BILITOT 0.6 11/27/2018 1027        Component Value Date/Time   TSH 1.300 03/10/2018 0839     Ref. Range 11/27/2018 10:27  Vitamin D, 25-Hydroxy Latest Ref Range: 30.0 - 100.0 ng/mL 45.7    OBESITY BEHAVIORAL INTERVENTION VISIT  Today's visit was # 26   Starting weight: 237 lbs Starting date: 03/10/2018 Today's weight : 195 lbs Today's date: 05/14/2019 Total lbs lost to date: 42    05/14/2019  Height 5\' 2"  (1.575 m)  Weight 195 lb 9.6 oz (88.7 kg)  BMI (Calculated) 35.77  BLOOD PRESSURE - SYSTOLIC 638  BLOOD PRESSURE - DIASTOLIC 79   Body Fat % 46.6 %    ASK: We discussed the diagnosis of obesity with [name] today and [name] agreed to give [name] permission to discuss obesity behavioral modification therapy today.  ASSESS: Elizeth has the diagnosis of obesity and her BMI today is 35.77 Onica is in the action stage of change   ADVISE: Bennetta was educated on the multiple health risks of obesity as well as the benefit of weight loss to improve her health. She was advised of the need for long term treatment and the importance of lifestyle modifications to improve her current health and to decrease her risk of future health problems.  AGREE: Multiple dietary modification options and treatment options were discussed and  Kynadie agreed to follow the recommendations documented in the above note.  ARRANGE: Angi was educated on the importance of frequent visits to treat obesity as outlined per CMS and USPSTF guidelines and agreed to schedule her next follow up appointment today.  I, Doreene Nest, am acting as transcriptionist for [employer], FNP-C  I have reviewed the above documentation for accuracy and completeness, and I agree with the above.  - Christipher Rieger, FNP-C.

## 2019-05-20 ENCOUNTER — Encounter (INDEPENDENT_AMBULATORY_CARE_PROVIDER_SITE_OTHER): Payer: Self-pay | Admitting: Family Medicine

## 2019-05-26 ENCOUNTER — Encounter: Payer: Self-pay | Admitting: Oncology

## 2019-05-26 DIAGNOSIS — Z1231 Encounter for screening mammogram for malignant neoplasm of breast: Secondary | ICD-10-CM | POA: Diagnosis not present

## 2019-05-28 ENCOUNTER — Other Ambulatory Visit: Payer: Self-pay

## 2019-05-28 ENCOUNTER — Ambulatory Visit (INDEPENDENT_AMBULATORY_CARE_PROVIDER_SITE_OTHER): Payer: PPO | Admitting: Family Medicine

## 2019-05-28 VITALS — BP 117/76 | HR 54 | Temp 97.9°F | Ht 62.0 in | Wt 190.8 lb

## 2019-05-28 DIAGNOSIS — Z6834 Body mass index (BMI) 34.0-34.9, adult: Secondary | ICD-10-CM

## 2019-05-28 DIAGNOSIS — E559 Vitamin D deficiency, unspecified: Secondary | ICD-10-CM | POA: Diagnosis not present

## 2019-05-28 DIAGNOSIS — E119 Type 2 diabetes mellitus without complications: Secondary | ICD-10-CM | POA: Diagnosis not present

## 2019-05-28 DIAGNOSIS — E669 Obesity, unspecified: Secondary | ICD-10-CM | POA: Diagnosis not present

## 2019-05-28 MED ORDER — VITAMIN D (ERGOCALCIFEROL) 1.25 MG (50000 UNIT) PO CAPS: 50000.0000 [IU] | ORAL_CAPSULE | ORAL | 0 refills | Status: DC

## 2019-05-29 ENCOUNTER — Encounter: Payer: Self-pay | Admitting: Oncology

## 2019-05-29 DIAGNOSIS — R921 Mammographic calcification found on diagnostic imaging of breast: Secondary | ICD-10-CM | POA: Diagnosis not present

## 2019-06-02 NOTE — Progress Notes (Signed)
Office: [phone]  /  Fax: [phone]   HPI:   Chief Complaint: OBESITY Samantha Lozano is here to discuss her progress with her obesity treatment plan. She is on the Category 1 plan and is following her eating plan approximately 85 to 90 % of the time. She states she is walking 60 minutes 7 times per week. Samantha Lozano is doing better with reducing carbohydrates. She has cut back on bread which is very hard for her. She has been adding an extra egg at breakfast for hunger after walking. Her weight is 190 lb 12.8 oz (86.5 kg) today and has had a weight loss of 5 pounds over a period of 2 weeks since her last visit. She has lost 47 lbs since starting treatment with [name].  Diabetes II Samantha Lozano has a diagnosis of diabetes type II. DM is well controlled on metformin. Her last A1c was at 6.2 at [place] on 03/05/19. She has been working on intensive lifestyle modifications including diet, exercise, and weight loss to help control her blood glucose levels.  Vitamin D deficiency Samantha Lozano has a diagnosis of vitamin D deficiency. Samantha Lozano is maintained well on 50,000 IU of vit D every 14 days. Samantha Lozano denies nausea, vomiting or muscle weakness.  ASSESSMENT AND PLAN:  Type 2 diabetes mellitus without complication, without long-term current use of insulin (HCC)  Vitamin D deficiency - Plan: Vitamin D, Ergocalciferol, (DRISDOL) 1.25 MG (50000 UT) CAPS capsule  Class 1 obesity with serious comorbidity and body mass index (BMI) of 34.0 to 34.9 in adult, unspecified obesity type  PLAN:  Diabetes II Samantha Lozano has been given extensive diabetes education by myself today including ideal fasting and post-prandial blood glucose readings, individual ideal Hgb A1c goals and hypoglycemia prevention. We discussed the importance of good blood sugar control to decrease the likelihood of diabetic complications such as nephropathy, neuropathy, limb loss, blindness, coronary artery disease, and death. We discussed the importance of  intensive lifestyle modification including diet, exercise and weight loss as the first line treatment for diabetes. Samantha Lozano will continue metformin and follow up at the agreed upon time.  Vitamin D Deficiency Samantha Lozano was informed that low vitamin D levels contributes to fatigue and are associated with obesity, breast, and colon cancer. Samantha Lozano agrees to continue to take prescription Vit D @50 ,000 IU every 14 days #6 with no refills and she will follow up for routine testing of vitamin D, at least 2-3 times per year. She was informed of the risk of over-replacement of vitamin D and agrees to not increase her dose unless she discusses this with [name] first. Samantha Lozano agrees to follow up with our clinic in 2 weeks.  Obesity Samantha Lozano is currently in the action stage of change. As such, her goal is to continue with weight loss efforts She has agreed to follow the Category 1 plan Samantha Lozano will continue current exercise regimen for weight loss and overall health benefits. We discussed the following Behavioral Modification Strategies today: planning for success and decreasing simple carbohydrates  Samantha Lozano can continue to add an egg at breakfast.  Samantha Lozano has agreed to follow up with our clinic in 2 weeks. She was informed of the importance of frequent follow up visits to maximize her success with intensive lifestyle modifications for her multiple health conditions.  ALLERGIES: Allergies  Allergen Reactions  . Lisinopril Cough    MEDICATIONS: Current Outpatient Medications on File Prior to Visit  Medication Sig Dispense Refill  . aspirin EC 81 MG tablet Take 81 mg by mouth daily.    [name]  carvedilol (COREG) 25 MG tablet Take 25 mg by mouth 2 (two) times daily with a meal.    . glucose blood test strip Check BS twice daily 100 each 0  . hydrochlorothiazide (HYDRODIURIL) 25 MG tablet Take 25 mg by mouth daily.    [name] levothyroxine (SYNTHROID, LEVOTHROID) 125 MCG tablet Take 125 mcg by mouth daily before breakfast.    .  lovastatin (MEVACOR) 40 MG tablet Take 40 mg by mouth at bedtime.    . metFORMIN (GLUCOPHAGE) 850 MG tablet Take 850 mg by mouth daily. with food    . Omega-3 Fatty Acids (FISH OIL) 435 MG CAPS Take 1 capsule by mouth daily at 2 PM.     No current facility-administered medications on file prior to visit.     PAST MEDICAL HISTORY: Past Medical History:  Diagnosis Date  . Diabetes mellitus, type 2 ([place])   . HLD (hyperlipidemia)   . HTN (hypertension)   . Leg edema   . Other specified disorders of thyroid     PAST SURGICAL HISTORY: Past Surgical History:  Procedure Laterality Date  . ABDOMINAL HYSTERECTOMY    . THYROID SURGERY      SOCIAL HISTORY: Social History   Tobacco Use  . Smoking status: Never Smoker  . Smokeless tobacco: Never Used  Substance Use Topics  . Alcohol use: Not Currently  . Drug use: Not Currently    FAMILY HISTORY: Family History  Problem Relation Age of Onset  . Diabetes Mother   . Hypertension Mother   . Cancer Mother   . Obesity Mother   . Hypertension Father   . Cancer Father   . Obesity Father     ROS: Review of Systems  Constitutional: Positive for weight loss.  Gastrointestinal: Negative for nausea and vomiting.  Musculoskeletal:       Negative for muscle weakness    PHYSICAL EXAM: Blood pressure 117/76, pulse (!) 54, temperature 97.9 F (36.6 C), temperature source Oral, height 5\' 2"  (1.575 m), weight 190 lb 12.8 oz (86.5 kg), SpO2 98 %. Body mass index is 34.9 kg/m. Physical Exam Vitals signs reviewed.  Constitutional:      Appearance: Normal appearance. She is well-developed. She is obese.  Cardiovascular:     Rate and Rhythm: Normal rate.  Pulmonary:     Effort: Pulmonary effort is normal.  Musculoskeletal: Normal range of motion.  Skin:    General: Skin is warm and dry.  Neurological:     Mental Status: She is alert and oriented to person, place, and time.  Psychiatric:        Mood and Affect: Mood normal.         Behavior: Behavior normal.     RECENT LABS AND TESTS: BMET    Component Value Date/Time   NA 141 11/27/2018 1027   K 4.7 11/27/2018 1027   CL 100 11/27/2018 1027   CO2 25 11/27/2018 1027   GLUCOSE 120 (H) 11/27/2018 1027   GLUCOSE 126 (H) 06/13/2018 0902   BUN 19 11/27/2018 1027   CREATININE 0.88 11/27/2018 1027   CALCIUM 10.1 11/27/2018 1027   GFRNONAA 67 11/27/2018 1027   GFRAA 77 11/27/2018 1027   Lab Results  Component Value Date   HGBA1C 6.1 (H) 11/27/2018   HGBA1C 6.3 06/13/2018   HGBA1C 7.4 (H) 03/10/2018   Lab Results  Component Value Date   INSULIN 9.1 11/27/2018   INSULIN 15.0 03/10/2018   CBC    Component Value Date/Time   WBC  6.5 03/10/2018 0839   WBC 10.3 09/24/2007 0515   RBC 4.47 03/10/2018 0839   RBC 3.89 09/24/2007 0515   HGB 12.1 03/10/2018 0839   HCT 37.6 03/10/2018 0839   PLT 208 09/24/2007 0515   MCV 84 03/10/2018 0839   MCH 27.1 03/10/2018 0839   MCHC 32.2 03/10/2018 0839   MCHC 34.6 09/24/2007 0515   RDW 14.7 03/10/2018 0839   LYMPHSABS 2.0 03/10/2018 0839   EOSABS 0.3 03/10/2018 0839   BASOSABS 0.0 03/10/2018 0839   Iron/TIBC/Ferritin/ %Sat No results found for: IRON, TIBC, FERRITIN, IRONPCTSAT Lipid Panel     Component Value Date/Time   CHOL 149 03/10/2018 0839   TRIG 125 03/10/2018 0839   HDL 42 03/10/2018 0839   LDLCALC 82 03/10/2018 0839   Hepatic Function Panel     Component Value Date/Time   PROT 7.5 11/27/2018 1027   ALBUMIN 4.6 11/27/2018 1027   AST 15 11/27/2018 1027   ALT 11 11/27/2018 1027   ALKPHOS 79 11/27/2018 1027   BILITOT 0.6 11/27/2018 1027      Component Value Date/Time   TSH 1.300 03/10/2018 0839      OBESITY BEHAVIORAL INTERVENTION VISIT  Today's visit was # 27  Starting weight: 237 lbs Starting date: 03/10/2018 Today's weight : 190 lbs Today's date: 05/28/2019 Total lbs lost to date: 47    05/28/2019  Height 5\' 2"  (1.575 m)  Weight 190 lb 12.8 oz (86.5 kg)  BMI (Calculated) 34.89   BLOOD PRESSURE - SYSTOLIC 037  BLOOD PRESSURE - DIASTOLIC 76   Body Fat % 04.8 %    ASK: We discussed the diagnosis of obesity with [name] today and Kaelie agreed to give [name] permission to discuss obesity behavioral modification therapy today.  ASSESS: Brinn has the diagnosis of obesity and her BMI today is 34.89 Anaily is in the action stage of change   ADVISE: Ellaina was educated on the multiple health risks of obesity as well as the benefit of weight loss to improve her health. She was advised of the need for long term treatment and the importance of lifestyle modifications to improve her current health and to decrease her risk of future health problems.  AGREE: Multiple dietary modification options and treatment options were discussed and  Beza agreed to follow the recommendations documented in the above note.  ARRANGE: Alyss was educated on the importance of frequent visits to treat obesity as outlined per CMS and USPSTF guidelines and agreed to schedule her next follow up appointment today.  I, Doreene Nest, am acting as transcriptionist for [employer], FNP-C  I have reviewed the above documentation for accuracy and completeness, and I agree with the above.  - Kjuan Seipp, FNP-C.

## 2019-06-04 ENCOUNTER — Encounter (INDEPENDENT_AMBULATORY_CARE_PROVIDER_SITE_OTHER): Payer: Self-pay | Admitting: Family Medicine

## 2019-06-15 ENCOUNTER — Ambulatory Visit (INDEPENDENT_AMBULATORY_CARE_PROVIDER_SITE_OTHER): Payer: PPO | Admitting: Family Medicine

## 2019-06-15 ENCOUNTER — Other Ambulatory Visit: Payer: Self-pay

## 2019-06-15 VITALS — BP 117/75 | HR 59 | Temp 97.9°F | Ht 62.0 in | Wt 196.0 lb

## 2019-06-15 DIAGNOSIS — Z6835 Body mass index (BMI) 35.0-35.9, adult: Secondary | ICD-10-CM

## 2019-06-15 DIAGNOSIS — E119 Type 2 diabetes mellitus without complications: Secondary | ICD-10-CM

## 2019-06-16 NOTE — Progress Notes (Signed)
Office: [phone]  /  Fax: [phone]   HPI:   Chief Complaint: OBESITY Samantha Lozano is here to discuss her progress with her obesity treatment plan. She is on the Category 1 plan and is following her eating plan approximately 80 % of the time. She states she is walking 60 minutes 7 times per week. Samantha Lozano has not been walking as much, due to right knee pain. She is very disappointed with her weight gain. She has stuck to the plan well. Samantha Lozano is eating all of the protein on the plan. Sometimes she has an extra apple. She denies shortness of breath and orthopnea. Her weight is 196 lb (88.9 kg) today and has had a weight gain of 6 pounds over a period of 2 weeks since her last visit. She has gain 1 lb since starting treatment with [name].  Diabetes II Samantha Lozano has a diagnosis of diabetes type II. She is well controlled on Metformin. Samantha Lozano states fasting BGs run between 120's and 130's and 2 hour post prandial BGs run in the 140's . She admits to polyphagia at times and she denies any hypoglycemic episodes. Last A1c was at 6.2 on 03/05/19 She has been working on intensive lifestyle modifications including diet, exercise, and weight loss to help control her blood glucose levels.  ASSESSMENT AND PLAN:  Type 2 diabetes mellitus without complication, without long-term current use of insulin (HCC)  Class 2 severe obesity with serious comorbidity and body mass index (BMI) of 35.0 to 35.9 in adult, unspecified obesity type ([place])  PLAN:  Diabetes II Samantha Lozano has been given extensive diabetes education by myself today including ideal fasting and post-prandial blood glucose readings, individual ideal Hgb A1c goals and hypoglycemia prevention. We discussed the importance of good blood sugar control to decrease the likelihood of diabetic complications such as nephropathy, neuropathy, limb loss, blindness, coronary artery disease, and death. We discussed the importance of intensive lifestyle modification including  diet, exercise and weight loss as the first line treatment for diabetes. Samantha Lozano agrees to continue metformin and follow up at the agreed upon time.  Obesity Samantha Lozano is currently in the action stage of change. As such, her goal is to continue with weight loss efforts She has agreed to follow the Category 1 plan Samantha Lozano will continue to walking 60 minutes, 7 times per week for weight loss and overall health benefits. We discussed the following Behavioral Modification Strategies today: planning for success and better snacking choices Encouragement was provided to patient today.  Samantha Lozano has agreed to follow up with our clinic in 2 weeks. She was informed of the importance of frequent follow up visits to maximize her success with intensive lifestyle modifications for her multiple health conditions.  ALLERGIES: Allergies  Allergen Reactions  . Lisinopril Cough    MEDICATIONS: Current Outpatient Medications on File Prior to Visit  Medication Sig Dispense Refill  . aspirin EC 81 MG tablet Take 81 mg by mouth daily.    . carvedilol (COREG) 25 MG tablet Take 25 mg by mouth 2 (two) times daily with a meal.    . glucose blood test strip Check BS twice daily 100 each 0  . hydrochlorothiazide (HYDRODIURIL) 25 MG tablet Take 25 mg by mouth daily.    [name] levothyroxine (SYNTHROID, LEVOTHROID) 125 MCG tablet Take 125 mcg by mouth daily before breakfast.    . lovastatin (MEVACOR) 40 MG tablet Take 40 mg by mouth at bedtime.    . metFORMIN (GLUCOPHAGE) 850 MG tablet Take 850 mg by  mouth daily. with food    . Omega-3 Fatty Acids (FISH OIL) 435 MG CAPS Take 1 capsule by mouth daily at 2 PM.    . Vitamin D, Ergocalciferol, (DRISDOL) 1.25 MG (50000 UT) CAPS capsule Take 1 capsule (50,000 Units total) by mouth every 14 (fourteen) days. 6 capsule 0   No current facility-administered medications on file prior to visit.     PAST MEDICAL HISTORY: Past Medical History:  Diagnosis Date  . Diabetes mellitus, type 2  ([place])   . HLD (hyperlipidemia)   . HTN (hypertension)   . Leg edema   . Other specified disorders of thyroid     PAST SURGICAL HISTORY: Past Surgical History:  Procedure Laterality Date  . ABDOMINAL HYSTERECTOMY    . THYROID SURGERY      SOCIAL HISTORY: Social History   Tobacco Use  . Smoking status: Never Smoker  . Smokeless tobacco: Never Used  Substance Use Topics  . Alcohol use: Not Currently  . Drug use: Not Currently    FAMILY HISTORY: Family History  Problem Relation Age of Onset  . Diabetes Mother   . Hypertension Mother   . Cancer Mother   . Obesity Mother   . Hypertension Father   . Cancer Father   . Obesity Father     ROS: Review of Systems  Constitutional: Negative for weight loss.  Respiratory: Negative for shortness of breath.   Cardiovascular: Negative for orthopnea.  Endo/Heme/Allergies:       Positive for polyphagia Negative for hypoglycemia    PHYSICAL EXAM: Blood pressure 117/75, pulse (!) 59, temperature 97.9 F (36.6 C), temperature source Oral, height 5\' 2"  (1.575 m), weight 196 lb (88.9 kg), SpO2 97 %. Body mass index is 35.85 kg/m. Physical Exam Vitals signs reviewed.  Constitutional:      Appearance: Normal appearance. She is well-developed. She is obese.  Cardiovascular:     Rate and Rhythm: Normal rate.  Pulmonary:     Effort: Pulmonary effort is normal.  Musculoskeletal: Normal range of motion.  Skin:    General: Skin is warm and dry.  Neurological:     Mental Status: She is alert and oriented to person, place, and time.  Psychiatric:        Mood and Affect: Mood normal.        Behavior: Behavior normal.     RECENT LABS AND TESTS: BMET    Component Value Date/Time   NA 141 11/27/2018 1027   K 4.7 11/27/2018 1027   CL 100 11/27/2018 1027   CO2 25 11/27/2018 1027   GLUCOSE 120 (H) 11/27/2018 1027   GLUCOSE 126 (H) 06/13/2018 0902   BUN 19 11/27/2018 1027   CREATININE 0.88 11/27/2018 1027   CALCIUM 10.1  11/27/2018 1027   GFRNONAA 67 11/27/2018 1027   GFRAA 77 11/27/2018 1027   Lab Results  Component Value Date   HGBA1C 6.1 (H) 11/27/2018   HGBA1C 6.3 06/13/2018   HGBA1C 7.4 (H) 03/10/2018   Lab Results  Component Value Date   INSULIN 9.1 11/27/2018   INSULIN 15.0 03/10/2018   CBC    Component Value Date/Time   WBC 6.5 03/10/2018 0839   WBC 10.3 09/24/2007 0515   RBC 4.47 03/10/2018 0839   RBC 3.89 09/24/2007 0515   HGB 12.1 03/10/2018 0839   HCT 37.6 03/10/2018 0839   PLT 208 09/24/2007 0515   MCV 84 03/10/2018 0839   MCH 27.1 03/10/2018 0839   MCHC 32.2 03/10/2018 0839  MCHC 34.6 09/24/2007 0515   RDW 14.7 03/10/2018 0839   LYMPHSABS 2.0 03/10/2018 0839   EOSABS 0.3 03/10/2018 0839   BASOSABS 0.0 03/10/2018 0839   Iron/TIBC/Ferritin/ %Sat No results found for: IRON, TIBC, FERRITIN, IRONPCTSAT Lipid Panel     Component Value Date/Time   CHOL 149 03/10/2018 0839   TRIG 125 03/10/2018 0839   HDL 42 03/10/2018 0839   LDLCALC 82 03/10/2018 0839   Hepatic Function Panel     Component Value Date/Time   PROT 7.5 11/27/2018 1027   ALBUMIN 4.6 11/27/2018 1027   AST 15 11/27/2018 1027   ALT 11 11/27/2018 1027   ALKPHOS 79 11/27/2018 1027   BILITOT 0.6 11/27/2018 1027      Component Value Date/Time   TSH 1.300 03/10/2018 0839     Ref. Range 05/14/2019 09:50  Vitamin D, 25-Hydroxy Latest Ref Range: 30.0 - 100.0 ng/mL 47.3    OBESITY BEHAVIORAL INTERVENTION VISIT  Today's visit was # 28  Starting weight: 195 lbs Starting date: 03/10/2018 Today's weight : 196 lbs  Today's date: 06/15/2019 Total lbs lost to date: 0    06/15/2019  Height 5\' 2"  (1.575 m)  Weight 196 lb (88.9 kg)  BMI (Calculated) 35.84  BLOOD PRESSURE - SYSTOLIC 373  BLOOD PRESSURE - DIASTOLIC 75   Body Fat % 52 %    ASK: We discussed the diagnosis of obesity with [name] today and [name] agreed to give [name] permission to discuss obesity behavioral modification therapy today.   ASSESS: Jyllian has the diagnosis of obesity and her BMI today is 35.84 Mahoganie is in the action stage of change   ADVISE: Shayna was educated on the multiple health risks of obesity as well as the benefit of weight loss to improve her health. She was advised of the need for long term treatment and the importance of lifestyle modifications to improve her current health and to decrease her risk of future health problems.  AGREE: Multiple dietary modification options and treatment options were discussed and  Negin agreed to follow the recommendations documented in the above note.  ARRANGE: Madysen was educated on the importance of frequent visits to treat obesity as outlined per CMS and USPSTF guidelines and agreed to schedule her next follow up appointment today.  I, Doreene Nest, am acting as transcriptionist for [employer], FNP-C  I have reviewed the above documentation for accuracy and completeness, and I agree with the above.  - Zealand Boyett, FNP-C.

## 2019-06-17 ENCOUNTER — Encounter (INDEPENDENT_AMBULATORY_CARE_PROVIDER_SITE_OTHER): Payer: Self-pay | Admitting: Family Medicine

## 2019-06-24 DIAGNOSIS — L720 Epidermal cyst: Secondary | ICD-10-CM | POA: Diagnosis not present

## 2019-06-24 DIAGNOSIS — D225 Melanocytic nevi of trunk: Secondary | ICD-10-CM | POA: Diagnosis not present

## 2019-06-24 DIAGNOSIS — D2272 Melanocytic nevi of left lower limb, including hip: Secondary | ICD-10-CM | POA: Diagnosis not present

## 2019-06-24 DIAGNOSIS — L570 Actinic keratosis: Secondary | ICD-10-CM | POA: Diagnosis not present

## 2019-06-24 DIAGNOSIS — Z85828 Personal history of other malignant neoplasm of skin: Secondary | ICD-10-CM | POA: Diagnosis not present

## 2019-06-24 DIAGNOSIS — D2262 Melanocytic nevi of left upper limb, including shoulder: Secondary | ICD-10-CM | POA: Diagnosis not present

## 2019-06-24 DIAGNOSIS — C44629 Squamous cell carcinoma of skin of left upper limb, including shoulder: Secondary | ICD-10-CM | POA: Diagnosis not present

## 2019-06-24 DIAGNOSIS — D2271 Melanocytic nevi of right lower limb, including hip: Secondary | ICD-10-CM | POA: Diagnosis not present

## 2019-06-24 DIAGNOSIS — D1801 Hemangioma of skin and subcutaneous tissue: Secondary | ICD-10-CM | POA: Diagnosis not present

## 2019-06-24 DIAGNOSIS — D2371 Other benign neoplasm of skin of right lower limb, including hip: Secondary | ICD-10-CM | POA: Diagnosis not present

## 2019-06-30 ENCOUNTER — Ambulatory Visit (INDEPENDENT_AMBULATORY_CARE_PROVIDER_SITE_OTHER): Payer: PPO | Admitting: Family Medicine

## 2019-07-02 ENCOUNTER — Other Ambulatory Visit: Payer: Self-pay

## 2019-07-02 ENCOUNTER — Encounter (INDEPENDENT_AMBULATORY_CARE_PROVIDER_SITE_OTHER): Payer: Self-pay | Admitting: Family Medicine

## 2019-07-02 ENCOUNTER — Ambulatory Visit (INDEPENDENT_AMBULATORY_CARE_PROVIDER_SITE_OTHER): Payer: PPO | Admitting: Family Medicine

## 2019-07-02 VITALS — BP 153/77 | HR 59 | Temp 97.9°F | Ht 62.0 in | Wt 200.0 lb

## 2019-07-02 DIAGNOSIS — F3289 Other specified depressive episodes: Secondary | ICD-10-CM | POA: Diagnosis not present

## 2019-07-02 DIAGNOSIS — Z6836 Body mass index (BMI) 36.0-36.9, adult: Secondary | ICD-10-CM

## 2019-07-05 NOTE — Progress Notes (Signed)
Office: [phone]  /  Fax: [phone]   HPI:   Chief Complaint: OBESITY Samantha Lozano is here to discuss her progress with her obesity treatment plan. She is on the  follow the Category 1 plan and is following her eating plan approximately 55 % of the time. She states she is exercising by walking for 60 minutes 7 times per week. Emonnie has been very stressed due to recent diagnosis of cancer in 2 family members and a skin cancer on herself. She has been totally off plan the last week. She has been eating a lot of mayo. However, she has not gone overboard on bread which is her usual pattern. Her weight is 200 lb (90.7 kg) today and has had a weight gain of 4 pounds over a period of 2 weeks since her last visit. She has lost 0 lbs since starting treatment with [name].  Depression with emotional eating behaviors Missi is struggling with emotional eating and using food for comfort to the extent that it is negatively impacting her health. She often snacks when she is not hungry. Tennie sometimes feels she is out of control and then feels guilty that she made poor food choices. She has been working on behavior modification techniques to help reduce her emotional eating and has been somewhat successful. She admits to stress eating due to recent deaths in family and cancer found on her arm.   She shows no sign of suicidal or homicidal ideations.  Depression screen PHQ 2/9 03/10/2018  Decreased Interest 0  Down, Depressed, Hopeless 1  PHQ - 2 Score 1  Altered sleeping 0  Tired, decreased energy 1  Change in appetite 0  Feeling bad or failure about yourself  1  Trouble concentrating 0  Moving slowly or fidgety/restless 0  Suicidal thoughts 0  PHQ-9 Score 3  Difficult doing work/chores Not difficult at all     ASSESSMENT AND PLAN:  Other depression - with emotional eating  Class 2 severe obesity with serious comorbidity and body mass index (BMI) of 36.0 to 36.9 in adult, unspecified obesity type  ([place])  PLAN: Depression with Emotional Eating Behaviors We discussed behavior modification techniques today to help Christiann deal with her emotional eating and depression. We discussed her seeing Dr. Mallie Mussel (psychologist) and she declined and agreed to follow up as directed.  Obesity Marcianne is currently in the action stage of change. As such, her goal is to continue with weight loss efforts She has agreed to follow the Category 1 plan Jayma will continue current exercise regimen for weight loss and overall health benefits. We discussed the following Behavioral Modification Strategies today: increasing lean protein intake, increasing water intake, and planning for success.  We discussed decreasing mayo to 1 serving per day.   Blanch has agreed to follow up with our clinic in 3 weeks. She was informed of the importance of frequent follow up visits to maximize her success with intensive lifestyle modifications for her multiple health conditions.  ALLERGIES: Allergies  Allergen Reactions  . Lisinopril Cough    MEDICATIONS: Current Outpatient Medications on File Prior to Visit  Medication Sig Dispense Refill  . aspirin EC 81 MG tablet Take 81 mg by mouth daily.    . carvedilol (COREG) 25 MG tablet Take 25 mg by mouth 2 (two) times daily with a meal.    . glucose blood test strip Check BS twice daily 100 each 0  . hydrochlorothiazide (HYDRODIURIL) 25 MG tablet Take 25 mg by mouth daily.    [name]  levothyroxine (SYNTHROID, LEVOTHROID) 125 MCG tablet Take 125 mcg by mouth daily before breakfast.    . lovastatin (MEVACOR) 40 MG tablet Take 40 mg by mouth at bedtime.    . metFORMIN (GLUCOPHAGE) 850 MG tablet Take 850 mg by mouth daily. with food    . Omega-3 Fatty Acids (FISH OIL) 435 MG CAPS Take 1 capsule by mouth daily at 2 PM.    . Vitamin D, Ergocalciferol, (DRISDOL) 1.25 MG (50000 UT) CAPS capsule Take 1 capsule (50,000 Units total) by mouth every 14 (fourteen) days. 6 capsule 0   No current  facility-administered medications on file prior to visit.     PAST MEDICAL HISTORY: Past Medical History:  Diagnosis Date  . Diabetes mellitus, type 2 ([place])   . HLD (hyperlipidemia)   . HTN (hypertension)   . Leg edema   . Other specified disorders of thyroid     PAST SURGICAL HISTORY: Past Surgical History:  Procedure Laterality Date  . ABDOMINAL HYSTERECTOMY    . THYROID SURGERY      SOCIAL HISTORY: Social History   Tobacco Use  . Smoking status: Never Smoker  . Smokeless tobacco: Never Used  Substance Use Topics  . Alcohol use: Not Currently  . Drug use: Not Currently    FAMILY HISTORY: Family History  Problem Relation Age of Onset  . Diabetes Mother   . Hypertension Mother   . Cancer Mother   . Obesity Mother   . Hypertension Father   . Cancer Father   . Obesity Father     ROS: Review of Systems  Constitutional: Negative for weight loss.  Psychiatric/Behavioral: Positive for depression. Negative for suicidal ideas.    PHYSICAL EXAM: Blood pressure (!) 153/77, pulse (!) 59, temperature 97.9 F (36.6 C), temperature source Oral, height 5\' 2"  (1.575 m), weight 200 lb (90.7 kg), SpO2 99 %. Body mass index is 36.58 kg/m. Physical Exam Vitals signs reviewed.  Constitutional:      Appearance: Normal appearance. She is obese.  HENT:     Head: Normocephalic.     Nose: Nose normal.  Neck:     Musculoskeletal: Normal range of motion.  Cardiovascular:     Rate and Rhythm: Normal rate.  Pulmonary:     Effort: Pulmonary effort is normal.  Musculoskeletal: Normal range of motion.  Skin:    General: Skin is warm and dry.  Neurological:     Mental Status: She is alert and oriented to person, place, and time.  Psychiatric:        Mood and Affect: Mood normal.        Behavior: Behavior normal.     RECENT LABS AND TESTS: BMET    Component Value Date/Time   NA 141 11/27/2018 1027   K 4.7 11/27/2018 1027   CL 100 11/27/2018 1027   CO2 25 11/27/2018  1027   GLUCOSE 120 (H) 11/27/2018 1027   GLUCOSE 126 (H) 06/13/2018 0902   BUN 19 11/27/2018 1027   CREATININE 0.88 11/27/2018 1027   CALCIUM 10.1 11/27/2018 1027   GFRNONAA 67 11/27/2018 1027   GFRAA 77 11/27/2018 1027   Lab Results  Component Value Date   HGBA1C 6.1 (H) 11/27/2018   HGBA1C 6.3 06/13/2018   HGBA1C 7.4 (H) 03/10/2018   Lab Results  Component Value Date   INSULIN 9.1 11/27/2018   INSULIN 15.0 03/10/2018   CBC    Component Value Date/Time   WBC 6.5 03/10/2018 0839   WBC 10.3 09/24/2007 0515  RBC 4.47 03/10/2018 0839   RBC 3.89 09/24/2007 0515   HGB 12.1 03/10/2018 0839   HCT 37.6 03/10/2018 0839   PLT 208 09/24/2007 0515   MCV 84 03/10/2018 0839   MCH 27.1 03/10/2018 0839   MCHC 32.2 03/10/2018 0839   MCHC 34.6 09/24/2007 0515   RDW 14.7 03/10/2018 0839   LYMPHSABS 2.0 03/10/2018 0839   EOSABS 0.3 03/10/2018 0839   BASOSABS 0.0 03/10/2018 0839   Iron/TIBC/Ferritin/ %Sat No results found for: IRON, TIBC, FERRITIN, IRONPCTSAT Lipid Panel     Component Value Date/Time   CHOL 149 03/10/2018 0839   TRIG 125 03/10/2018 0839   HDL 42 03/10/2018 0839   LDLCALC 82 03/10/2018 0839   Hepatic Function Panel     Component Value Date/Time   PROT 7.5 11/27/2018 1027   ALBUMIN 4.6 11/27/2018 1027   AST 15 11/27/2018 1027   ALT 11 11/27/2018 1027   ALKPHOS 79 11/27/2018 1027   BILITOT 0.6 11/27/2018 1027      Component Value Date/Time   TSH 1.300 03/10/2018 0839      OBESITY BEHAVIORAL INTERVENTION VISIT  Today's visit was # 29  Starting weight: 195 lbs Starting date: 03/10/18 Today's weight : Weight: 200 lb (90.7 kg)  Today's date: 07/02/19 Total lbs lost to date: 0 At least 15 minutes were spent on discussing the following behavioral intervention visit.   ASK: We discussed the diagnosis of obesity with [name] today and Kyliyah agreed to give [name] permission to discuss obesity behavioral modification therapy today.  ASSESS: Bitania  has the diagnosis of obesity and her BMI today is 36.57 Lashanti is in the action stage of change   ADVISE: Shalyn was educated on the multiple health risks of obesity as well as the benefit of weight loss to improve her health. She was advised of the need for long term treatment and the importance of lifestyle modifications to improve her current health and to decrease her risk of future health problems.  AGREE: Multiple dietary modification options and treatment options were discussed and  Dewey agreed to follow the recommendations documented in the above note.  ARRANGE: Andelyn was educated on the importance of frequent visits to treat obesity as outlined per CMS and USPSTF guidelines and agreed to schedule her next follow up appointment today.  I, Renee Ramus, am acting as Location manager for [employer], [place].  I have reviewed the above documentation for accuracy and completeness, and I agree with the above.  - Melva Faux, FNP-C.

## 2019-07-06 ENCOUNTER — Encounter (INDEPENDENT_AMBULATORY_CARE_PROVIDER_SITE_OTHER): Payer: Self-pay | Admitting: Family Medicine

## 2019-07-06 DIAGNOSIS — F329 Major depressive disorder, single episode, unspecified: Secondary | ICD-10-CM | POA: Insufficient documentation

## 2019-07-06 DIAGNOSIS — F32A Depression, unspecified: Secondary | ICD-10-CM | POA: Insufficient documentation

## 2019-07-23 ENCOUNTER — Ambulatory Visit (INDEPENDENT_AMBULATORY_CARE_PROVIDER_SITE_OTHER): Payer: PPO | Admitting: Family Medicine

## 2019-07-27 ENCOUNTER — Ambulatory Visit (INDEPENDENT_AMBULATORY_CARE_PROVIDER_SITE_OTHER): Payer: PPO | Admitting: Family Medicine

## 2019-07-27 ENCOUNTER — Other Ambulatory Visit: Payer: Self-pay

## 2019-07-27 VITALS — BP 166/81 | HR 61 | Temp 97.9°F | Ht 62.0 in | Wt 194.0 lb

## 2019-07-27 DIAGNOSIS — R7303 Prediabetes: Secondary | ICD-10-CM

## 2019-07-27 DIAGNOSIS — E038 Other specified hypothyroidism: Secondary | ICD-10-CM

## 2019-07-27 DIAGNOSIS — Z6835 Body mass index (BMI) 35.0-35.9, adult: Secondary | ICD-10-CM

## 2019-07-27 DIAGNOSIS — E559 Vitamin D deficiency, unspecified: Secondary | ICD-10-CM

## 2019-07-27 DIAGNOSIS — F3289 Other specified depressive episodes: Secondary | ICD-10-CM | POA: Diagnosis not present

## 2019-07-28 NOTE — Progress Notes (Signed)
Office: [phone]  /  Fax: [phone]   HPI:   Chief Complaint: OBESITY Samantha Lozano is here to discuss her progress with her obesity treatment plan. She is on the Category 1 plan and is following her eating plan approximately 85 % of the time. She states she is walking for 3 miles 7 times per week. Samantha Lozano is doing well back on the plan, and she is walking regular.  Her weight is 194 lb (88 kg) today and has had a weight loss of 6 pounds over a period of 3 to 4 weeks since her last visit. She has lost 43 lbs since starting treatment with [name].  Vitamin D Deficiency Samantha Lozano has a diagnosis of vitamin D deficiency. She is currently taking prescription Vit D. Last Vit D level was 47.3 on 05/14/2019. She denies nausea, vomiting or muscle weakness.  Pre-Diabetes Samantha Lozano has a diagnosis of pre-diabetes based on her elevated Hgb A1c and was informed this puts her at greater risk of developing diabetes. Last A1c was 6.1 on 11/27/2018. She is taking metformin currently and continues to work on diet and exercise to decrease risk of diabetes. She denies nausea or hypoglycemia.  Hypothyroidism Samantha Lozano has a diagnosis of hypothyroidism. She is currently on levothyroxine. Last TSH level was 1.3 on 03/10/18. She denies hot or cold intolerance or palpitations.  Depression with Emotional Eating Behaviors Samantha Lozano notes her emotional eating has improved and she is adhering to her diet. She declines seeing a therapist. Samantha Lozano struggles with emotional eating and using food for comfort to the extent that it is negatively impacting her health. She often snacks when she is not hungry. Samantha Lozano sometimes feels she is out of control and then feels guilty that she made poor food choices. She has been working on behavior modification techniques to help reduce her emotional eating and has been somewhat successful. She shows no sign of suicidal or homicidal ideations.  Depression screen PHQ 2/9 03/10/2018  Decreased Interest 0  Down,  Depressed, Hopeless 1  PHQ - 2 Score 1  Altered sleeping 0  Tired, decreased energy 1  Change in appetite 0  Feeling bad or failure about yourself  1  Trouble concentrating 0  Moving slowly or fidgety/restless 0  Suicidal thoughts 0  PHQ-9 Score 3  Difficult doing work/chores Not difficult at all    ASSESSMENT AND PLAN:  Vitamin D deficiency  Prediabetes  Other specified hypothyroidism  Other depression - with emotional eating  Class 2 severe obesity with serious comorbidity and body mass index (BMI) of 35.0 to 35.9 in adult, unspecified obesity type ([place])  PLAN:  Vitamin D Deficiency Yoshie was informed that low vitamin D levels contributes to fatigue and are associated with obesity, breast, and colon cancer. Dashea agrees to continue taking prescription Vit D 50,000 IU every 14 days, no refill needed today and will follow up for routine testing of vitamin D, at least 2-3 times per year. She was informed of the risk of over-replacement of vitamin D and agrees to not increase her dose unless she discusses this with [name] first. Ambur agrees to follow up with our clinic in 2 to 3 weeks.  Pre-Diabetes Samantha Lozano will continue to work on weight loss, exercise, and decreasing simple carbohydrates in her diet to help decrease the risk of diabetes. We dicussed metformin including benefits and risks. She was informed that eating too many simple carbohydrates or too many calories at one sitting increases the likelihood of GI side effects. Samantha Lozano agrees to  continue taking metformin, and she agrees to follow up with our clinic in 2 to 3 weeks as directed to monitor her progress.  Hypothyroidism Samantha Lozano was informed of the importance of good thyroid control to help with weight loss efforts. She was also informed that supertheraputic thyroid levels are dangerous and will not improve weight loss results. Samantha Lozano agrees to follow up with our clinic in 2 to 3 weeks as directed to monitor her  progress.  Depression with Emotional Eating Behaviors We discussed behavior modification techniques today to help Samantha Lozano deal with her emotional eating and depression.   Obesity Samantha Lozano is currently in the action stage of change. As such, her goal is to continue with weight loss efforts She has agreed to follow the Category 1 plan Samantha Lozano has been instructed to work up to a goal of 150 minutes of combined cardio and strengthening exercise per week for weight loss and overall health benefits. We discussed the following Behavioral Modification Strategies today: increasing lean protein intake, decreasing simple carbohydrates, increasing vegetables, increase H20 intake, work on meal planning and easy cooking plans and holiday eating strategies   Samantha Lozano has agreed to follow up with our clinic in 2 to 3 weeks. She was informed of the importance of frequent follow up visits to maximize her success with intensive lifestyle modifications for her multiple health conditions.  ALLERGIES: Allergies  Allergen Reactions  . Lisinopril Cough    MEDICATIONS: Current Outpatient Medications on File Prior to Visit  Medication Sig Dispense Refill  . aspirin EC 81 MG tablet Take 81 mg by mouth daily.    . carvedilol (COREG) 25 MG tablet Take 25 mg by mouth 2 (two) times daily with a meal.    . glucose blood test strip Check BS twice daily 100 each 0  . hydrochlorothiazide (HYDRODIURIL) 25 MG tablet Take 25 mg by mouth daily.    [name] levothyroxine (SYNTHROID, LEVOTHROID) 125 MCG tablet Take 125 mcg by mouth daily before breakfast.    . lovastatin (MEVACOR) 40 MG tablet Take 40 mg by mouth at bedtime.    . metFORMIN (GLUCOPHAGE) 850 MG tablet Take 850 mg by mouth daily. with food    . Omega-3 Fatty Acids (FISH OIL) 435 MG CAPS Take 1 capsule by mouth daily at 2 PM.    . Vitamin D, Ergocalciferol, (DRISDOL) 1.25 MG (50000 UT) CAPS capsule Take 1 capsule (50,000 Units total) by mouth every 14 (fourteen) days. 6  capsule 0   No current facility-administered medications on file prior to visit.     PAST MEDICAL HISTORY: Past Medical History:  Diagnosis Date  . Diabetes mellitus, type 2 ([place])   . HLD (hyperlipidemia)   . HTN (hypertension)   . Leg edema   . Other specified disorders of thyroid     PAST SURGICAL HISTORY: Past Surgical History:  Procedure Laterality Date  . ABDOMINAL HYSTERECTOMY    . THYROID SURGERY      SOCIAL HISTORY: Social History   Tobacco Use  . Smoking status: Never Smoker  . Smokeless tobacco: Never Used  Substance Use Topics  . Alcohol use: Not Currently  . Drug use: Not Currently    FAMILY HISTORY: Family History  Problem Relation Age of Onset  . Diabetes Mother   . Hypertension Mother   . Cancer Mother   . Obesity Mother   . Hypertension Father   . Cancer Father   . Obesity Father     ROS: Review of Systems  Constitutional: Positive  for weight loss.  Cardiovascular: Negative for palpitations.  Gastrointestinal: Negative for nausea and vomiting.  Musculoskeletal:       Negative muscle weakness  Endo/Heme/Allergies:       Negative hypoglycemia Negative hot/cold intolerance  Psychiatric/Behavioral: Positive for depression. Negative for suicidal ideas.    PHYSICAL EXAM: Blood pressure (!) 166/81, pulse 61, temperature 97.9 F (36.6 C), temperature source Oral, height 5\' 2"  (1.575 m), weight 194 lb (88 kg), SpO2 98 %. Body mass index is 35.48 kg/m. Physical Exam Vitals signs reviewed.  Constitutional:      Appearance: Normal appearance. She is obese.  Cardiovascular:     Rate and Rhythm: Normal rate.     Pulses: Normal pulses.  Pulmonary:     Effort: Pulmonary effort is normal.     Breath sounds: Normal breath sounds.  Musculoskeletal: Normal range of motion.  Skin:    General: Skin is warm and dry.  Neurological:     Mental Status: She is alert and oriented to person, place, and time.  Psychiatric:        Mood and Affect: Mood  normal.        Behavior: Behavior normal.     RECENT LABS AND TESTS: BMET    Component Value Date/Time   NA 141 11/27/2018 1027   K 4.7 11/27/2018 1027   CL 100 11/27/2018 1027   CO2 25 11/27/2018 1027   GLUCOSE 120 (H) 11/27/2018 1027   GLUCOSE 126 (H) 06/13/2018 0902   BUN 19 11/27/2018 1027   CREATININE 0.88 11/27/2018 1027   CALCIUM 10.1 11/27/2018 1027   GFRNONAA 67 11/27/2018 1027   GFRAA 77 11/27/2018 1027   Lab Results  Component Value Date   HGBA1C 6.1 (H) 11/27/2018   HGBA1C 6.3 06/13/2018   HGBA1C 7.4 (H) 03/10/2018   Lab Results  Component Value Date   INSULIN 9.1 11/27/2018   INSULIN 15.0 03/10/2018   CBC    Component Value Date/Time   WBC 6.5 03/10/2018 0839   WBC 10.3 09/24/2007 0515   RBC 4.47 03/10/2018 0839   RBC 3.89 09/24/2007 0515   HGB 12.1 03/10/2018 0839   HCT 37.6 03/10/2018 0839   PLT 208 09/24/2007 0515   MCV 84 03/10/2018 0839   MCH 27.1 03/10/2018 0839   MCHC 32.2 03/10/2018 0839   MCHC 34.6 09/24/2007 0515   RDW 14.7 03/10/2018 0839   LYMPHSABS 2.0 03/10/2018 0839   EOSABS 0.3 03/10/2018 0839   BASOSABS 0.0 03/10/2018 0839   Iron/TIBC/Ferritin/ %Sat No results found for: IRON, TIBC, FERRITIN, IRONPCTSAT Lipid Panel     Component Value Date/Time   CHOL 149 03/10/2018 0839   TRIG 125 03/10/2018 0839   HDL 42 03/10/2018 0839   LDLCALC 82 03/10/2018 0839   Hepatic Function Panel     Component Value Date/Time   PROT 7.5 11/27/2018 1027   ALBUMIN 4.6 11/27/2018 1027   AST 15 11/27/2018 1027   ALT 11 11/27/2018 1027   ALKPHOS 79 11/27/2018 1027   BILITOT 0.6 11/27/2018 1027      Component Value Date/Time   TSH 1.300 03/10/2018 0839      OBESITY BEHAVIORAL INTERVENTION VISIT  Today's visit was # 30   Starting weight: 237 lbs Starting date: 03/10/18 Today's weight : 194 lbs  Today's date: 07/27/2019 Total lbs lost to date: 76    ASK: We discussed the diagnosis of obesity with [name] today and  [name] agreed to give [name] permission to discuss obesity behavioral  modification therapy today.  ASSESS: Angelika has the diagnosis of obesity and her BMI today is 35.47 Lynsee is in the action stage of change   ADVISE: Jossilyn was educated on the multiple health risks of obesity as well as the benefit of weight loss to improve her health. She was advised of the need for long term treatment and the importance of lifestyle modifications to improve her current health and to decrease her risk of future health problems.  AGREE: Multiple dietary modification options and treatment options were discussed and  Maicy agreed to follow the recommendations documented in the above note.  ARRANGE: Ardell was educated on the importance of frequent visits to treat obesity as outlined per CMS and USPSTF guidelines and agreed to schedule her next follow up appointment today.  Wilhemena Durie, am acting as transcriptionist for Briscoe Deutscher, DO  I have reviewed the above documentation for accuracy and completeness, and I agree with the above. Briscoe Deutscher, DO

## 2019-07-29 ENCOUNTER — Encounter (INDEPENDENT_AMBULATORY_CARE_PROVIDER_SITE_OTHER): Payer: Self-pay | Admitting: Family Medicine

## 2019-08-17 ENCOUNTER — Ambulatory Visit (INDEPENDENT_AMBULATORY_CARE_PROVIDER_SITE_OTHER): Payer: PPO | Admitting: Family Medicine

## 2019-08-17 ENCOUNTER — Other Ambulatory Visit: Payer: Self-pay

## 2019-08-17 ENCOUNTER — Encounter (INDEPENDENT_AMBULATORY_CARE_PROVIDER_SITE_OTHER): Payer: Self-pay | Admitting: Family Medicine

## 2019-08-17 VITALS — BP 167/91 | HR 56 | Temp 98.0°F | Ht 62.0 in | Wt 209.0 lb

## 2019-08-17 DIAGNOSIS — R609 Edema, unspecified: Secondary | ICD-10-CM

## 2019-08-17 DIAGNOSIS — Z6838 Body mass index (BMI) 38.0-38.9, adult: Secondary | ICD-10-CM | POA: Diagnosis not present

## 2019-08-17 DIAGNOSIS — R7303 Prediabetes: Secondary | ICD-10-CM | POA: Diagnosis not present

## 2019-08-17 DIAGNOSIS — E038 Other specified hypothyroidism: Secondary | ICD-10-CM | POA: Diagnosis not present

## 2019-08-17 DIAGNOSIS — E559 Vitamin D deficiency, unspecified: Secondary | ICD-10-CM

## 2019-08-17 DIAGNOSIS — F3289 Other specified depressive episodes: Secondary | ICD-10-CM | POA: Diagnosis not present

## 2019-08-17 MED ORDER — METFORMIN HCL 850 MG PO TABS: 850.0000 mg | ORAL_TABLET | Freq: Every day | ORAL | 0 refills | Status: AC

## 2019-08-18 NOTE — Progress Notes (Signed)
Office: [phone]  /  Fax: [phone]   HPI:   Chief Complaint: OBESITY Samantha Lozano is here to discuss her progress with her obesity treatment plan. She is on the Category 1 plan and is following her eating plan approximately 10 % of the time. She states she is walking for 30 minutes 6-7 times per week. Samantha Lozano truly enjoyed Thanksgiving. She states she ate, drank, and was merry. She is up 15 pounds. She admits to increased ETOH and some lower extremity edema. She denies chest pain, shortness of breath, or dizziness. Her blood pressure is elevated today.  Her weight is 209 lb (94.8 kg) today and has gained 15 lbs since her last visit. She has lost 0 lbs since starting treatment with [name].  Vitamin D Deficiency Samantha Lozano has a diagnosis of vitamin D deficiency. She is stable on prescription Vit D and denies nausea, vomiting or muscle weakness.  Pre-Diabetes Samantha Lozano has a diagnosis of pre-diabetes based on her elevated Hgb A1c and was informed this puts her at greater risk of developing diabetes. She is taking metformin currently and continues to work on diet and exercise to decrease risk of diabetes.  Hypothyroidism Samantha Lozano has a diagnosis of hypothyroidism. She is stable on levothyroxine. She denies hot or cold intolerance or palpitations.  Edema Samantha Lozano has lower extremity edema. I wanted to get labs (CMP and BNP)., but the patient is pressed for time today and declined. Red flags were reviewed.  Depression with Emotional Eating Behaviors Samantha Lozano is struggling with emotional eating and using food for comfort to the extent that it is negatively impacting her health. She often snacks when she is not hungry. Adamae sometimes feels she is out of control and then feels guilty that she made poor food choices. She has been working on behavior modification techniques to help reduce her emotional eating and has been somewhat successful. She shows no sign of suicidal or homicidal ideations.  Depression  screen PHQ 2/9 03/10/2018  Decreased Interest 0  Down, Depressed, Hopeless 1  PHQ - 2 Score 1  Altered sleeping 0  Tired, decreased energy 1  Change in appetite 0  Feeling bad or failure about yourself  1  Trouble concentrating 0  Moving slowly or fidgety/restless 0  Suicidal thoughts 0  PHQ-9 Score 3  Difficult doing work/chores Not difficult at all    ASSESSMENT AND PLAN:  Vitamin D deficiency  Other specified hypothyroidism  Edema, unspecified type  Other depression, emotional eating  Prediabetes - Plan: metFORMIN (GLUCOPHAGE) 850 MG tablet  Class 2 severe obesity with serious comorbidity and body mass index (BMI) of 38.0 to 38.9 in adult, unspecified obesity type (HCC)  PLAN:  Vitamin D Deficiency Low vitamin D level contributes to fatigue and are associated with obesity, breast, and colon cancer. Samantha Lozano agrees to continue taking prescription Vit D 50,000 IU every 14 days #2 and we will refill for 1 month. She will follow up for routine testing of vitamin D, at least 2-3 times per year to avoid over-replacement. Samantha Lozano agrees to follow up with our clinic in 2 weeks.  Pre-Diabetes Samantha Lozano will continue to work on weight loss, exercise, and decreasing simple carbohydrates to help decrease the risk of diabetes. Samantha Lozano agrees to continue taking metformin 850 mg PO daily #30 and we will refill for 1 month. Samantha Lozano agrees to follow up with our clinic in 2 weeks.  Hypothyroidism Samantha Lozano was informed of the importance of good thyroid control for overall health and that supertheraputic thyroid levels  are dangerous and will not improve weight loss results. We will continue to monitor.  Lower Extremity Edema Samantha Lozano is to increase her water intake and elevation, she is to decrease salt intake and wear compression hose.  Emotional Eating Behaviors (other depression) Behavior modification techniques were discussed today to help Samantha Lozano deal with her emotional/non-hunger eating behaviors.  We will continue to follow and monitor her progress.  Obesity Samantha Lozano is currently in the action stage of change. As such, her goal is to continue with weight loss efforts She has agreed to follow the Category 1 plan Samantha Lozano has been instructed to work up to a goal of 150 minutes of combined cardio and strengthening exercise per week for weight loss and overall health benefits. We discussed the following Behavioral Modification Strategies today: increasing lean protein intake, decreasing simple carbohydrates, increasing vegetables, increase H20 intake, work on meal planning and easy cooking plans and holiday eating strategies    Samantha Lozano has agreed to follow up with our clinic in 2 weeks. She was informed of the importance of frequent follow up visits to maximize her success with intensive lifestyle modifications for her multiple health conditions.  ALLERGIES: Allergies  Allergen Reactions  . Lisinopril Cough    MEDICATIONS: Current Outpatient Medications on File Prior to Visit  Medication Sig Dispense Refill  . aspirin EC 81 MG tablet Take 81 mg by mouth daily.    . carvedilol (COREG) 25 MG tablet Take 25 mg by mouth 2 (two) times daily with a meal.    . glucose blood test strip Check BS twice daily 100 each 0  . hydrochlorothiazide (HYDRODIURIL) 25 MG tablet Take 25 mg by mouth daily.    [name] levothyroxine (SYNTHROID, LEVOTHROID) 125 MCG tablet Take 125 mcg by mouth daily before breakfast.    . lovastatin (MEVACOR) 40 MG tablet Take 40 mg by mouth at bedtime.    . Omega-3 Fatty Acids (FISH OIL) 435 MG CAPS Take 1 capsule by mouth daily at 2 PM.    . Vitamin D, Ergocalciferol, (DRISDOL) 1.25 MG (50000 UT) CAPS capsule Take 1 capsule (50,000 Units total) by mouth every 14 (fourteen) days. 6 capsule 0   No current facility-administered medications on file prior to visit.     PAST MEDICAL HISTORY: Past Medical History:  Diagnosis Date  . Diabetes mellitus, type 2 ([place])   . HLD  (hyperlipidemia)   . HTN (hypertension)   . Leg edema   . Other specified disorders of thyroid     PAST SURGICAL HISTORY: Past Surgical History:  Procedure Laterality Date  . ABDOMINAL HYSTERECTOMY    . THYROID SURGERY      SOCIAL HISTORY: Social History   Tobacco Use  . Smoking status: Never Smoker  . Smokeless tobacco: Never Used  Substance Use Topics  . Alcohol use: Not Currently  . Drug use: Not Currently    FAMILY HISTORY: Family History  Problem Relation Age of Onset  . Diabetes Mother   . Hypertension Mother   . Cancer Mother   . Obesity Mother   . Hypertension Father   . Cancer Father   . Obesity Father     ROS: Review of Systems  Constitutional: Negative for weight loss.  Respiratory: Negative for shortness of breath.   Cardiovascular: Negative for chest pain and palpitations.  Gastrointestinal: Negative for nausea and vomiting.  Musculoskeletal:       Negative muscle weakness  Neurological: Negative for dizziness.  Endo/Heme/Allergies:       Negative  hot/cold intolerance  Psychiatric/Behavioral: Positive for depression. Negative for suicidal ideas.    PHYSICAL EXAM: Blood pressure (!) 167/91, pulse (!) 56, temperature 98 F (36.7 C), temperature source Oral, height 5\' 2"  (1.575 m), weight 209 lb (94.8 kg), SpO2 98 %. Body mass index is 38.23 kg/m. Physical Exam Vitals signs reviewed.  Constitutional:      Appearance: Normal appearance. She is obese.  Cardiovascular:     Rate and Rhythm: Normal rate.     Pulses: Normal pulses.  Pulmonary:     Effort: Pulmonary effort is normal.     Breath sounds: Normal breath sounds.  Musculoskeletal: Normal range of motion.     Right lower leg: Edema present.     Left lower leg: Edema present.  Skin:    General: Skin is warm and dry.  Neurological:     Mental Status: She is alert and oriented to person, place, and time.  Psychiatric:        Mood and Affect: Mood normal.        Behavior: Behavior  normal.     RECENT LABS AND TESTS: BMET    Component Value Date/Time   NA 141 11/27/2018 1027   K 4.7 11/27/2018 1027   CL 100 11/27/2018 1027   CO2 25 11/27/2018 1027   GLUCOSE 120 (H) 11/27/2018 1027   GLUCOSE 126 (H) 06/13/2018 0902   BUN 19 11/27/2018 1027   CREATININE 0.88 11/27/2018 1027   CALCIUM 10.1 11/27/2018 1027   GFRNONAA 67 11/27/2018 1027   GFRAA 77 11/27/2018 1027   Lab Results  Component Value Date   HGBA1C 6.1 (H) 11/27/2018   HGBA1C 6.3 06/13/2018   HGBA1C 7.4 (H) 03/10/2018   Lab Results  Component Value Date   INSULIN 9.1 11/27/2018   INSULIN 15.0 03/10/2018   CBC    Component Value Date/Time   WBC 6.5 03/10/2018 0839   WBC 10.3 09/24/2007 0515   RBC 4.47 03/10/2018 0839   RBC 3.89 09/24/2007 0515   HGB 12.1 03/10/2018 0839   HCT 37.6 03/10/2018 0839   PLT 208 09/24/2007 0515   MCV 84 03/10/2018 0839   MCH 27.1 03/10/2018 0839   MCHC 32.2 03/10/2018 0839   MCHC 34.6 09/24/2007 0515   RDW 14.7 03/10/2018 0839   LYMPHSABS 2.0 03/10/2018 0839   EOSABS 0.3 03/10/2018 0839   BASOSABS 0.0 03/10/2018 0839   Iron/TIBC/Ferritin/ %Sat No results found for: IRON, TIBC, FERRITIN, IRONPCTSAT Lipid Panel     Component Value Date/Time   CHOL 149 03/10/2018 0839   TRIG 125 03/10/2018 0839   HDL 42 03/10/2018 0839   LDLCALC 82 03/10/2018 0839   Hepatic Function Panel     Component Value Date/Time   PROT 7.5 11/27/2018 1027   ALBUMIN 4.6 11/27/2018 1027   AST 15 11/27/2018 1027   ALT 11 11/27/2018 1027   ALKPHOS 79 11/27/2018 1027   BILITOT 0.6 11/27/2018 1027      Component Value Date/Time   TSH 1.300 03/10/2018 0839      OBESITY BEHAVIORAL INTERVENTION VISIT  Today's visit was # 31   Starting weight: 195 lbs Starting date: 03/10/18 Today's weight : 209 lbs Today's date: 08/17/2019 Total lbs lost to date: 0 At least 15 minutes were spent on discussing the following behavioral intervention visit.   ASK: We discussed the  diagnosis of obesity with Samantha Lozano today and Samantha Lozano agreed to give [name] permission to discuss obesity behavioral modification therapy today.  ASSESS: Ianna has  the diagnosis of obesity and her BMI today is 38.22 Samantha Lozano is in the action stage of change   ADVISE: Samantha Lozano was educated on the multiple health risks of obesity as well as the benefit of weight loss to improve her health. She was advised of the need for long term treatment and the importance of lifestyle modifications to improve her current health and to decrease her risk of future health problems.  AGREE: Multiple dietary modification options and treatment options were discussed and  Samantha Lozano agreed to follow the recommendations documented in the above note.  ARRANGE: Samantha Lozano was educated on the importance of frequent visits to treat obesity as outlined per CMS and USPSTF guidelines and agreed to schedule her next follow up appointment today.  Samantha Lozano, am acting as transcriptionist for Briscoe Deutscher, DO  I have reviewed the above documentation for accuracy and completeness, and I agree with the above. Briscoe Deutscher, DO

## 2019-08-19 ENCOUNTER — Encounter (INDEPENDENT_AMBULATORY_CARE_PROVIDER_SITE_OTHER): Payer: Self-pay | Admitting: Family Medicine

## 2019-08-19 MED ORDER — VITAMIN D (ERGOCALCIFEROL) 1.25 MG (50000 UNIT) PO CAPS: 50000.0000 [IU] | ORAL_CAPSULE | ORAL | 0 refills | Status: DC

## 2019-08-19 NOTE — Addendum Note (Signed)
Addended by: Wilfrid Lund on: 08/19/2019 07:43 AM   Modules accepted: Orders

## 2019-09-07 DIAGNOSIS — E1159 Type 2 diabetes mellitus with other circulatory complications: Secondary | ICD-10-CM | POA: Diagnosis not present

## 2019-09-07 DIAGNOSIS — E039 Hypothyroidism, unspecified: Secondary | ICD-10-CM | POA: Diagnosis not present

## 2019-09-07 DIAGNOSIS — I10 Essential (primary) hypertension: Secondary | ICD-10-CM | POA: Diagnosis not present

## 2019-09-07 DIAGNOSIS — E782 Mixed hyperlipidemia: Secondary | ICD-10-CM | POA: Diagnosis not present

## 2019-09-07 DIAGNOSIS — Z0000 Encounter for general adult medical examination without abnormal findings: Secondary | ICD-10-CM | POA: Diagnosis not present

## 2019-09-14 ENCOUNTER — Ambulatory Visit (INDEPENDENT_AMBULATORY_CARE_PROVIDER_SITE_OTHER): Payer: PPO | Admitting: Family Medicine

## 2019-09-14 ENCOUNTER — Encounter (INDEPENDENT_AMBULATORY_CARE_PROVIDER_SITE_OTHER): Payer: Self-pay | Admitting: Family Medicine

## 2019-09-14 ENCOUNTER — Other Ambulatory Visit: Payer: Self-pay

## 2019-09-14 VITALS — BP 177/82 | HR 56 | Temp 98.0°F | Ht 62.0 in | Wt 210.0 lb

## 2019-09-14 DIAGNOSIS — Z6838 Body mass index (BMI) 38.0-38.9, adult: Secondary | ICD-10-CM | POA: Diagnosis not present

## 2019-09-14 DIAGNOSIS — I10 Essential (primary) hypertension: Secondary | ICD-10-CM

## 2019-09-14 DIAGNOSIS — E119 Type 2 diabetes mellitus without complications: Secondary | ICD-10-CM

## 2019-09-15 NOTE — Progress Notes (Addendum)
Office: [phone]  /  Fax: [phone]   HPI:  Chief Complaint: OBESITY Samantha Lozano is here to discuss her progress with her obesity treatment plan. She is on the Category 1 Plan and states she is following her eating plan approximately 0 % of the time. She states she is walking 60 minutes 2 times per week.  Samantha Lozano has continued to be off the plan due to gifts of food over the holidays. She has been eating too many sweets, however she has now cleaned the sweets out of her house. Samantha Lozano has not been eating the plan food. She plans on getting back on the plan today.  Diabetes II Well controlled but A1c has increased to 6.4 on 09/08/19 (up from 6.2). Her fasting CBGs range between 115 and 120, and range between 130 and 150 in the afternoon around 3:00 (about 2 hours post prandial). She denies hypoglycemia.  Labs were discussed with patient today.  Hypertension Samantha Lozano has a diagnosis of hypertension. She denies chest pain or shortness of breath. Her hypertension has not been well controlled over the last several visits. We discussed adding another medication, but she would like to wait. She is on Coreg 25 BID and pulse is 56 today. She is on max dose of HCTZ. BP Readings from Last 3 Encounters:  09/14/19 (!) 177/82  08/17/19 (!) 167/91  07/27/19 (!) 166/81     Today's visit was # 73 Starting weight: 237 lbs Starting date: 03/10/2018 Today's weight : 210 lbs Today's date: 09/14/2019 Total lbs lost to date: 27 Total lbs lost since last in-office visit: 0  ASSESSMENT AND PLAN:  Type 2 diabetes mellitus without complication, without long-term current use of insulin (HCC)  Essential hypertension  Class 2 severe obesity with serious comorbidity and body mass index (BMI) of 38.0 to 38.9 in adult, unspecified obesity type ([place])  PLAN:  Diabetes II  Samantha Lozano will continue metformin and she will reduce her simple carbohydrates. Samantha Lozano will follow up with our clinic in 2  weeks.  Hypertension Samantha Lozano is working on healthy weight loss and exercise to improve blood pressure control. I encouraged her to decrease salt in her diet. She will continue her blood pressure medications and follow up as directed. I will continue to monitor and possibly add ARB at next visit.  Obesity Samantha Lozano is currently in the action stage of change. As such, her goal is to continue with weight loss efforts. She has agreed to Category 1 Plan. Samantha Lozano will continue walking for 60 minutes, 2 times per week for weight loss and overall health benefits. We discussed the following Behavioral Modification Strategies today: decreasing simple carbohydrates, better snacking choices, avoiding temptations and planning for success.  Samantha Lozano has agreed to follow-up with our clinic in 2 weeks. She was informed of the importance of frequent follow-up visits to maximize her success with intensive lifestyle modifications for her multiple health conditions.  ALLERGIES: Allergies  Allergen Reactions  . Lisinopril Cough    MEDICATIONS: Current Outpatient Medications on File Prior to Visit  Medication Sig Dispense Refill  . aspirin EC 81 MG tablet Take 81 mg by mouth daily.    . carvedilol (COREG) 25 MG tablet Take 25 mg by mouth 2 (two) times daily with a meal.    . glucose blood test strip Check BS twice daily 100 each 0  . hydrochlorothiazide (HYDRODIURIL) 25 MG tablet Take 25 mg by mouth daily.    [name] levothyroxine (SYNTHROID, LEVOTHROID) 125 MCG tablet Take 125 mcg by  mouth daily before breakfast.    . lovastatin (MEVACOR) 40 MG tablet Take 40 mg by mouth at bedtime.    . metFORMIN (GLUCOPHAGE) 850 MG tablet Take 1 tablet (850 mg total) by mouth daily. with food 30 tablet 0  . Omega-3 Fatty Acids (FISH OIL) 435 MG CAPS Take 1 capsule by mouth daily at 2 PM.    . Vitamin D, Ergocalciferol, (DRISDOL) 1.25 MG (50000 UT) CAPS capsule Take 1 capsule (50,000 Units total) by mouth every 14 (fourteen) days. 2  capsule 0   No current facility-administered medications on file prior to visit.    PAST MEDICAL HISTORY: Past Medical History:  Diagnosis Date  . Diabetes mellitus, type 2 ([place])   . HLD (hyperlipidemia)   . HTN (hypertension)   . Leg edema   . Other specified disorders of thyroid     PAST SURGICAL HISTORY: Past Surgical History:  Procedure Laterality Date  . ABDOMINAL HYSTERECTOMY    . THYROID SURGERY      SOCIAL HISTORY: Social History   Tobacco Use  . Smoking status: Never Smoker  . Smokeless tobacco: Never Used  Substance Use Topics  . Alcohol use: Not Currently  . Drug use: Not Currently    FAMILY HISTORY: Family History  Problem Relation Age of Onset  . Diabetes Mother   . Hypertension Mother   . Cancer Mother   . Obesity Mother   . Hypertension Father   . Cancer Father   . Obesity Father     ROS: Review of Systems  Constitutional: Negative for weight loss.  Respiratory: Negative for shortness of breath.   Cardiovascular: Negative for chest pain.  Endo/Heme/Allergies:       Negative for hypoglycemia    PHYSICAL EXAM: Blood pressure (!) 177/82, pulse (!) 56, temperature 98 F (36.7 C), temperature source Oral, height 5\' 2"  (1.575 m), weight 210 lb (95.3 kg), SpO2 98 %. Body mass index is 38.41 kg/m. Physical Exam Vitals reviewed.  Constitutional:      General: She is not in acute distress.    Appearance: Normal appearance. She is well-developed. She is obese.  Pulmonary:     Effort: Pulmonary effort is normal.  Neurological:     Mental Status: She is alert and oriented to person, place, and time.  Psychiatric:        Mood and Affect: Mood normal.        Behavior: Behavior normal.     RECENT LABS AND TESTS: BMET    Component Value Date/Time   NA 141 11/27/2018 1027   K 4.7 11/27/2018 1027   CL 100 11/27/2018 1027   CO2 25 11/27/2018 1027   GLUCOSE 120 (H) 11/27/2018 1027   GLUCOSE 126 (H) 06/13/2018 0902   BUN 19 11/27/2018 1027    CREATININE 0.88 11/27/2018 1027   CALCIUM 10.1 11/27/2018 1027   GFRNONAA 67 11/27/2018 1027   GFRAA 77 11/27/2018 1027   Lab Results  Component Value Date   HGBA1C 6.1 (H) 11/27/2018   HGBA1C 6.3 06/13/2018   HGBA1C 7.4 (H) 03/10/2018   Lab Results  Component Value Date   INSULIN 9.1 11/27/2018   INSULIN 15.0 03/10/2018   CBC    Component Value Date/Time   WBC 6.5 03/10/2018 0839   WBC 10.3 09/24/2007 0515   RBC 4.47 03/10/2018 0839   RBC 3.89 09/24/2007 0515   HGB 12.1 03/10/2018 0839   HCT 37.6 03/10/2018 0839   PLT 208 09/24/2007 0515   MCV 84  03/10/2018 0839   MCH 27.1 03/10/2018 0839   MCHC 32.2 03/10/2018 0839   MCHC 34.6 09/24/2007 0515   RDW 14.7 03/10/2018 0839   LYMPHSABS 2.0 03/10/2018 0839   EOSABS 0.3 03/10/2018 0839   BASOSABS 0.0 03/10/2018 0839   Iron/TIBC/Ferritin/ %Sat No results found for: IRON, TIBC, FERRITIN, IRONPCTSAT Lipid Panel     Component Value Date/Time   CHOL 149 03/10/2018 0839   TRIG 125 03/10/2018 0839   HDL 42 03/10/2018 0839   LDLCALC 82 03/10/2018 0839   Hepatic Function Panel     Component Value Date/Time   PROT 7.5 11/27/2018 1027   ALBUMIN 4.6 11/27/2018 1027   AST 15 11/27/2018 1027   ALT 11 11/27/2018 1027   ALKPHOS 79 11/27/2018 1027   BILITOT 0.6 11/27/2018 1027      Component Value Date/Time   TSH 1.300 03/10/2018 0839     OBESITY BEHAVIORAL INTERVENTION VISIT DOCUMENTATION FOR INSURANCE (~15 minutes)  ASK: We discussed the diagnosis of obesity with Samantha Lozano today and Samantha Lozano agreed to give [name] permission to discuss obesity behavioral modification therapy today.  ASSESS: Samantha Lozano has the diagnosis of obesity and her BMI today is 38.4  Samantha Lozano is in the action stage of change.   ADVISE: Samantha Lozano was educated on the multiple health risks of obesity as well as the benefit of weight loss to improve her health. She was advised of the need for long term treatment and the importance of lifestyle  modifications to improve her current health and to decrease her risk of future health problems.  AGREE: Multiple dietary modification options and treatment options were discussed and  Samantha Lozano agreed to follow the recommendations documented in the above note.  ARRANGE: Samantha Lozano was educated on the importance of frequent visits to treat obesity as outlined per CMS and USPSTF guidelines and agreed to schedule her next follow up appointment today.   Corey Skains, am acting as Location manager for [employer], FNP-C.   I have reviewed the above documentation for accuracy and completeness, and I agree with the above.  - Russell Quinney, FNP-C.

## 2019-09-16 ENCOUNTER — Encounter (INDEPENDENT_AMBULATORY_CARE_PROVIDER_SITE_OTHER): Payer: Self-pay | Admitting: Family Medicine

## 2019-09-16 DIAGNOSIS — I10 Essential (primary) hypertension: Secondary | ICD-10-CM | POA: Insufficient documentation

## 2019-09-28 ENCOUNTER — Other Ambulatory Visit: Payer: Self-pay

## 2019-09-28 ENCOUNTER — Ambulatory Visit (INDEPENDENT_AMBULATORY_CARE_PROVIDER_SITE_OTHER): Payer: PPO | Admitting: Family Medicine

## 2019-09-28 ENCOUNTER — Encounter (INDEPENDENT_AMBULATORY_CARE_PROVIDER_SITE_OTHER): Payer: Self-pay | Admitting: Family Medicine

## 2019-09-28 VITALS — BP 137/77 | HR 57 | Temp 98.0°F | Ht 62.0 in | Wt 205.0 lb

## 2019-09-28 DIAGNOSIS — Z6837 Body mass index (BMI) 37.0-37.9, adult: Secondary | ICD-10-CM | POA: Diagnosis not present

## 2019-09-28 DIAGNOSIS — E119 Type 2 diabetes mellitus without complications: Secondary | ICD-10-CM | POA: Diagnosis not present

## 2019-09-28 DIAGNOSIS — I10 Essential (primary) hypertension: Secondary | ICD-10-CM | POA: Diagnosis not present

## 2019-09-28 MED ORDER — LOSARTAN POTASSIUM 50 MG PO TABS: 50.0000 mg | ORAL_TABLET | Freq: Every day | ORAL | 0 refills | Status: DC

## 2019-09-29 NOTE — Progress Notes (Signed)
Chief Complaint:   OBESITY ANASTASSIA NOACK is here to discuss her progress with her obesity treatment plan along with follow-up of her obesity related diagnoses. Mckaylah is on the Category 1 Plan and states she is following her eating plan approximately 85% of the time. Kimbra states she is walking 60 minutes 7 times per week.  Today's visit was #: 54 Starting weight: 237 lbs Starting date: 03/10/2018 Today's weight: 205 lbs Today's date: 09/28/2019 Total lbs lost to date: 32 Total lbs lost since last in-office visit: 5  Interim History: Afsana reports getting back on the plan over the last few weeks even though it has been very hard she reports. She struggles with eating too many carbohydrates such as bread.  Subjective:   Essential hypertension  Kambra has worsening hypertension. Her home blood pressures have been elevated. She had a very high blood pressure at home (diastolic 932), and she experienced "pressure in her head". She is afraid of stroke as her friend recently had one. We had discussed adding a medication at her last visit but she was reluctant.   BP Readings from Last 3 Encounters:  09/28/19 137/77  09/14/19 (!) 177/82  08/17/19 (!) 167/91   Lab Results  Component Value Date   CREATININE 0.88 11/27/2018   CREATININE 0.74 07/24/2018   CREATININE 0.79 04/28/2018   Type 2 diabetes mellitus Niaya is well controlled on Metformin daily. Her last A1c was at 6.1 (11/27/18). Her fasting blood sugars run in the 120's and her 2 hour post prandial blood sugars range between 130 and 150's.  Lab Results  Component Value Date   HGBA1C 6.1 (H) 11/27/2018   HGBA1C 6.3 06/13/2018   HGBA1C 7.4 (H) 03/10/2018   Lab Results  Component Value Date   LDLCALC 82 03/10/2018   CREATININE 0.88 11/27/2018    Assessment/Plan:   Essential hypertension  Shayne is working on healthy weight loss and exercise to improve blood pressure control. Alliyah agreed to start Losartan and a  new prescription was written for Losartan 50 mg daily #30 with no refills, and she will continue HCTZ and Coreg. Parlee will check her blood pressure at home.  Type 2 diabetes mellitus  Good blood sugar control is important to decrease the likelihood of diabetic complications such as nephropathy, neuropathy, limb loss, blindness, coronary artery disease, and death. Intensive lifestyle modification including diet, exercise and weight loss are the first line of treatment for diabetes. Buffey will continue metformin.  Obesity Larin is currently in the action stage of change. As such, her goal is to continue with weight loss efforts. She has agreed to the Category 1 Plan.   Adlean will continue walking for 60 minutes, 7 times per week.  Behavioral modification strategies: decreasing simple carbohydrates and planning for success.  Camani has agreed to follow-up with our clinic in 2 weeks. She was informed of the importance of frequent follow-up visits to maximize her success with intensive lifestyle modifications for her multiple health conditions.   Objective:   Blood pressure 137/77, pulse (!) 57, temperature 98 F (36.7 C), height 5\' 2"  (1.575 m), weight 205 lb (93 kg), SpO2 99 %. Body mass index is 37.49 kg/m.  General: Cooperative, alert, well developed, in no acute distress. HEENT: Conjunctivae and lids unremarkable. Cardiovascular: Regular rhythm.  Lungs: Normal work of breathing. Neurologic: No focal deficits.   Lab Results  Component Value Date   CREATININE 0.88 11/27/2018   BUN 19 11/27/2018   NA 141  11/27/2018   K 4.7 11/27/2018   CL 100 11/27/2018   CO2 25 11/27/2018   Lab Results  Component Value Date   ALT 11 11/27/2018   AST 15 11/27/2018   ALKPHOS 79 11/27/2018   BILITOT 0.6 11/27/2018   Lab Results  Component Value Date   HGBA1C 6.1 (H) 11/27/2018   HGBA1C 6.3 06/13/2018   HGBA1C 7.4 (H) 03/10/2018   Lab Results  Component Value Date   INSULIN 9.1  11/27/2018   INSULIN 15.0 03/10/2018   Lab Results  Component Value Date   TSH 1.300 03/10/2018   Lab Results  Component Value Date   CHOL 149 03/10/2018   HDL 42 03/10/2018   LDLCALC 82 03/10/2018   TRIG 125 03/10/2018   Lab Results  Component Value Date   WBC 6.5 03/10/2018   HGB 12.1 03/10/2018   HCT 37.6 03/10/2018   MCV 84 03/10/2018   PLT 208 09/24/2007   No results found for: IRON, TIBC, FERRITIN   Ref. Range 05/14/2019 09:50  Vitamin D, 25-Hydroxy Latest Ref Range: 30.0 - 100.0 ng/mL 47.3   Obesity Behavioral Intervention Documentation for Insurance:   Approximately 15 minutes were spent on the discussion below.  ASK: We discussed the diagnosis of obesity with [name] today and Fanta agreed to give [name] permission to discuss obesity behavioral modification therapy today.  ASSESS: Sonika has the diagnosis of obesity and her BMI today is 37.49. Mylisa is in the action stage of change.   ADVISE: Calirose was educated on the multiple health risks of obesity as well as the benefit of weight loss to improve her health. She was advised of the need for long term treatment and the importance of lifestyle modifications to improve her current health and to decrease her risk of future health problems.  AGREE: Multiple dietary modification options and treatment options were discussed and Edmonia agreed to follow the recommendations documented in the above note.  ARRANGE: Keierra was educated on the importance of frequent visits to treat obesity as outlined per CMS and USPSTF guidelines and agreed to schedule her next follow up appointment today.  Attestation Statements:   Reviewed by clinician on day of visit: allergies, medications, problem list, medical history, surgical history, family history, social history, and previous encounter notes.  Corey Skains, am acting as Location manager for [employer], FNP-C.  I have reviewed the above documentation for accuracy and  completeness, and I agree with the above. -  Nechuma Boven [employer], FNP-C

## 2019-10-02 ENCOUNTER — Ambulatory Visit: Payer: PPO | Attending: Internal Medicine

## 2019-10-02 DIAGNOSIS — Z23 Encounter for immunization: Secondary | ICD-10-CM

## 2019-10-02 NOTE — Progress Notes (Signed)
   Covid-19 Vaccination Clinic  Name:  Samantha Lozano    MRN: [id number] DOB: 1946-02-19  10/02/2019  [name] was observed post Covid-19 immunization for 15 minutes without incidence. She was provided with Vaccine Information Sheet and instruction to access the V-Safe system.   [name] was instructed to call 911 with any severe reactions post vaccine: [name] Difficulty breathing  . Swelling of your face and throat  . A fast heartbeat  . A bad rash all over your body  . Dizziness and weakness    Immunizations Administered    Name Date Dose VIS Date Route   Pfizer COVID-19 Vaccine 10/02/2019 10:24 AM 0.3 mL 08/21/2019 Intramuscular   Manufacturer: [place]   Lot: [id number]   [place]: [address]-2

## 2019-10-13 ENCOUNTER — Encounter (INDEPENDENT_AMBULATORY_CARE_PROVIDER_SITE_OTHER): Payer: Self-pay | Admitting: Family Medicine

## 2019-10-13 ENCOUNTER — Other Ambulatory Visit: Payer: Self-pay

## 2019-10-13 ENCOUNTER — Ambulatory Visit (INDEPENDENT_AMBULATORY_CARE_PROVIDER_SITE_OTHER): Payer: PPO | Admitting: Family Medicine

## 2019-10-13 VITALS — BP 134/86 | HR 56 | Temp 97.9°F | Ht 62.0 in | Wt 208.0 lb

## 2019-10-13 DIAGNOSIS — E559 Vitamin D deficiency, unspecified: Secondary | ICD-10-CM | POA: Diagnosis not present

## 2019-10-13 DIAGNOSIS — I10 Essential (primary) hypertension: Secondary | ICD-10-CM | POA: Diagnosis not present

## 2019-10-13 DIAGNOSIS — Z6838 Body mass index (BMI) 38.0-38.9, adult: Secondary | ICD-10-CM

## 2019-10-13 MED ORDER — VITAMIN D (ERGOCALCIFEROL) 1.25 MG (50000 UNIT) PO CAPS: 50000.0000 [IU] | ORAL_CAPSULE | ORAL | 0 refills | Status: DC

## 2019-10-13 NOTE — Progress Notes (Signed)
Chief Complaint:   OBESITY Samantha Lozano is here to discuss her progress with her obesity treatment plan along with follow-up of her obesity related diagnoses. Samantha Lozano is on the Category 1 Plan and states she is following her eating plan approximately 75% of the time. Samantha Lozano states she is walking for 60 minutes 7 times per week.  Today's visit was #: 59 Starting weight: 237 Starting date: 03/10/18 Today's weight: 208 lbs Today's date: 10/13/2019 Total lbs lost to date: 29 Total lbs lost since last in-office visit: 0  Interim History: Samantha Lozano reports eating too many simple carbohydrates such as potatoes over the past few weeks. She is getting her protein in at all meals. She has had her COVID vaccine #1.  Subjective:   1. Vitamin D deficiency Samantha Lozano's level is maintained on prescription Vit D. She denies nausea, vomiting, or muscle weakness. She had labs checked at [facility] recently, but she does not know if they checked her Vit D level.  2. Essential hypertension Samantha Lozano blood pressure is much better than it has been for the last few visits. Losartan was added to hydrochlorothiazide and Coreg at last OV. She reports that she feels better. She had told me that she had "pressure in her head" when her BP was high. She avoids extra salt.  Assessment/Plan:   1. Vitamin D deficiency Low Vitamin D level contributes to fatigue and are associated with obesity, breast, and colon cancer. We will refill prescription Vitamin D for 90 days with no refill. Samantha Lozano will follow-up for routine testing of Vitamin D, at least 2-3 times per year to avoid over-replacement. She will bring in her labs to her next visit.  - Vitamin D, Ergocalciferol, (DRISDOL) 1.25 MG (50000 UNIT) CAPS capsule; Take 1 capsule (50,000 Units total) by mouth every 14 (fourteen) days.  Dispense: 6 capsule; Refill: 0  2. Essential hypertension Samantha Lozano is working on healthy weight loss and exercise to improve blood pressure control. Samantha Lozano  agreed to continue all of her medications. We will watch for signs of hypotension as she continues her lifestyle modifications.  3. Class 2 severe obesity with serious comorbidity and body mass index (BMI) of 38.0 to 38.9 in adult, unspecified obesity type (HCC) Samantha Lozano is currently in the action stage of change. As such, her goal is to continue with weight loss efforts. She has agreed to the Category 1 Plan.   Exercise goals: Samantha Lozano is to continue her current exercise regimen.  Behavioral modification strategies: planning for success.  Samantha Lozano has agreed to follow-up with our clinic in 2 weeks. She was informed of the importance of frequent follow-up visits to maximize her success with intensive lifestyle modifications for her multiple health conditions.   Objective:   Blood pressure 134/86, pulse (!) 56, temperature 97.9 F (36.6 C), temperature source Oral, height 5\' 2"  (1.575 m), weight 208 lb (94.3 kg), SpO2 98 %. Body mass index is 38.04 kg/m.  General: Cooperative, alert, well developed, in no acute distress. HEENT: Conjunctivae and lids unremarkable. Cardiovascular: Regular rhythm.  Lungs: Normal work of breathing. Neurologic: No focal deficits.   Lab Results  Component Value Date   CREATININE 0.88 11/27/2018   BUN 19 11/27/2018   NA 141 11/27/2018   K 4.7 11/27/2018   CL 100 11/27/2018   CO2 25 11/27/2018   Lab Results  Component Value Date   ALT 11 11/27/2018   AST 15 11/27/2018   ALKPHOS 79 11/27/2018   BILITOT 0.6 11/27/2018   Lab Results  Component Value Date   HGBA1C 6.1 (H) 11/27/2018   HGBA1C 6.3 06/13/2018   HGBA1C 7.4 (H) 03/10/2018   Lab Results  Component Value Date   INSULIN 9.1 11/27/2018   INSULIN 15.0 03/10/2018   Lab Results  Component Value Date   TSH 1.300 03/10/2018   Lab Results  Component Value Date   CHOL 149 03/10/2018   HDL 42 03/10/2018   LDLCALC 82 03/10/2018   TRIG 125 03/10/2018   Lab Results  Component Value Date    WBC 6.5 03/10/2018   HGB 12.1 03/10/2018   HCT 37.6 03/10/2018   MCV 84 03/10/2018   PLT 208 09/24/2007   No results found for: IRON, TIBC, FERRITIN  Obesity Behavioral Intervention Documentation for Insurance:   Approximately 15 minutes were spent on the discussion below.  ASK: We discussed the diagnosis of obesity with Samantha Lozano today and Samantha Lozano agreed to give [name] permission to discuss obesity behavioral modification therapy today.  ASSESS: Samantha Lozano has the diagnosis of obesity and her BMI today is 38.03. Samantha Lozano is in the action stage of change.   ADVISE: Samantha Lozano was educated on the multiple health risks of obesity as well as the benefit of weight loss to improve her health. She was advised of the need for long term treatment and the importance of lifestyle modifications to improve her current health and to decrease her risk of future health problems.  AGREE: Multiple dietary modification options and treatment options were discussed and Dayja agreed to follow the recommendations documented in the above note.  ARRANGE: Samantha Lozano was educated on the importance of frequent visits to treat obesity as outlined per CMS and USPSTF guidelines and agreed to schedule her next follow up appointment today.  Attestation Statements:   Reviewed by clinician on day of visit: allergies, medications, problem list, medical history, surgical history, family history, social history, and previous encounter notes.   Wilhemena Durie, am acting as Location manager for [employer], FNP-C.  I have reviewed the above documentation for accuracy and completeness, and I agree with the above. -  Georgianne Fick, FNP

## 2019-10-14 ENCOUNTER — Encounter (INDEPENDENT_AMBULATORY_CARE_PROVIDER_SITE_OTHER): Payer: Self-pay | Admitting: Family Medicine

## 2019-10-21 ENCOUNTER — Other Ambulatory Visit (INDEPENDENT_AMBULATORY_CARE_PROVIDER_SITE_OTHER): Payer: Self-pay | Admitting: Family Medicine

## 2019-10-21 DIAGNOSIS — I10 Essential (primary) hypertension: Secondary | ICD-10-CM

## 2019-10-23 ENCOUNTER — Ambulatory Visit: Payer: PPO | Attending: Internal Medicine

## 2019-10-23 DIAGNOSIS — Z23 Encounter for immunization: Secondary | ICD-10-CM

## 2019-10-23 NOTE — Progress Notes (Signed)
   Covid-19 Vaccination Clinic  Name:  Samantha Lozano    MRN: [id number] DOB: Apr 03, 1946  10/23/2019  [name] was observed post Covid-19 immunization for 15 minutes without incidence. She was provided with Vaccine Information Sheet and instruction to access the V-Safe system.   [name] was instructed to call 911 with any severe reactions post vaccine: [name] Difficulty breathing  . Swelling of your face and throat  . A fast heartbeat  . A bad rash all over your body  . Dizziness and weakness    Immunizations Administered    Name Date Dose VIS Date Route   Pfizer COVID-19 Vaccine 10/23/2019 11:14 AM 0.3 mL 08/21/2019 Intramuscular   Manufacturer: [place]   Lot: [id number]   [place]: [id number]

## 2019-10-27 ENCOUNTER — Other Ambulatory Visit: Payer: Self-pay

## 2019-10-27 ENCOUNTER — Ambulatory Visit (INDEPENDENT_AMBULATORY_CARE_PROVIDER_SITE_OTHER): Payer: PPO | Admitting: Family Medicine

## 2019-10-27 ENCOUNTER — Encounter (INDEPENDENT_AMBULATORY_CARE_PROVIDER_SITE_OTHER): Payer: Self-pay | Admitting: Family Medicine

## 2019-10-27 VITALS — BP 148/77 | HR 63 | Temp 98.0°F | Ht 62.0 in | Wt 211.0 lb

## 2019-10-27 DIAGNOSIS — E119 Type 2 diabetes mellitus without complications: Secondary | ICD-10-CM

## 2019-10-27 DIAGNOSIS — I10 Essential (primary) hypertension: Secondary | ICD-10-CM

## 2019-10-27 DIAGNOSIS — Z6838 Body mass index (BMI) 38.0-38.9, adult: Secondary | ICD-10-CM

## 2019-10-27 MED ORDER — LOSARTAN POTASSIUM 50 MG PO TABS: 50.0000 mg | ORAL_TABLET | Freq: Every day | ORAL | 0 refills | Status: DC

## 2019-10-27 NOTE — Progress Notes (Signed)
Chief Complaint:   OBESITY Dorothy is here to discuss her progress with her obesity treatment plan along with follow-up of her obesity related diagnoses. Vaudie is on the Category 1 Plan and states she is following her eating plan approximately 0% of the time. Waverley states she is doing 0 minutes 0 times per week.  Today's visit was #: 78 Starting weight: 237 lbs Starting date: 03/10/18 Today's weight: 211 lbs Today's date: 10/27/2019 Total lbs lost to date: 26 Total lbs lost since last in-office visit: 0  Interim History: Ainsleigh is up to 211 lbs from a low weight of 187 lbs from August 2020. She is overeating the bread on the plan. She struggles with cravings for bread. She is eating all of the prescribed protein.  Subjective:   1. Essential hypertension Jariana's blood pressure is high today. She is compliant with all of her medications, hydrochlorothiazide, losartan, and Coreg. She denies chest pain or shortness of breath.  BP Readings from Last 3 Encounters:  10/27/19 (!) 148/77  10/13/19 134/86  09/28/19 137/77     2. Type 2 diabetes mellitus without complication, without long-term current use of insulin (HCC) Zahlia's A1c was 6.4 in December 2020 at her primary care physician's office. She is metformin 850 mg qd and DM is well controlled. She rarely checks her blood sugars.  Assessment/Plan:   1. Essential hypertension Ilsa is working on healthy weight loss and exercise to improve blood pressure control. We will refill losartan for 1 month, and will continue to monitor. Her dose may need to be increased if high BPs continue.  - losartan (COZAAR) 50 MG tablet; Take 1 tablet (50 mg total) by mouth daily.  Dispense: 90 tablet; Refill: 0  2. Type 2 diabetes mellitus without complication, without long-term current use of insulin (HCC) Good blood sugar control is important to decrease the likelihood of diabetic complications such as nephropathy, neuropathy, limb loss,  blindness, coronary artery disease, and death. Intensive lifestyle modification including diet, exercise and weight loss are the first line of treatment for diabetes. Raveen agreed to continue taking metformin, and will continue to monitor.  3. Class 2 severe obesity with serious comorbidity and body mass index (BMI) of 38.0 to 38.9 in adult, unspecified obesity type (HCC) Moya is currently in the action stage of change. As such, her goal is to continue with weight loss efforts. She has agreed to the Category 1 Plan.   Skyanne will switch to wraps. She is to have wraps with 2 oz of meat at lunch and have another one in mid-afternoon. She is to not buy more bread for the plan since she struggles with eating too much of it. She agrees with this solution.  Exercise goals: Annayah will walk when weather permits.  Behavioral modification strategies: decreasing simple carbohydrates.  Garnell has agreed to follow-up with our clinic in 2 weeks. She was informed of the importance of frequent follow-up visits to maximize her success with intensive lifestyle modifications for her multiple health conditions.   Objective:   Blood pressure (!) 148/77, pulse 63, temperature 98 F (36.7 C), temperature source Oral, height 5\' 2"  (1.575 m), weight 211 lb (95.7 kg), SpO2 100 %. Body mass index is 38.59 kg/m.  General: Cooperative, alert, well developed, in no acute distress. HEENT: Conjunctivae and lids unremarkable. Cardiovascular: Regular rhythm.  Lungs: Normal work of breathing. Neurologic: No focal deficits.   Lab Results  Component Value Date   CREATININE 0.88 11/27/2018  BUN 19 11/27/2018   NA 141 11/27/2018   K 4.7 11/27/2018   CL 100 11/27/2018   CO2 25 11/27/2018   Lab Results  Component Value Date   ALT 11 11/27/2018   AST 15 11/27/2018   ALKPHOS 79 11/27/2018   BILITOT 0.6 11/27/2018   Lab Results  Component Value Date   HGBA1C 6.1 (H) 11/27/2018   HGBA1C 6.3 06/13/2018   HGBA1C  7.4 (H) 03/10/2018   Lab Results  Component Value Date   INSULIN 9.1 11/27/2018   INSULIN 15.0 03/10/2018   Lab Results  Component Value Date   TSH 1.300 03/10/2018   Lab Results  Component Value Date   CHOL 149 03/10/2018   HDL 42 03/10/2018   LDLCALC 82 03/10/2018   TRIG 125 03/10/2018   Lab Results  Component Value Date   WBC 6.5 03/10/2018   HGB 12.1 03/10/2018   HCT 37.6 03/10/2018   MCV 84 03/10/2018   PLT 208 09/24/2007   No results found for: IRON, TIBC, FERRITIN  Obesity Behavioral Intervention Documentation for Insurance:   Approximately 15 minutes were spent on the discussion below.  ASK: We discussed the diagnosis of obesity with [name] today and Tatia agreed to give [name] permission to discuss obesity behavioral modification therapy today.  ASSESS: Gibson has the diagnosis of obesity and her BMI today is 38.58. Valory is in the action stage of change.   ADVISE: Tylyn was educated on the multiple health risks of obesity as well as the benefit of weight loss to improve her health. She was advised of the need for long term treatment and the importance of lifestyle modifications to improve her current health and to decrease her risk of future health problems.  AGREE: Multiple dietary modification options and treatment options were discussed and Katriona agreed to follow the recommendations documented in the above note.  ARRANGE: Cozy was educated on the importance of frequent visits to treat obesity as outlined per CMS and USPSTF guidelines and agreed to schedule her next follow up appointment today.  Attestation Statements:   Reviewed by clinician on day of visit: allergies, medications, problem list, medical history, surgical history, family history, social history, and previous encounter notes.   Wilhemena Durie, am acting as Location manager for [employer], FNP-C.  I have reviewed the above documentation for accuracy and completeness, and I agree  with the above. -  Georgianne Fick, FNP

## 2019-10-28 ENCOUNTER — Encounter (INDEPENDENT_AMBULATORY_CARE_PROVIDER_SITE_OTHER): Payer: Self-pay | Admitting: Family Medicine

## 2019-11-11 ENCOUNTER — Ambulatory Visit (INDEPENDENT_AMBULATORY_CARE_PROVIDER_SITE_OTHER): Payer: PPO | Admitting: Family Medicine

## 2019-11-11 ENCOUNTER — Encounter (INDEPENDENT_AMBULATORY_CARE_PROVIDER_SITE_OTHER): Payer: Self-pay | Admitting: Family Medicine

## 2019-11-11 ENCOUNTER — Other Ambulatory Visit: Payer: Self-pay

## 2019-11-11 VITALS — BP 136/82 | HR 55 | Temp 98.0°F | Ht 62.0 in | Wt 213.0 lb

## 2019-11-11 DIAGNOSIS — F3289 Other specified depressive episodes: Secondary | ICD-10-CM | POA: Diagnosis not present

## 2019-11-11 DIAGNOSIS — Z6839 Body mass index (BMI) 39.0-39.9, adult: Secondary | ICD-10-CM

## 2019-11-11 NOTE — Progress Notes (Signed)
Chief Complaint:   OBESITY Samantha Lozano is here to discuss her progress with her obesity treatment plan along with follow-up of her obesity related diagnoses. Samantha Lozano is on the Category 1 Plan and states she is following her eating plan approximately 60% of the time. Samantha Lozano states she is walking 60 minutes 6-7 times per week.  Today's visit was #: 60  Starting weight: 237 lbs Starting date: 03/10/2018 Today's weight: 213 lbs Today's date: 11/11/2019 Total lbs lost to date: 24 Total lbs lost since last in-office visit: 0  Interim History: Samantha Lozano is struggling with significant emotional eating. She feels this is boredom. She is quite discouraged and frustrated because she is slowly gaining weight back that she had lost. She wants to get out and work in her garden as the weather improves.  Subjective:   Other depression, emotional eating. Samantha Lozano is struggling with emotional eating and using food for comfort to the extent that it is negatively impacting her health. She has been working on behavior modification techniques to help reduce her emotional eating and has been minimally successful. She shows no sign of suicidal or homicidal ideations. Samantha Lozano reports excessive snacking on anything she can find in her house to eat.She feels this is related to boredom. She is not buying snack foods. We discussed finding activities at home to do.  Assessment/Plan:   Other depression, emotional eating. Behavior modification techniques were discussed today to help Samantha Lozano deal with her emotional/non-hunger eating behaviors.  Orders and follow up as documented in patient record. We discussed Samantha Lozano and bupropion. She will consider these options.  Class 2 severe obesity with serious comorbidity and body mass index (BMI) of 39.0 to 39.9 in adult, unspecified obesity type (Samantha Lozano).  Samantha Lozano is currently in the action stage of change. As such, her goal is to continue with weight loss efforts. She has  agreed to the Category 1 Plan + 100 calories.   She will snack on raw vegetables rather than carbs.  Exercise goals: Samantha Lozano will continue her current exercise regimen.  Behavioral modification strategies: decreasing simple carbohydrates, better snacking choices and emotional eating strategies.  Samantha Lozano has agreed to follow-up with our clinic in 2 weeks. She was informed of the importance of frequent follow-up visits to maximize her success with intensive lifestyle modifications for her multiple health conditions.   Objective:   Blood pressure 136/82, pulse (!) 55, temperature 98 F (36.7 C), height 5\' 2"  (1.575 m), weight 213 lb (96.6 kg), SpO2 (!) 9 %. Body mass index is 38.96 kg/m.  General: Cooperative, alert, well developed, in no acute distress. HEENT: Conjunctivae and lids unremarkable. Cardiovascular: Regular rhythm.  Lungs: Normal work of breathing. Neurologic: No focal deficits.   Lab Results  Component Value Date   CREATININE 0.88 11/27/2018   BUN 19 11/27/2018   NA 141 11/27/2018   K 4.7 11/27/2018   CL 100 11/27/2018   CO2 25 11/27/2018   Lab Results  Component Value Date   ALT 11 11/27/2018   AST 15 11/27/2018   ALKPHOS 79 11/27/2018   BILITOT 0.6 11/27/2018   Lab Results  Component Value Date   HGBA1C 6.1 (H) 11/27/2018   HGBA1C 6.3 06/13/2018   HGBA1C 7.4 (H) 03/10/2018   Lab Results  Component Value Date   INSULIN 9.1 11/27/2018   INSULIN 15.0 03/10/2018   Lab Results  Component Value Date   TSH 1.300 03/10/2018   Lab Results  Component Value Date   CHOL  149 03/10/2018   HDL 42 03/10/2018   LDLCALC 82 03/10/2018   TRIG 125 03/10/2018   Lab Results  Component Value Date   WBC 6.5 03/10/2018   HGB 12.1 03/10/2018   HCT 37.6 03/10/2018   MCV 84 03/10/2018   PLT 208 09/24/2007   No results found for: IRON, TIBC, FERRITIN  Obesity Behavioral Intervention Documentation for Insurance:   Approximately 15 minutes were spent on the  discussion below.  ASK: We discussed the diagnosis of obesity with [name] today and Micheal agreed to give [name] permission to discuss obesity behavioral modification therapy today.  ASSESS: Modesta has the diagnosis of obesity and her BMI today is 39.0. Sofiah is in the action stage of change.   ADVISE: Kadeisha was educated on the multiple health risks of obesity as well as the benefit of weight loss to improve her health. She was advised of the need for long term treatment and the importance of lifestyle modifications to improve her current health and to decrease her risk of future health problems.  AGREE: Multiple dietary modification options and treatment options were discussed and Raziya agreed to follow the recommendations documented in the above note.  ARRANGE: Wynonna was educated on the importance of frequent visits to treat obesity as outlined per CMS and USPSTF guidelines and agreed to schedule her next follow up appointment today.  Attestation Statements:   Reviewed by clinician on day of visit: allergies, medications, problem list, medical history, surgical history, family history, social history, and previous encounter notes.  IMichaelene Song, am acting as Location manager for [employer], FNP   I have reviewed the above documentation for accuracy and completeness, and I agree with the above. -  Georgianne Fick, FNP

## 2019-11-12 ENCOUNTER — Encounter (INDEPENDENT_AMBULATORY_CARE_PROVIDER_SITE_OTHER): Payer: Self-pay | Admitting: Family Medicine

## 2019-11-26 ENCOUNTER — Ambulatory Visit (INDEPENDENT_AMBULATORY_CARE_PROVIDER_SITE_OTHER): Payer: PPO | Admitting: Family Medicine

## 2019-11-26 ENCOUNTER — Encounter (INDEPENDENT_AMBULATORY_CARE_PROVIDER_SITE_OTHER): Payer: Self-pay | Admitting: Family Medicine

## 2019-11-26 ENCOUNTER — Other Ambulatory Visit: Payer: Self-pay

## 2019-11-26 VITALS — BP 165/84 | HR 63 | Temp 98.1°F | Ht 62.0 in | Wt 213.0 lb

## 2019-11-26 DIAGNOSIS — I10 Essential (primary) hypertension: Secondary | ICD-10-CM

## 2019-11-26 DIAGNOSIS — M25562 Pain in left knee: Secondary | ICD-10-CM | POA: Diagnosis not present

## 2019-11-26 DIAGNOSIS — G8929 Other chronic pain: Secondary | ICD-10-CM

## 2019-11-26 DIAGNOSIS — Z6839 Body mass index (BMI) 39.0-39.9, adult: Secondary | ICD-10-CM | POA: Diagnosis not present

## 2019-11-26 DIAGNOSIS — M25561 Pain in right knee: Secondary | ICD-10-CM | POA: Insufficient documentation

## 2019-11-26 NOTE — Progress Notes (Signed)
Chief Complaint:   OBESITY Samantha Lozano is here to discuss her progress with her obesity treatment plan along with follow-up of her obesity related diagnoses. Samantha Lozano is on the Category 1 Plan and states she is following her eating plan approximately 30% of the time. Samantha Lozano states she is walking 60 minutes 5-6 times per week.  Today's visit was #: 53 Starting weight: 237 lbs Starting date: 03/10/2018 Today's weight: 213 lbs Today's date: 11/26/2019 Total lbs lost to date: 24 Total lbs lost since last in-office visit: 0  Interim History: Samantha Lozano is making healthier snack choices (raw vegetables). She is working on staying busier rather than snacking. She reports eating all of the food on the plan and notes she needs to drink more water.  Subjective:   Essential hypertension. Blood pressure is high today, but is usually well controlled. She reports being compliant with all of her medications. No chest pain or shortness of breath.  BP Readings from Last 3 Encounters:  11/26/19 (!) 165/84  11/11/19 136/82  10/27/19 (!) 148/77   Lab Results  Component Value Date   CREATININE 0.88 11/27/2018   CREATININE 0.74 07/24/2018   CREATININE 0.79 04/28/2018   Pain in both knees, unspecified chronicity. Samantha Lozano reports having worsening bilateral knee pain. She reports she has been diagnosed with osteoarthritis in the past.   Assessment/Plan:   Essential hypertension. Samantha Lozano is working on healthy weight loss and exercise to improve blood pressure control. She will continue all meds.  Will continue to monitor.  Pain in both knees, unspecified chronicity. Samantha Lozano was encouraged to call her Orthopedic doctor for evaluation.  Class 2 severe obesity with serious comorbidity and body mass index (BMI) of 39.0 to 39.9 in adult, unspecified obesity type (Samantha Lozano).  Samantha Lozano is currently in the action stage of change. As such, her goal is to continue with weight loss efforts. She has agreed to the  Category 1 Plan.   Exercise goals: Samantha Lozano will continue her current exercise regimen.  Behavioral modification strategies: decreasing simple carbohydrates and increasing water intake.  Samantha Lozano has agreed to follow-up with our clinic in 2 weeks. She was informed of the importance of frequent follow-up visits to maximize her success with intensive lifestyle modifications for her multiple health conditions.   Objective:   Blood pressure (!) 165/84, pulse 63, temperature 98.1 F (36.7 C), temperature source Oral, height 5\' 2"  (1.575 m), weight 213 lb (96.6 kg), SpO2 95 %. Body mass index is 38.96 kg/m.  General: Cooperative, alert, well developed, in no acute distress. HEENT: Conjunctivae and lids unremarkable. Cardiovascular: Regular rhythm.  Lungs: Normal work of breathing. Neurologic: No focal deficits.   Lab Results  Component Value Date   CREATININE 0.88 11/27/2018   BUN 19 11/27/2018   NA 141 11/27/2018   K 4.7 11/27/2018   CL 100 11/27/2018   CO2 25 11/27/2018   Lab Results  Component Value Date   ALT 11 11/27/2018   AST 15 11/27/2018   ALKPHOS 79 11/27/2018   BILITOT 0.6 11/27/2018   Lab Results  Component Value Date   HGBA1C 6.1 (H) 11/27/2018   HGBA1C 6.3 06/13/2018   HGBA1C 7.4 (H) 03/10/2018   Lab Results  Component Value Date   INSULIN 9.1 11/27/2018   INSULIN 15.0 03/10/2018   Lab Results  Component Value Date   TSH 1.300 03/10/2018   Lab Results  Component Value Date   CHOL 149 03/10/2018   HDL 42 03/10/2018   LDLCALC 82  03/10/2018   TRIG 125 03/10/2018   Lab Results  Component Value Date   WBC 6.5 03/10/2018   HGB 12.1 03/10/2018   HCT 37.6 03/10/2018   MCV 84 03/10/2018   PLT 208 09/24/2007   No results found for: IRON, TIBC, FERRITIN  Obesity Behavioral Intervention Documentation for Insurance:   Approximately 15 minutes were spent on the discussion below.  ASK: We discussed the diagnosis of obesity with Samantha Lozano today and Samantha Lozano  agreed to give [name] permission to discuss obesity behavioral modification therapy today.  ASSESS: Samantha Lozano has the diagnosis of obesity and her BMI today is 39.0. Samantha Lozano is in the action stage of change.   ADVISE: Samantha Lozano was educated on the multiple health risks of obesity as well as the benefit of weight loss to improve her health. She was advised of the need for long term treatment and the importance of lifestyle modifications to improve her current health and to decrease her risk of future health problems.  AGREE: Multiple dietary modification options and treatment options were discussed and Samantha Lozano agreed to follow the recommendations documented in the above note.  ARRANGE: Samantha Lozano was educated on the importance of frequent visits to treat obesity as outlined per CMS and USPSTF guidelines and agreed to schedule her next follow up appointment today.  Attestation Statements:   Reviewed by clinician on day of visit: allergies, medications, problem list, medical history, surgical history, family history, social history, and previous encounter notes.  IMichaelene Song, am acting as Location manager for [employer], FNP   I have reviewed the above documentation for accuracy and completeness, and I agree with the above. - Georgianne Fick, FNP

## 2019-12-14 ENCOUNTER — Other Ambulatory Visit: Payer: Self-pay

## 2019-12-14 ENCOUNTER — Ambulatory Visit (INDEPENDENT_AMBULATORY_CARE_PROVIDER_SITE_OTHER): Payer: PPO | Admitting: Family Medicine

## 2019-12-14 ENCOUNTER — Encounter (INDEPENDENT_AMBULATORY_CARE_PROVIDER_SITE_OTHER): Payer: Self-pay | Admitting: Family Medicine

## 2019-12-14 VITALS — BP 138/79 | HR 68 | Temp 98.2°F | Ht 62.0 in | Wt 219.0 lb

## 2019-12-14 DIAGNOSIS — F3289 Other specified depressive episodes: Secondary | ICD-10-CM

## 2019-12-14 DIAGNOSIS — M170 Bilateral primary osteoarthritis of knee: Secondary | ICD-10-CM | POA: Insufficient documentation

## 2019-12-14 DIAGNOSIS — Z6841 Body Mass Index (BMI) 40.0 and over, adult: Secondary | ICD-10-CM | POA: Diagnosis not present

## 2019-12-14 NOTE — Progress Notes (Signed)
Chief Complaint:   OBESITY Samantha Lozano is here to discuss her progress with her obesity treatment plan along with follow-up of her obesity related diagnoses. Samantha Lozano is on the Category 1 Plan and states she is following her eating plan approximately 5% of the time. Samantha Lozano states she is walking for 45 minutes 6 times per week.  Today's visit was #: 46 Starting weight: 237 lbs Starting date: 03/10/2018 Today's weight: 219 lbs Today's date: 12/14/2019 Total lbs lost to date: 18 Total lbs lost since last in-office visit: 0  Interim History: Samantha Lozano reports Easter candy has gotten her off track. Her friends brought her Easter candy and she ate it. Easter candy is now out of the house. She made a pact with her friend who attends our clinic to stick to the plan. They also walk together. She is eating all of her protein and meals on the plan, but then goes over on extra calories in the afternoon. She reports she does not eat after supper.  Subjective:   1. Osteoarthritis of both knees, unspecified osteoarthritis type She was diagnosed with bilateral knee OA 5 years ago by Dr. Alphonzo Severance. She has not followed up with him since then. Samantha Lozano is using Tylenol for the pain which does help. Her pain is 10/10 at times. SAhe applies ice to her knees sometimes. She walks despite the pain but is worried she eventually won't be able to be as mobile as she would like.   2. Other depression, emotional eating Samantha Lozano reports eating  due to boredom.  Assessment/Plan:   1. Osteoarthritis of both knees, unspecified osteoarthritis type Samantha Lozano is to make an appointment to see Orthopedic MD. She sees Dr. Alphonzo Severance. She is to apply ice after walking for 20 minutes. Continue Tylenol for pain.  2. Other depression, emotional eating Behavior modification techniques were discussed today to help Samantha Lozano deal with her emotional/non-hunger eating behaviors. Samantha Lozano declined bupropion. Orders and follow up as documented in  patient record.   3. Class 3 severe obesity with serious comorbidity and body mass index (BMI) of 40.0 to 44.9 in adult, unspecified obesity type ([facility]) Samantha Lozano is currently in the action stage of change. As such, her goal is to continue with weight loss efforts. She has agreed to the Category 1 Plan.   Exercise goals: As is.  Behavioral modification strategies: decreasing simple carbohydrates and better snacking choices.  Samantha Lozano has agreed to follow-up with our clinic in 2 weeks. She was informed of the importance of frequent follow-up visits to maximize her success with intensive lifestyle modifications for her multiple health conditions.   Objective:   Blood pressure 138/79, pulse 68, temperature 98.2 F (36.8 C), temperature source Oral, height 5\' 2"  (1.575 m), weight 219 lb (99.3 kg), SpO2 99 %. Body mass index is 40.06 kg/m.  General: Cooperative, alert, well developed, in no acute distress. HEENT: Conjunctivae and lids unremarkable. Cardiovascular: Regular rhythm.  Lungs: Normal work of breathing. Neurologic: No focal deficits.   Lab Results  Component Value Date   CREATININE 0.88 11/27/2018   BUN 19 11/27/2018   NA 141 11/27/2018   K 4.7 11/27/2018   CL 100 11/27/2018   CO2 25 11/27/2018   Lab Results  Component Value Date   ALT 11 11/27/2018   AST 15 11/27/2018   ALKPHOS 79 11/27/2018   BILITOT 0.6 11/27/2018   Lab Results  Component Value Date   HGBA1C 6.1 (H) 11/27/2018   HGBA1C 6.3 06/13/2018   HGBA1C  7.4 (H) 03/10/2018   Lab Results  Component Value Date   INSULIN 9.1 11/27/2018   INSULIN 15.0 03/10/2018   Lab Results  Component Value Date   TSH 1.300 03/10/2018   Lab Results  Component Value Date   CHOL 149 03/10/2018   HDL 42 03/10/2018   LDLCALC 82 03/10/2018   TRIG 125 03/10/2018   Lab Results  Component Value Date   WBC 6.5 03/10/2018   HGB 12.1 03/10/2018   HCT 37.6 03/10/2018   MCV 84 03/10/2018   PLT 208 09/24/2007   No results  found for: IRON, TIBC, FERRITIN  Obesity Behavioral Intervention Documentation for Insurance:   Approximately 15 minutes were spent on the discussion below.  ASK: We discussed the diagnosis of obesity with Samantha Lozano today and Samantha Lozano agreed to give [name] permission to discuss obesity behavioral modification therapy today.  ASSESS: Samantha Lozano has the diagnosis of obesity and her BMI today is 40.05. Samantha Lozano is in the action stage of change.   ADVISE: Samantha Lozano was educated on the multiple health risks of obesity as well as the benefit of weight loss to improve her health. She was advised of the need for long term treatment and the importance of lifestyle modifications to improve her current health and to decrease her risk of future health problems.  AGREE: Multiple dietary modification options and treatment options were discussed and Samantha Lozano agreed to follow the recommendations documented in the above note.  ARRANGE: Samantha Lozano was educated on the importance of frequent visits to treat obesity as outlined per CMS and USPSTF guidelines and agreed to schedule her next follow up appointment today.  Attestation Statements:   Reviewed by clinician on day of visit: allergies, medications, problem list, medical history, surgical history, family history, social history, and previous encounter notes.   Samantha Lozano, am acting as Location manager for [employer], FNP-C.  I have reviewed the above documentation for accuracy and completeness, and I agree with the above. -  Georgianne Fick, FNP

## 2019-12-18 DIAGNOSIS — C44622 Squamous cell carcinoma of skin of right upper limb, including shoulder: Secondary | ICD-10-CM | POA: Diagnosis not present

## 2019-12-18 DIAGNOSIS — L570 Actinic keratosis: Secondary | ICD-10-CM | POA: Diagnosis not present

## 2019-12-18 DIAGNOSIS — Z85828 Personal history of other malignant neoplasm of skin: Secondary | ICD-10-CM | POA: Diagnosis not present

## 2019-12-18 DIAGNOSIS — D0461 Carcinoma in situ of skin of right upper limb, including shoulder: Secondary | ICD-10-CM | POA: Diagnosis not present

## 2019-12-29 ENCOUNTER — Ambulatory Visit (INDEPENDENT_AMBULATORY_CARE_PROVIDER_SITE_OTHER): Payer: PPO | Admitting: Family Medicine

## 2019-12-30 ENCOUNTER — Encounter (INDEPENDENT_AMBULATORY_CARE_PROVIDER_SITE_OTHER): Payer: Self-pay | Admitting: Family Medicine

## 2019-12-30 ENCOUNTER — Other Ambulatory Visit: Payer: Self-pay

## 2019-12-30 ENCOUNTER — Ambulatory Visit (INDEPENDENT_AMBULATORY_CARE_PROVIDER_SITE_OTHER): Payer: PPO | Admitting: Family Medicine

## 2019-12-30 VITALS — BP 144/84 | HR 56 | Temp 97.9°F | Ht 62.0 in | Wt 215.0 lb

## 2019-12-30 DIAGNOSIS — E1169 Type 2 diabetes mellitus with other specified complication: Secondary | ICD-10-CM

## 2019-12-30 DIAGNOSIS — E559 Vitamin D deficiency, unspecified: Secondary | ICD-10-CM

## 2019-12-30 DIAGNOSIS — E119 Type 2 diabetes mellitus without complications: Secondary | ICD-10-CM

## 2019-12-30 DIAGNOSIS — Z6839 Body mass index (BMI) 39.0-39.9, adult: Secondary | ICD-10-CM

## 2020-01-04 ENCOUNTER — Encounter (INDEPENDENT_AMBULATORY_CARE_PROVIDER_SITE_OTHER): Payer: Self-pay | Admitting: Family Medicine

## 2020-01-04 NOTE — Progress Notes (Signed)
Chief Complaint:   OBESITY Samantha Lozano is here to discuss her progress with her obesity treatment plan along with follow-up of her obesity related diagnoses. Samantha Lozano is on the Category 1 Plan and states she is following her eating plan approximately 20% of the time. Samantha Lozano states she is walking 45-60 minutes 6-7 times per week.  Today's visit was #: 59 Starting weight: 237 lbs Starting date: 03/10/2018 Today's weight: 215 lbs Today's date: 12/30/2019 Total lbs lost to date: 22 Total lbs lost since last in-office visit: 4  Interim History: Samantha Lozano is very happy with her weight loss. She is snacking less in the afternoon partially due to walking in the afternoon. She reports her knees are not as painful as they were due to better weather. She reports getting her protein in.  Subjective:   Type 2 diabetes mellitus without complication, without long-term current use of insulin ([place]). Samantha Lozano reports occasional polyphagia. Last A1c was 6.4 in December at her PCP's office. She will have PCP appointment in July for recheck of A1c.  Lab Results  Component Value Date   HGBA1C 6.1 (H) 11/27/2018   HGBA1C 6.3 06/13/2018   HGBA1C 7.4 (H) 03/10/2018   Lab Results  Component Value Date   LDLCALC 82 03/10/2018   CREATININE 0.88 11/27/2018   Lab Results  Component Value Date   INSULIN 9.1 11/27/2018   INSULIN 15.0 03/10/2018   Vitamin D deficiency. Samantha Lozano is on Vitamin D 50,000 units every 14 days. Last Vitamin D at goal - 47.3 on 05/14/2019.  Assessment/Plan:   Type 2 diabetes mellitus without complication, without long-term current use of insulin ([place]).   Intensive lifestyle modification including diet, exercise and weight loss are the first line of treatment for diabetes. Samantha Lozano will continue metformin as directed.  Vitamin D deficiency. Low Vitamin D level contributes to fatigue and are associated with obesity, breast, and colon cancer. She agrees to continue to take  prescription Vitamin D @50 ,000 IU every 14 days and will have PCP check her Vitamin D level in July.  Class 2 severe obesity with serious comorbidity and body mass index (BMI) of 39.0 to 39.9 in adult, unspecified obesity type ([place]).  Samantha Lozano is currently in the action stage of change. As such, her goal is to continue with weight loss efforts. She has agreed to the Category 1 Plan.   Exercise goals: Samantha Lozano will continue her current exercise regimen.  Behavioral modification strategies: decreasing simple carbohydrates and increasing water intake.  Samantha Lozano has agreed to follow-up with our clinic in 2 weeks. She was informed of the importance of frequent follow-up visits to maximize her success with intensive lifestyle modifications for her multiple health conditions.   Objective:   Blood pressure (!) 144/84, pulse (!) 56, temperature 97.9 F (36.6 C), temperature source Oral, height 5\' 2"  (1.575 m), weight 215 lb (97.5 kg), SpO2 98 %. Body mass index is 39.32 kg/m.  General: Cooperative, alert, well developed, in no acute distress. HEENT: Conjunctivae and lids unremarkable. Cardiovascular: Regular rhythm.  Lungs: Normal work of breathing. Neurologic: No focal deficits.   Lab Results  Component Value Date   CREATININE 0.88 11/27/2018   BUN 19 11/27/2018   NA 141 11/27/2018   K 4.7 11/27/2018   CL 100 11/27/2018   CO2 25 11/27/2018   Lab Results  Component Value Date   ALT 11 11/27/2018   AST 15 11/27/2018   ALKPHOS 79 11/27/2018   BILITOT 0.6 11/27/2018   Lab  Results  Component Value Date   HGBA1C 6.1 (H) 11/27/2018   HGBA1C 6.3 06/13/2018   HGBA1C 7.4 (H) 03/10/2018   Lab Results  Component Value Date   INSULIN 9.1 11/27/2018   INSULIN 15.0 03/10/2018   Lab Results  Component Value Date   TSH 1.300 03/10/2018   Lab Results  Component Value Date   CHOL 149 03/10/2018   HDL 42 03/10/2018   LDLCALC 82 03/10/2018   TRIG 125 03/10/2018   Lab Results  Component  Value Date   WBC 6.5 03/10/2018   HGB 12.1 03/10/2018   HCT 37.6 03/10/2018   MCV 84 03/10/2018   PLT 208 09/24/2007   No results found for: IRON, TIBC, FERRITIN  Obesity Behavioral Intervention Documentation for Insurance:   Approximately 15 minutes were spent on the discussion below.  ASK: We discussed the diagnosis of obesity with Samantha Lozano today and Samantha Lozano agreed to give [name] permission to discuss obesity behavioral modification therapy today.  ASSESS: Samantha Lozano has the diagnosis of obesity and her BMI today is 39.4. Samantha Lozano is in the action stage of change.   ADVISE: Samantha Lozano was educated on the multiple health risks of obesity as well as the benefit of weight loss to improve her health. She was advised of the need for long term treatment and the importance of lifestyle modifications to improve her current health and to decrease her risk of future health problems.  AGREE: Multiple dietary modification options and treatment options were discussed and Samantha Lozano agreed to follow the recommendations documented in the above note.  ARRANGE: Samantha Lozano was educated on the importance of frequent visits to treat obesity as outlined per CMS and USPSTF guidelines and agreed to schedule her next follow up appointment today.  Attestation Statements:   Reviewed by clinician on day of visit: allergies, medications, problem list, medical history, surgical history, family history, social history, and previous encounter notes.  Samantha Lozano, am acting as Location manager for [employer], FNP   I have reviewed the above documentation for accuracy and completeness, and I agree with the above. -  Georgianne Fick, FNP

## 2020-01-10 ENCOUNTER — Encounter (HOSPITAL_COMMUNITY): Payer: Self-pay | Admitting: Emergency Medicine

## 2020-01-10 ENCOUNTER — Emergency Department (HOSPITAL_COMMUNITY): Payer: PPO

## 2020-01-10 ENCOUNTER — Other Ambulatory Visit: Payer: Self-pay

## 2020-01-10 ENCOUNTER — Emergency Department (HOSPITAL_COMMUNITY)
Admission: EM | Admit: 2020-01-10 | Discharge: 2020-01-10 | Disposition: A | Discharge status: Home / Self Care | Payer: PPO | Attending: Emergency Medicine | Admitting: Emergency Medicine

## 2020-01-10 DIAGNOSIS — Z7982 Long term (current) use of aspirin: Secondary | ICD-10-CM | POA: Insufficient documentation

## 2020-01-10 DIAGNOSIS — Z79899 Other long term (current) drug therapy: Secondary | ICD-10-CM | POA: Diagnosis not present

## 2020-01-10 DIAGNOSIS — C801 Malignant (primary) neoplasm, unspecified: Secondary | ICD-10-CM | POA: Diagnosis not present

## 2020-01-10 DIAGNOSIS — E119 Type 2 diabetes mellitus without complications: Secondary | ICD-10-CM | POA: Diagnosis not present

## 2020-01-10 DIAGNOSIS — R079 Chest pain, unspecified: Secondary | ICD-10-CM | POA: Diagnosis not present

## 2020-01-10 DIAGNOSIS — I10 Essential (primary) hypertension: Secondary | ICD-10-CM | POA: Insufficient documentation

## 2020-01-10 DIAGNOSIS — M8448XA Pathological fracture, other site, initial encounter for fracture: Secondary | ICD-10-CM | POA: Diagnosis not present

## 2020-01-10 DIAGNOSIS — R0902 Hypoxemia: Secondary | ICD-10-CM | POA: Diagnosis not present

## 2020-01-10 DIAGNOSIS — Z7984 Long term (current) use of oral hypoglycemic drugs: Secondary | ICD-10-CM | POA: Insufficient documentation

## 2020-01-10 DIAGNOSIS — R0602 Shortness of breath: Secondary | ICD-10-CM | POA: Diagnosis not present

## 2020-01-10 DIAGNOSIS — R110 Nausea: Secondary | ICD-10-CM | POA: Diagnosis not present

## 2020-01-10 DIAGNOSIS — C799 Secondary malignant neoplasm of unspecified site: Secondary | ICD-10-CM | POA: Diagnosis not present

## 2020-01-10 DIAGNOSIS — R071 Chest pain on breathing: Secondary | ICD-10-CM | POA: Diagnosis not present

## 2020-01-10 DIAGNOSIS — R0789 Other chest pain: Secondary | ICD-10-CM | POA: Diagnosis not present

## 2020-01-10 DIAGNOSIS — R918 Other nonspecific abnormal finding of lung field: Secondary | ICD-10-CM | POA: Diagnosis not present

## 2020-01-10 LAB — BASIC METABOLIC PANEL
Anion gap: 14 (ref 5–15)
BUN: 17 mg/dL (ref 8–23)
CO2: 21 mmol/L — ABNORMAL LOW (ref 22–32)
Calcium: 9 mg/dL (ref 8.9–10.3)
Chloride: 102 mmol/L (ref 98–111)
Creatinine, Ser: 0.77 mg/dL (ref 0.44–1.00)
GFR calc Af Amer: >60 mL/min (ref >60)
GFR calc non Af Amer: >60 mL/min (ref >60)
Glucose, Bld: 140 mg/dL — ABNORMAL HIGH (ref 70–99)
Potassium: 3.8 mmol/L (ref 3.5–5.1)
Sodium: 137 mmol/L (ref 135–145)

## 2020-01-10 LAB — CBC WITH DIFFERENTIAL/PLATELET
Abs Immature Granulocytes: 0.02 10*3/uL (ref 0.00–0.07)
Basophils Absolute: 0 10*3/uL (ref 0.0–0.1)
Basophils Relative: 0 %
Eosinophils Absolute: 0.3 10*3/uL (ref 0.0–0.5)
Eosinophils Relative: 4 %
HCT: 35.4 % — ABNORMAL LOW (ref 36.0–46.0)
Hemoglobin: 11.4 g/dL — ABNORMAL LOW (ref 12.0–15.0)
Immature Granulocytes: 0 %
Lymphocytes Relative: 30 %
Lymphs Abs: 2.3 10*3/uL (ref 0.7–4.0)
MCH: 27.7 pg (ref 26.0–34.0)
MCHC: 32.2 g/dL (ref 30.0–36.0)
MCV: 85.9 fL (ref 80.0–100.0)
Monocytes Absolute: 0.5 10*3/uL (ref 0.1–1.0)
Monocytes Relative: 7 %
Neutro Abs: 4.6 10*3/uL (ref 1.7–7.7)
Neutrophils Relative %: 59 %
Platelets: 203 10*3/uL (ref 150–400)
RBC: 4.12 MIL/uL (ref 3.87–5.11)
RDW: 12.6 % (ref 11.5–15.5)
WBC: 7.8 10*3/uL (ref 4.0–10.5)
nRBC: 0 % (ref 0.0–0.2)

## 2020-01-10 LAB — TROPONIN I (HIGH SENSITIVITY)
Troponin I (High Sensitivity): 10 ng/L (ref <18)
Troponin I (High Sensitivity): 18 ng/L — ABNORMAL HIGH (ref <18)

## 2020-01-10 MED ORDER — IOHEXOL 300 MG/ML  SOLN
75.0000 mL | Freq: Once | INTRAMUSCULAR | Status: AC | PRN
  Administered 2020-01-10: 19:00:00 75 mL via INTRAVENOUS

## 2020-01-10 MED ORDER — HYDROCODONE-ACETAMINOPHEN 5-325 MG PO TABS: 1.0000 | ORAL_TABLET | Freq: Four times a day (QID) | ORAL | 0 refills | Status: DC | PRN

## 2020-01-10 NOTE — ED Provider Notes (Signed)
[place] EMERGENCY DEPARTMENT Provider Note   CSN: [id number] Arrival date & time: 01/10/20  1612     History Chief Complaint  Patient presents with  . Chest Pain  . Hypertension    Samantha Lozano is a 74 y.o. female.  HPI   73yF with CP. Onset about a week ago. Intermittent sharp pain. Worse with deep breathing, certain movements. Feels better when still. Pain is near R breast. No cough. No fever or chills. No unusual leg pain or swelling.   Past Medical History:  Diagnosis Date  . Diabetes mellitus, type 2 ([place])   . HLD (hyperlipidemia)   . HTN (hypertension)   . Leg edema   . Other specified disorders of thyroid     Patient Active Problem List   Diagnosis Date Noted  . Osteoarthritis of both knees 12/14/2019  . Pain in both knees 11/26/2019  . Essential hypertension 09/16/2019  . Depression 07/06/2019  . Class 2 severe obesity with serious comorbidity and body mass index (BMI) of 39.0 to 39.9 in adult ([place]) 09/25/2018  . Class 2 severe obesity with serious comorbidity and body mass index (BMI) of 36.0 to 36.9 in adult [facility]) 07/30/2018  . Other fatigue 03/10/2018  . Shortness of breath on exertion 03/10/2018  . Type 2 diabetes mellitus without complication, without long-term current use of insulin ([place]) 03/10/2018  . Abnormal EKG 03/10/2018  . Vitamin D deficiency 03/10/2018  . B12 nutritional deficiency 03/10/2018    Past Surgical History:  Procedure Laterality Date  . ABDOMINAL HYSTERECTOMY    . THYROID SURGERY       OB History    Gravida  1   Para      Term      Preterm      AB      Living  1     SAB      TAB      Ectopic      Multiple      Live Births              Family History  Problem Relation Age of Onset  . Diabetes Mother   . Hypertension Mother   . Cancer Mother   . Obesity Mother   . Hypertension Father   . Cancer Father   . Obesity Father     Social History   Tobacco Use  . Smoking  status: Never Smoker  . Smokeless tobacco: Never Used  Substance Use Topics  . Alcohol use: Not Currently  . Drug use: Not Currently    Home Medications Prior to Admission medications   Medication Sig Start Date End Date Taking? Authorizing Provider  aspirin EC 81 MG tablet Take 81 mg by mouth daily.   Yes [provider]  carvedilol (COREG) 25 MG tablet Take 25 mg by mouth 2 (two) times daily with a meal.   Yes [provider]  hydrochlorothiazide (HYDRODIURIL) 25 MG tablet Take 25 mg by mouth daily.   Yes [provider]  levothyroxine (SYNTHROID, LEVOTHROID) 125 MCG tablet Take 125 mcg by mouth daily before breakfast.   Yes [provider]  losartan (COZAAR) 50 MG tablet Take 1 tablet (50 mg total) by mouth daily. 10/27/19  Yes Whitmire, Dawn W, FNP  lovastatin (MEVACOR) 40 MG tablet Take 40 mg by mouth at bedtime.   Yes [provider]  metFORMIN (GLUCOPHAGE) 850 MG tablet Take 1 tablet (850 mg total) by mouth daily. with food  08/17/19  Yes Briscoe Deutscher, DO  Omega-3 Fatty Acids (FISH OIL) 435 MG CAPS Take 435 mg by mouth daily at 2 PM.    Yes [provider]  Vitamin D, Ergocalciferol, (DRISDOL) 1.25 MG (50000 UNIT) CAPS capsule Take 1 capsule (50,000 Units total) by mouth every 14 (fourteen) days. 10/13/19  Yes Whitmire, Dawn W, FNP  glucose blood test strip Check BS twice daily 04/30/19   Jearld Lesch A, DO    Allergies    Lisinopril  Review of Systems   Review of Systems All systems reviewed and negative, other than as noted in HPI.  Physical Exam Updated Vital Signs BP 136/65   Pulse 69   Temp 98.2 F (36.8 C) (Oral)   Resp 18   Ht 5\' 2"  (1.575 m)   Wt 97.5 kg   SpO2 95%   BMI 39.31 kg/m   Physical Exam Vitals and nursing note reviewed.  Constitutional:      General: She is not in acute distress.    Appearance: She is well-developed.  HENT:     Head: Normocephalic and atraumatic.  Eyes:     General:         Right eye: No discharge.        Left eye: No discharge.     Conjunctiva/sclera: Conjunctivae normal.  Cardiovascular:     Rate and Rhythm: Normal rate and regular rhythm.     Heart sounds: Normal heart sounds. No murmur. No friction rub. No gallop.   Pulmonary:     Effort: Pulmonary effort is normal. No respiratory distress.     Breath sounds: Normal breath sounds.     Comments: TTP R anterolateral chest Chest:     Chest wall: Tenderness present.  Abdominal:     General: There is no distension.     Palpations: Abdomen is soft.     Tenderness: There is no abdominal tenderness.  Musculoskeletal:        General: No tenderness.     Cervical back: Neck supple.  Skin:    General: Skin is warm and dry.  Neurological:     Mental Status: She is alert.  Psychiatric:        Behavior: Behavior normal.        Thought Content: Thought content normal.     ED Results / Procedures / Treatments   Labs (all labs ordered are listed, but only abnormal results are displayed) Labs Reviewed  CBC WITH DIFFERENTIAL/PLATELET - Abnormal; Notable for the following components:      Result Value   Hemoglobin 11.4 (*)    HCT 35.4 (*)    All other components within normal limits  BASIC METABOLIC PANEL - Abnormal; Notable for the following components:   CO2 21 (*)    Glucose, Bld 140 (*)    All other components within normal limits  TROPONIN I (HIGH SENSITIVITY) - Abnormal; Notable for the following components:   Troponin I (High Sensitivity) 18 (*)    All other components within normal limits  TROPONIN I (HIGH SENSITIVITY)    EKG EKG Interpretation  Date/Time:  Sunday Jan 10 2020 16:24:21 EDT Ventricular Rate:  61 PR Interval:    QRS Duration: 113 QT Interval:  412 QTC Calculation: 415 R Axis:   -53 Text Interpretation: Sinus rhythm Incomplete left bundle branch block Left ventricular hypertrophy Confirmed by Virgel Manifold [phone]) on 01/10/2020 4:29:25 PM   Radiology CT Chest W  Contrast  Result Date: 01/10/2020 CLINICAL DATA:  10/10  chest pain that started 1 week ago. Pain in the right breast. EXAM: CT CHEST WITH CONTRAST TECHNIQUE: Multidetector CT imaging of the chest was performed during intravenous contrast administration. CONTRAST:  46mL OMNIPAQUE IOHEXOL 300 MG/ML  SOLN COMPARISON:  None. FINDINGS: Cardiovascular: The heart size is mildly enlarged. There is no evidence for thoracic aortic aneurysm or dissection. There is no large centrally located pulmonary embolism. Mediastinum/Nodes: --there are few mildly enlarged mediastinal and hilar lymph nodes. --No axillary lymphadenopathy. --No supraclavicular lymphadenopathy. --patient appears to be status post right hemithyroidectomy. --The esophagus is unremarkable Lungs/Pleura: There are innumerable bilateral pulmonary nodules. The dominant pulmonary nodule in the left is located in the superior segment of the left lower lobe measures approximately 2.5 cm (axial series 4, image 53). The dominant pulmonary nodule in the right is located in the right lower lobe and measures approximately 1.9 cm (axial series 4, image 88). There is no pneumothorax. There is no significant pleural effusion. Upper Abdomen: The spleen appears to be at least borderline enlarged there is no acute abnormality in the upper abdomen. Musculoskeletal: There is a partially visualized lytic lesion involving the posterior elements of the T12 vertebral body (axial series 3, image 145). There are probable additional lytic lesions involving multiple ribs bilaterally. There is a questionable lytic lesion involving the right humeral head.There is a probable nondisplaced pathologic fracture involving the right lateral fifth rib (axial series 4, image 80). IMPRESSION: 1. Innumerable bilateral pulmonary nodules concerning for metastatic disease as detailed above. 2. There are multiple lytic lesions involving the bilateral ribs, proximal right humerus, and thoracic spine  concerning for osseous metastatic disease. There is a probable nondisplaced pathologic fracture involving the right lateral fifth rib. 3. No acute cardiopulmonary process. No evidence for thoracic aortic dissection or aneurysm. No evidence for large centrally located pulmonary embolism. 4. Probable borderline splenomegaly involving the partially visualized spleen. No acute abnormality identified within the upper abdomen. Electronically Signed   By: Constance Holster M.D.   On: 01/10/2020 19:50   DG Chest Portable 1 View  Result Date: 01/10/2020 CLINICAL DATA:  Chest pain and shortness of breath, history type II diabetes mellitus, hypertension EXAM: PORTABLE CHEST 1 VIEW COMPARISON:  Portable exam 1708 hours compared to 12/17/2017 FINDINGS: Upper normal heart size. Mediastinal contours and pulmonary vascularity normal. Atherosclerotic calcification aorta. LEFT mid lung nodular density question pulmonary nodule 24 x 18 mm. RIGHT upper lobe nodule 18 x 13 mm. Bibasilar atelectasis versus infiltrate. Question additional nodular densities at the LEFT base measuring 11 mm and 8 mm in size and in RIGHT upper lobe 8 mm diameter. No pleural effusion or pneumothorax. Osseous structures unremarkable. IMPRESSION: Bibasilar atelectasis versus infiltrate. Multiple nodular foci in both lungs concerning for pulmonary nodules/metastases; CT chest with contrast recommended for further evaluation. Findings called to Dr. Wilson Singer on 01/10/2020 at 1725 hours. Electronically Signed   By: Lavonia Dana M.D.   On: 01/10/2020 17:26    Procedures Procedures (including critical care time)  Medications Ordered in ED Medications  iohexol (OMNIPAQUE) 300 MG/ML solution 75 mL (75 mLs Intravenous Contrast Given 01/10/20 1917)    ED Course  I have reviewed the triage vital signs and the nursing notes.  Pertinent labs & imaging results that were available during my care of the patient were reviewed by me and considered in my medical  decision making (see chart for details).    MDM Rules/Calculators/A&P  74 year old female with chest pain.  Unfortunately imaging consistent with metastatic disease.  Right-sided chest pain is most likely related to pathologic fracture.  Area noted on imaging correlates to the area of her symptoms.  Discussed with patient and her family.  We will discharge her with as needed pain medication.  Close oncology follow-up.  Final Clinical Impression(s) / ED Diagnoses Final diagnoses:  Metastatic malignant neoplasm, unspecified site [facility])  Pathological fracture of rib, initial encounter    Rx / DC Orders ED Discharge Orders    None       Virgel Manifold, MD 01/12/20 2224

## 2020-01-10 NOTE — ED Triage Notes (Addendum)
Pt BIB [facility] from home. Pt reports 10/10 CP that started x1 week ago. EMS initial BP 268/140. 3 nitro, 324 ASA given in route. CP now 0/10, BP 150/79. Pt's only complaint now is pain in R breast that also started x1 week ago.

## 2020-01-10 NOTE — ED Notes (Signed)
Pt ambulated to bathroom unassisted.

## 2020-01-10 NOTE — ED Notes (Signed)
Patient transported to CT 

## 2020-01-10 NOTE — Discharge Instructions (Signed)
Unfortunately, your CT scan today shows evidence of what is likely metastatic cancer.  You have several different areas in your lungs and your bones.  You have a fracture in your right fifth rib which is causing the chest pain you have been experiencing.  Take the prescribed pain medicine as needed. You need to make an appointment to be evaluated in the oncology clinic.

## 2020-01-11 ENCOUNTER — Other Ambulatory Visit: Payer: Self-pay | Admitting: Oncology

## 2020-01-11 ENCOUNTER — Telehealth: Payer: Self-pay | Admitting: *Deleted

## 2020-01-11 NOTE — Telephone Encounter (Signed)
Call received from the patient's grand daughter stating they are requesting referral to be seen by Dr Jana Hakim per ER visit yesterday with noted abnormal CT suggestive of metastatic breast cancer.  This note will be sent to MD as well as new pt coordinator per communication for visit.

## 2020-01-13 ENCOUNTER — Other Ambulatory Visit: Payer: Self-pay

## 2020-01-13 ENCOUNTER — Other Ambulatory Visit: Payer: Self-pay | Admitting: Oncology

## 2020-01-13 ENCOUNTER — Telehealth: Payer: Self-pay | Admitting: Oncology

## 2020-01-13 ENCOUNTER — Inpatient Hospital Stay: Payer: PPO | Attending: Oncology

## 2020-01-13 ENCOUNTER — Ambulatory Visit (INDEPENDENT_AMBULATORY_CARE_PROVIDER_SITE_OTHER): Payer: PPO | Admitting: Family Medicine

## 2020-01-13 DIAGNOSIS — C7800 Secondary malignant neoplasm of unspecified lung: Secondary | ICD-10-CM

## 2020-01-13 DIAGNOSIS — R918 Other nonspecific abnormal finding of lung field: Secondary | ICD-10-CM | POA: Insufficient documentation

## 2020-01-13 DIAGNOSIS — M899 Disorder of bone, unspecified: Secondary | ICD-10-CM | POA: Diagnosis not present

## 2020-01-13 DIAGNOSIS — E119 Type 2 diabetes mellitus without complications: Secondary | ICD-10-CM

## 2020-01-13 LAB — FERRITIN: Ferritin: 63 ng/mL (ref 11–307)

## 2020-01-13 LAB — CBC WITH DIFFERENTIAL/PLATELET
Abs Immature Granulocytes: 0.02 10*3/uL (ref 0.00–0.07)
Basophils Absolute: 0 10*3/uL (ref 0.0–0.1)
Basophils Relative: 0 %
Eosinophils Absolute: 0.3 10*3/uL (ref 0.0–0.5)
Eosinophils Relative: 4 %
HCT: 36.2 % (ref 36.0–46.0)
Hemoglobin: 11.6 g/dL — ABNORMAL LOW (ref 12.0–15.0)
Immature Granulocytes: 0 %
Lymphocytes Relative: 34 %
Lymphs Abs: 2.3 10*3/uL (ref 0.7–4.0)
MCH: 27.2 pg (ref 26.0–34.0)
MCHC: 32 g/dL (ref 30.0–36.0)
MCV: 84.8 fL (ref 80.0–100.0)
Monocytes Absolute: 0.5 10*3/uL (ref 0.1–1.0)
Monocytes Relative: 7 %
Neutro Abs: 3.7 10*3/uL (ref 1.7–7.7)
Neutrophils Relative %: 55 %
Platelets: 208 10*3/uL (ref 150–400)
RBC: 4.27 MIL/uL (ref 3.87–5.11)
RDW: 12.5 % (ref 11.5–15.5)
WBC: 6.8 10*3/uL (ref 4.0–10.5)
nRBC: 0 % (ref 0.0–0.2)

## 2020-01-13 LAB — LACTATE DEHYDROGENASE: LDH: 223 U/L — ABNORMAL HIGH (ref 98–192)

## 2020-01-13 LAB — IRON AND TIBC
Iron: 48 ug/dL (ref 41–142)
Saturation Ratios: 15 % — ABNORMAL LOW (ref 21–57)
TIBC: 321 ug/dL (ref 236–444)
UIBC: 273 ug/dL (ref 120–384)

## 2020-01-13 LAB — COMPREHENSIVE METABOLIC PANEL
ALT: 12 U/L (ref 0–44)
AST: 16 U/L (ref 15–41)
Albumin: 3.9 g/dL (ref 3.5–5.0)
Alkaline Phosphatase: 91 U/L (ref 38–126)
Anion gap: 9 (ref 5–15)
BUN: 19 mg/dL (ref 8–23)
CO2: 28 mmol/L (ref 22–32)
Calcium: 9.6 mg/dL (ref 8.9–10.3)
Chloride: 102 mmol/L (ref 98–111)
Creatinine, Ser: 0.99 mg/dL (ref 0.44–1.00)
GFR calc Af Amer: >60 mL/min (ref >60)
GFR calc non Af Amer: 57 mL/min — ABNORMAL LOW (ref >60)
Glucose, Bld: 124 mg/dL — ABNORMAL HIGH (ref 70–99)
Potassium: 4 mmol/L (ref 3.5–5.1)
Sodium: 139 mmol/L (ref 135–145)
Total Bilirubin: 0.4 mg/dL (ref 0.3–1.2)
Total Protein: 7.6 g/dL (ref 6.5–8.1)

## 2020-01-13 LAB — RETICULOCYTES
Immature Retic Fract: 7.5 % (ref 2.3–15.9)
RBC.: 4.21 MIL/uL (ref 3.87–5.11)
Retic Count, Absolute: 71.1 10*3/uL (ref 19.0–186.0)
Retic Ct Pct: 1.7 % (ref 0.4–3.1)

## 2020-01-13 LAB — CEA (IN HOUSE-CHCC): CEA (CHCC-In House): <1 ng/mL (ref 0.00–5.00)

## 2020-01-13 LAB — FOLATE: Folate: 15.4 ng/mL (ref >5.9)

## 2020-01-13 LAB — VITAMIN B12: Vitamin B-12: 194 pg/mL (ref 180–914)

## 2020-01-13 NOTE — Progress Notes (Signed)
Samantha Lozano  [phone] [phone]     ID: Samantha Lozano DOB: 10-Jul-1946  MR#: [id number]  CBU#:[id number]  Patient Care Team: Orpah Melter, MD as PCP - General (Family Medicine) Javid Kemler, Virgie Dad, MD as Consulting Physician (Oncology) Chauncey Cruel, MD OTHER MD:  CHIEF COMPLAINT: metastatic cancer  CURRENT TREATMENT: workup in process   HISTORY OF CURRENT ILLNESS: Samantha Lozano presented to the ED on 01/10/2020 with right-sided chest pain for one week. She described it as intermittent sharp pain, worse with deep breathing and certain movements, located near the right breast. She underwent chest CT that day showing: innumerable bilateral pulmonary nodules concerning for metastatic disease; multiple lytic lesions involving bilateral ribs, proximal right humerus, and thoracic spine; probable nondisplaced pathologic fracture involving right lateral 5th rib; no acute cardiopulmonary process; no acute abnormality identified within upper abdomen.  She had bilateral screening mammography at [facility] on 05/26/2019 showing the breast density to be category C.  There was a 1.4 cm group of calcifications in the lower medial quadrant of the right breast which appeared changed.  She was brought back on 05/29/2019 for unilateral diagnostic right mammography which showed only benign scattered calcifications and benign vascular calcifications in the right breast.  There was no evidence of malignancy.  The patient's subsequent history is as detailed below.   INTERVAL HISTORY: Samantha Lozano was evaluated in the breast cancer clinic on 01/14/2020 accompanied by her son Samantha Lozano and her granddaughter Samantha Lozano.  Of note, she has received both Covid-19 vaccinations-- on 10/02/2019 and 10/23/2019.   REVIEW OF SYSTEMS: The patient denies unusual headaches, visual changes, nausea, vomiting, stiff neck, dizziness, or gait imbalance. There has been minimal cough, no phlegm  production, but she did experience pleuritic chest pain (now mostly resolved); no change in bowel or bladder habits, no melena or bright red blood per rectum and no hematuria.  The patient denies fever, rash, other bleeding, unexplained fatigue or unexplained weight loss.  She has not noted any skin changes.  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 Merryl Hacker.  A detailed review of systems was otherwise entirely negative.   PAST MEDICAL HISTORY: Past Medical History:  Diagnosis Date  . Diabetes mellitus, type 2 ([place])   . HLD (hyperlipidemia)   . HTN (hypertension)   . Leg edema   . Other specified disorders of thyroid     PAST SURGICAL HISTORY: Past Surgical History:  Procedure Laterality Date  . ABDOMINAL HYSTERECTOMY    . THYROID SURGERY      FAMILY HISTORY: Family History  Problem Relation Age of Onset  . Diabetes Mother   . Hypertension Mother   . Cancer Mother   . Obesity Mother   . Hypertension Father   . Cancer Father   . Obesity Father   The patient's father died at age 46 with cancer but the patient does not know what type.  He had many siblings and many of them had cancer but she does not know what types they may have been.  The patient's mother died at age 72 from what sounds like an anaplastic lymphoma.  The patient herself had 7 siblings.  One half sister died from ovarian cancer in her 49s.  1 brother died from pancreatic cancer age 57.   GYNECOLOGIC HISTORY:  No LMP recorded. Patient has had a hysterectomy. Menarche: 74 years old Age at first live birth: 74 years old Samantha Lozano  1 LMP 33s? HRT no  Hysterectomy? Yes, 09/2007, benign pathology BSO? 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  She is widowed lives by herself, with no pets.  Son Samantha Lozano runs a water purification program.  He has 1 child Samantha Lozano, who works in the [employer]  medicine department at [employer].  The patient is not a church attender.    ADVANCED DIRECTIVES: Son Samantha Lozano and granddaughter Samantha Lozano are jointly healthcare powers of attorney   HEALTH MAINTENANCE: Social History   Tobacco Use  . Smoking status: Never Smoker  . Smokeless tobacco: Never Used  Substance Use Topics  . Alcohol use: Not Currently  . Drug use: Not Currently     Colonoscopy: 03/2019  PAP: 06/2013, negative  Bone density:    Allergies  Allergen Reactions  . Lisinopril Cough    Current Outpatient Medications  Medication Sig Dispense Refill  . aspirin EC 81 MG tablet Take 81 mg by mouth daily.    . carvedilol (COREG) 25 MG tablet Take 25 mg by mouth 2 (two) times daily with a meal.    . glucose blood test strip Check BS twice daily 100 each 0  . hydrochlorothiazide (HYDRODIURIL) 25 MG tablet Take 25 mg by mouth daily.    [name] HYDROcodone-acetaminophen (NORCO/VICODIN) 5-325 MG tablet Take 1 tablet by mouth every 6 (six) hours as needed for moderate pain. 20 tablet 0  . levothyroxine (SYNTHROID, LEVOTHROID) 125 MCG tablet Take 125 mcg by mouth daily before breakfast.    . losartan (COZAAR) 50 MG tablet Take 1 tablet (50 mg total) by mouth daily. 90 tablet 0  . lovastatin (MEVACOR) 40 MG tablet Take 40 mg by mouth at bedtime.    . metFORMIN (GLUCOPHAGE) 850 MG tablet Take 1 tablet (850 mg total) by mouth daily. with food 30 tablet 0  . Omega-3 Fatty Acids (FISH OIL) 435 MG CAPS Take 435 mg by mouth daily at 2 PM.     . Vitamin D, Ergocalciferol, (DRISDOL) 1.25 MG (50000 UNIT) CAPS capsule Take 1 capsule (50,000 Units total) by mouth every 14 (fourteen) days. 6 capsule 0   No current facility-administered medications for this visit.    OBJECTIVE: White woman who appears stated age  26:   01/14/20 1634  BP: (!) 161/90  Pulse: 62  Resp: 18  Temp: 98.5 F (36.9 C)  SpO2: 99%     Body mass index is 40.38 kg/m.   Wt Readings from Last 3 Encounters:  01/14/20 220  lb 12.8 oz (100.2 kg)  01/10/20 214 lb 15.2 oz (97.5 kg)  12/30/19 215 lb (97.5 kg)      ECOG FS:1 - Symptomatic but completely ambulatory  Ocular: Sclerae unicteric, pupils round and equal, EOMs intact Ear-nose-throat: Wearing a mask Lymphatic: No cervical or supraclavicular adenopathy Lungs no rales or rhonchi, no wheezes Heart regular rate and rhythm Abd soft, obese, nontender, positive bowel sounds, no masses palpated MSK no focal spinal tenderness, no joint edema Neuro: non-focal, well-oriented, positive affect Breasts: Some tenderness to palpation in the right breast but no palpable masses, no skin changes or nipple changes of concern.  Left breast was unremarkable.  Both axillae are benign.   LAB RESULTS:  CMP     Component Value Date/Time   NA 139 01/13/2020 1204   NA 141 11/27/2018 1027   K 4.0 01/13/2020 1204   CL 102 01/13/2020 1204   CO2  28 01/13/2020 1204   GLUCOSE 124 (H) 01/13/2020 1204   BUN 19 01/13/2020 1204   BUN 19 11/27/2018 1027   CREATININE 0.99 01/13/2020 1204   CALCIUM 9.6 01/13/2020 1204   PROT 7.6 01/13/2020 1204   PROT 7.5 11/27/2018 1027   ALBUMIN 3.9 01/13/2020 1204   ALBUMIN 4.6 11/27/2018 1027   AST 16 01/13/2020 1204   ALT 12 01/13/2020 1204   ALKPHOS 91 01/13/2020 1204   BILITOT 0.4 01/13/2020 1204   BILITOT 0.6 11/27/2018 1027   GFRNONAA 57 (L) 01/13/2020 1204   GFRAA >60 01/13/2020 1204    No results found for: TOTALPROTELP, ALBUMINELP, A1GS, A2GS, BETS, BETA2SER, GAMS, MSPIKE, SPEI  Lab Results  Component Value Date   WBC 6.8 01/13/2020   NEUTROABS 3.7 01/13/2020   HGB 11.6 (L) 01/13/2020   HCT 36.2 01/13/2020   MCV 84.8 01/13/2020   PLT 208 01/13/2020    No results found for: LABCA2  No components found for: UEAVWU981  No results for input(s): INR in the last 168 hours.  No results found for: LABCA2  Lab Results  Component Value Date   XBJ478 14 01/13/2020    No results found for: GNF621  No results found  for: HYQ657  Lab Results  Component Value Date   CA2729 27.5 01/13/2020    No components found for: HGQUANT  Lab Results  Component Value Date   CEA1 <1.00 01/13/2020   /  CEA ([place])  Date Value Ref Range Status  01/13/2020 <1.00 0.00 - 5.00 ng/mL Final    Comment:    (NOTE) This test was performed using Architect's Chemiluminescent Microparticle Immunoassay. Values obtained from different assay methods cannot be used interchangeably. Please note that 5-10% of patients who smoke may see CEA levels up to 6.9 ng/mL. Performed at [facility], [place] [address], [place], [place] [address]      No results found for: AFPTUMOR  No results found for: CHROMOGRNA  No results found for: KPAFRELGTCHN, LAMBDASER, KAPLAMBRATIO (kappa/lambda light chains)  No results found for: HGBA, HGBA2QUANT, HGBFQUANT, HGBSQUAN (Hemoglobinopathy evaluation)   Lab Results  Component Value Date   LDH 223 (H) 01/13/2020    Lab Results  Component Value Date   IRON 48 01/13/2020   TIBC 321 01/13/2020   IRONPCTSAT 15 (L) 01/13/2020   (Iron and TIBC)  Lab Results  Component Value Date   FERRITIN 63 01/13/2020    Urinalysis No results found for: COLORURINE, APPEARANCEUR, LABSPEC, PHURINE, GLUCOSEU, HGBUR, BILIRUBINUR, KETONESUR, PROTEINUR, UROBILINOGEN, NITRITE, LEUKOCYTESUR   STUDIES: CT Chest W Contrast  Result Date: 01/10/2020 CLINICAL DATA:  10/10 chest pain that started 1 week ago. Pain in the right breast. EXAM: CT CHEST WITH CONTRAST TECHNIQUE: Multidetector CT imaging of the chest was performed during intravenous contrast administration. CONTRAST:  86m OMNIPAQUE IOHEXOL 300 MG/ML  SOLN COMPARISON:  None. FINDINGS: Cardiovascular: The heart size is mildly enlarged. There is no evidence for thoracic aortic aneurysm or dissection. There is no large centrally located pulmonary embolism. Mediastinum/Nodes: --there are few mildly enlarged mediastinal and  hilar lymph nodes. --No axillary lymphadenopathy. --No supraclavicular lymphadenopathy. --patient appears to be status post right hemithyroidectomy. --The esophagus is unremarkable Lungs/Pleura: There are innumerable bilateral pulmonary nodules. The dominant pulmonary nodule in the left is located in the superior segment of the left lower lobe measures approximately 2.5 cm (axial series 4, image 53). The dominant pulmonary nodule in the right is located in the right lower lobe and measures  approximately 1.9 cm (axial series 4, image 88). There is no pneumothorax. There is no significant pleural effusion. Upper Abdomen: The spleen appears to be at least borderline enlarged there is no acute abnormality in the upper abdomen. Musculoskeletal: There is a partially visualized lytic lesion involving the posterior elements of the T12 vertebral body (axial series 3, image 145). There are probable additional lytic lesions involving multiple ribs bilaterally. There is a questionable lytic lesion involving the right humeral head.There is a probable nondisplaced pathologic fracture involving the right lateral fifth rib (axial series 4, image 80). IMPRESSION: 1. Innumerable bilateral pulmonary nodules concerning for metastatic disease as detailed above. 2. There are multiple lytic lesions involving the bilateral ribs, proximal right humerus, and thoracic spine concerning for osseous metastatic disease. There is a probable nondisplaced pathologic fracture involving the right lateral fifth rib. 3. No acute cardiopulmonary process. No evidence for thoracic aortic dissection or aneurysm. No evidence for large centrally located pulmonary embolism. 4. Probable borderline splenomegaly involving the partially visualized spleen. No acute abnormality identified within the upper abdomen. Electronically Signed   By: Constance Holster M.D.   On: 01/10/2020 19:50   DG Chest Portable 1 View  Result Date: 01/10/2020 CLINICAL DATA:  Chest  pain and shortness of breath, history type II diabetes mellitus, hypertension EXAM: PORTABLE CHEST 1 VIEW COMPARISON:  Portable exam 1708 hours compared to 12/17/2017 FINDINGS: Upper normal heart size. Mediastinal contours and pulmonary vascularity normal. Atherosclerotic calcification aorta. LEFT mid lung nodular density question pulmonary nodule 24 x 18 mm. RIGHT upper lobe nodule 18 x 13 mm. Bibasilar atelectasis versus infiltrate. Question additional nodular densities at the LEFT base measuring 11 mm and 8 mm in size and in RIGHT upper lobe 8 mm diameter. No pleural effusion or pneumothorax. Osseous structures unremarkable. IMPRESSION: Bibasilar atelectasis versus infiltrate. Multiple nodular foci in both lungs concerning for pulmonary nodules/metastases; CT chest with contrast recommended for further evaluation. Findings called to Dr. Wilson Singer on 01/10/2020 at 1725 hours. Electronically Signed   By: Lavonia Dana M.D.   On: 01/10/2020 17:26     ELIGIBLE FOR AVAILABLE RESEARCH PROTOCOL: no  ASSESSMENT: 74 y.o. [facility] woman presenting 01/10/2020 with pleuritic chest pain, chest CT scan same day showing innumerable bilateral pulmonary nodules, largest in the superior left lower lobe measuring 2.5 cm largest on the right side in the lower lobe at 1.9 cm.  Scan also showed lytic lesions at T12, multiple ribs, and right humeral head  (a) CEA, CA 19-9, CA 27-29 all within normal limits 01/13/2020  (b) bone marrow biopsy 01/15/2020  (c) right breast mammography and ultrasonography  (d) head CT with and without contrast  PLAN: I met today with Samantha Lozano and her son and granddaughter to review her situation.  They understand she has more than 40 spots in the lungs--we reviewed the images--which are highly suspicious for cancer.  Especially in the absence of any fever, phlegm production, or even significant cough this is most consistent with cancer.  However it is difficult to know what cancer this would be.  We  reviewed the fact that there are more than 200 types of cancer and that we grouped them into carcinomas, sarcomas, and white cell cancers.  She had mammography in September 2020 which was negative and breast exam today is unrevealing.  She had a colonoscopy in July 2020 which did not show any cancer.  She is status post bilateral salpingo-oophorectomy.  She is seen regularly by a dermatologist and I do  not see any obvious melanotic lesions on her skin today.  Finally she has normal tumor markers.  They understand until we have a tissue diagnosis I really cannot say much more.  She is scheduled for bone marrow biopsy tomorrow and I hope we will be able to obtain the diagnosis through that.  If not we will likely biopsy one of the lung lesions.  I am also setting her up for a right breast mammogram and ultrasound given that she is symptomatic there.  Finally given the extensive lung involvement it would not surprise me if she had a symptomatic brain lesions and I am obtaining a screening CT of the brain for that.  We will follow up with MRI if there is any question from that test.  She will see me again 01/22/2020.  If we do not have an answer by then we may postpone that visit 1 week until we do.  Samantha Lozano has a good understanding of the overall plan. She agrees with it.  She will call with any problems that may develop before her next visit here.  Total encounter time 65 minutes.Sarajane Jews C. Houa Nie, MD 01/14/2020 5:37 PM Medical Oncology and Hematology [facility] [place], [place] [address] Tel. [phone]    Fax. [phone]   This document serves as a record of services personally performed by Lurline Del, MD. It was created on his behalf by Wilburn Mylar, a trained medical scribe. The creation of this record is based on the scribe's personal observations and the provider's statements to them.   I, Lurline Del MD, have reviewed the above documentation  for accuracy and completeness, and I agree with the above.    *Total Encounter Time as defined by the Centers for Medicare and Medicaid Services includes, in addition to the face-to-face time of a patient visit (documented in the note above) non-face-to-face time: obtaining and reviewing outside history, ordering and reviewing medications, tests or procedures, care coordination (communications with other health care professionals or caregivers) and documentation in the medical record.

## 2020-01-13 NOTE — Telephone Encounter (Signed)
Our office received a new pt referral from the ED for metastatic cancer. I spoke to the pt's granddaughter, Samantha Lozano, and scheduled an appt w/Dr. Jana Hakim on 5/6 at 4:45pm.

## 2020-01-14 ENCOUNTER — Other Ambulatory Visit: Payer: Self-pay

## 2020-01-14 ENCOUNTER — Inpatient Hospital Stay (HOSPITAL_BASED_OUTPATIENT_CLINIC_OR_DEPARTMENT_OTHER): Payer: PPO | Admitting: Oncology

## 2020-01-14 VITALS — BP 161/90 | HR 62 | Temp 98.5°F | Resp 18 | Ht 62.0 in | Wt 220.8 lb

## 2020-01-14 DIAGNOSIS — E119 Type 2 diabetes mellitus without complications: Secondary | ICD-10-CM | POA: Diagnosis not present

## 2020-01-14 DIAGNOSIS — C7800 Secondary malignant neoplasm of unspecified lung: Secondary | ICD-10-CM | POA: Diagnosis not present

## 2020-01-14 DIAGNOSIS — I10 Essential (primary) hypertension: Secondary | ICD-10-CM | POA: Diagnosis not present

## 2020-01-14 DIAGNOSIS — R918 Other nonspecific abnormal finding of lung field: Secondary | ICD-10-CM | POA: Diagnosis not present

## 2020-01-14 LAB — CANCER ANTIGEN 27.29: CA 27.29: 27.5 U/mL (ref 0.0–38.6)

## 2020-01-14 LAB — CANCER ANTIGEN 19-9: CA 19-9: 14 U/mL (ref 0–35)

## 2020-01-15 ENCOUNTER — Inpatient Hospital Stay: Payer: PPO

## 2020-01-15 ENCOUNTER — Other Ambulatory Visit: Payer: Self-pay

## 2020-01-15 ENCOUNTER — Inpatient Hospital Stay (HOSPITAL_BASED_OUTPATIENT_CLINIC_OR_DEPARTMENT_OTHER): Payer: PPO | Admitting: *Deleted

## 2020-01-15 VITALS — BP 159/69 | HR 59 | Temp 98.0°F | Resp 18

## 2020-01-15 DIAGNOSIS — C801 Malignant (primary) neoplasm, unspecified: Secondary | ICD-10-CM | POA: Diagnosis not present

## 2020-01-15 DIAGNOSIS — C7800 Secondary malignant neoplasm of unspecified lung: Secondary | ICD-10-CM | POA: Diagnosis not present

## 2020-01-15 DIAGNOSIS — R918 Other nonspecific abnormal finding of lung field: Secondary | ICD-10-CM | POA: Diagnosis not present

## 2020-01-15 DIAGNOSIS — C7989 Secondary malignant neoplasm of other specified sites: Secondary | ICD-10-CM | POA: Diagnosis not present

## 2020-01-15 LAB — CBC WITH DIFFERENTIAL (CANCER CENTER ONLY)
Abs Immature Granulocytes: 0.02 10*3/uL (ref 0.00–0.07)
Basophils Absolute: 0 10*3/uL (ref 0.0–0.1)
Basophils Relative: 0 %
Eosinophils Absolute: 0.2 10*3/uL (ref 0.0–0.5)
Eosinophils Relative: 3 %
HCT: 39.8 % (ref 36.0–46.0)
Hemoglobin: 13 g/dL (ref 12.0–15.0)
Immature Granulocytes: 0 %
Lymphocytes Relative: 32 %
Lymphs Abs: 2.2 10*3/uL (ref 0.7–4.0)
MCH: 28.1 pg (ref 26.0–34.0)
MCHC: 32.7 g/dL (ref 30.0–36.0)
MCV: 86.1 fL (ref 80.0–100.0)
Monocytes Absolute: 0.5 10*3/uL (ref 0.1–1.0)
Monocytes Relative: 7 %
Neutro Abs: 3.9 10*3/uL (ref 1.7–7.7)
Neutrophils Relative %: 58 %
Platelet Count: 215 10*3/uL (ref 150–400)
RBC: 4.62 MIL/uL (ref 3.87–5.11)
RDW: 12.3 % (ref 11.5–15.5)
WBC Count: 6.8 10*3/uL (ref 4.0–10.5)
nRBC: 0 % (ref 0.0–0.2)

## 2020-01-15 MED ORDER — LIDOCAINE HCL 2 % IJ SOLN
INTRAMUSCULAR | Status: AC
  Filled 2020-01-15: qty 20

## 2020-01-15 NOTE — Progress Notes (Signed)
INDICATION: metastatic cancer evaluate marrow involvement    Bone Marrow Biopsy and Aspiration Procedure Note   Informed consent was obtained and potential risks including bleeding, infection and pain were reviewed with the patient.  The patient's name, date of birth, identification, consent and allergies were verified prior to the start of procedure and time out was performed.  The right posterior iliac crest was chosen as the site of biopsy.  The skin was prepped with ChloraPrep.   16 cc of 2% lidocaine was used to provide local anaesthesia.   10 cc of bone marrow aspirate was obtained followed by 1cm biopsy.  Pressure was applied to the biopsy site and bandage was placed over the biopsy site. Patient was made to lie on the back for 30 mins prior to discharge.  The procedure was tolerated well. COMPLICATIONS: None BLOOD LOSS: none The patient was discharged home in stable condition with a 1 week follow up to review results.  Patient was provided with post bone marrow biopsy instructions and instructed to call if there was any bleeding or worsening pain.  Specimens sent for flow cytometry, cytogenetics and additional studies.  Signed Scot Dock, NP

## 2020-01-15 NOTE — Progress Notes (Signed)
Pt denies complaints. Drsg C/D/I. VSS

## 2020-01-15 NOTE — Patient Instructions (Signed)
Bone Marrow Aspiration and Bone Marrow Biopsy, Adult, Care After This sheet gives you information about how to care for yourself after your procedure. Your health care provider may also give you more specific instructions. If you have problems or questions, contact your health care provider. What can I expect after the procedure? After the procedure, it is common to have:  Mild pain and tenderness.  Swelling.  Bruising. Follow these instructions at home: Puncture site care   Follow instructions from your health care provider about how to take care of the puncture site. Make sure you: ? Wash your hands with soap and water before and after you change your bandage (dressing). If soap and water are not available, use hand sanitizer. ? Change your dressing as told by your health care provider.  Check your puncture site every day for signs of infection. Check for: ? More redness, swelling, or pain. ? Fluid or blood. ? Warmth. ? Pus or a bad smell. Activity  Return to your normal activities as told by your health care provider. Ask your health care provider what activities are safe for you.  Do not lift anything that is heavier than 10 lb (4.5 kg), or the limit that you are told, until your health care provider says that it is safe.  Do not drive for 24 hours if you were given a sedative during your procedure. General instructions   Take over-the-counter and prescription medicines only as told by your health care provider.  Do not take baths, swim, or use a hot tub until your health care provider approves. Ask your health care provider if you may take showers. You may only be allowed to take sponge baths.  If directed, put ice on the affected area. To do this: ? Put ice in a plastic bag. ? Place a towel between your skin and the bag. ? Leave the ice on for 20 minutes, 2-3 times a day.  Keep all follow-up visits as told by your health care provider. This is important. Contact a  health care provider if:  Your pain is not controlled with medicine.  You have a fever.  You have more redness, swelling, or pain around the puncture site.  You have fluid or blood coming from the puncture site.  Your puncture site feels warm to the touch.  You have pus or a bad smell coming from the puncture site. Summary  After the procedure, it is common to have mild pain, tenderness, swelling, and bruising.  Follow instructions from your health care provider about how to take care of the puncture site and what activities are safe for you.  Take over-the-counter and prescription medicines only as told by your health care provider.  Contact a health care provider if you have any signs of infection, such as fluid or blood coming from the puncture site. This information is not intended to replace advice given to you by your health care provider. Make sure you discuss any questions you have with your health care provider. Document Revised: 01/13/2019 Document Reviewed: 01/13/2019 Elsevier Patient Education  2020 [employer]  

## 2020-01-18 ENCOUNTER — Telehealth: Payer: Self-pay | Admitting: Oncology

## 2020-01-18 NOTE — Telephone Encounter (Signed)
Scheduled appt per 5/6 los. Called pt to review appt info. Pt did not answer and does not have a voicemail. Faxed order to [facility].

## 2020-01-19 ENCOUNTER — Other Ambulatory Visit: Payer: Self-pay

## 2020-01-20 ENCOUNTER — Other Ambulatory Visit: Payer: Self-pay | Admitting: Oncology

## 2020-01-20 DIAGNOSIS — C7800 Secondary malignant neoplasm of unspecified lung: Secondary | ICD-10-CM

## 2020-01-21 ENCOUNTER — Ambulatory Visit (HOSPITAL_COMMUNITY)
Admission: RE | Admit: 2020-01-21 | Discharge: 2020-01-21 | Disposition: A | Discharge status: Home / Self Care | Payer: PPO | Source: Ambulatory Visit | Attending: Oncology | Admitting: Oncology

## 2020-01-21 ENCOUNTER — Encounter (HOSPITAL_COMMUNITY): Payer: Self-pay

## 2020-01-21 ENCOUNTER — Other Ambulatory Visit (INDEPENDENT_AMBULATORY_CARE_PROVIDER_SITE_OTHER): Payer: Self-pay | Admitting: Family Medicine

## 2020-01-21 ENCOUNTER — Other Ambulatory Visit: Payer: Self-pay

## 2020-01-21 DIAGNOSIS — E119 Type 2 diabetes mellitus without complications: Secondary | ICD-10-CM | POA: Insufficient documentation

## 2020-01-21 DIAGNOSIS — I10 Essential (primary) hypertension: Secondary | ICD-10-CM | POA: Insufficient documentation

## 2020-01-21 DIAGNOSIS — C801 Malignant (primary) neoplasm, unspecified: Secondary | ICD-10-CM | POA: Insufficient documentation

## 2020-01-21 DIAGNOSIS — C7800 Secondary malignant neoplasm of unspecified lung: Secondary | ICD-10-CM | POA: Diagnosis not present

## 2020-01-21 DIAGNOSIS — C3490 Malignant neoplasm of unspecified part of unspecified bronchus or lung: Secondary | ICD-10-CM | POA: Diagnosis not present

## 2020-01-21 MED ORDER — SODIUM CHLORIDE (PF) 0.9 % IJ SOLN
INTRAMUSCULAR | Status: AC
  Filled 2020-01-21: qty 50

## 2020-01-21 MED ORDER — IOHEXOL 300 MG/ML  SOLN
100.0000 mL | Freq: Once | INTRAMUSCULAR | Status: AC | PRN
  Administered 2020-01-21: 100 mL via INTRAVENOUS

## 2020-01-21 NOTE — Progress Notes (Signed)
[facility]  [phone] [phone]     ID: Samantha Lozano DOB: [birth year]  MR#: [id number]  GBT#:[id number]  Patient Care Team: Orpah Melter, MD as PCP - General (Family Medicine) Melanni Benway, Virgie Dad, MD as Consulting Physician (Oncology) Chauncey Cruel, MD OTHER MD:  CHIEF COMPLAINT: metastatic cancer  CURRENT TREATMENT: workup in process   INTERVAL HISTORY: Samantha Lozano returns today for follow up of her metastatic cancer.  She is accompanied by her son and granddaughter.  She was evaluated in the breast cancer clinic on 01/14/2020.  Since consultation, she underwent bone marrow biopsy on 01/15/2020. Pathology from the procedure ([id number]) showed: normocellular marrow with trilineage hematopoiesis; no evidence of metastatic carcinoma. Peripheral blood was morphologically unremarkable.  She also underwent head CT yesterday, 01/21/2020.  This showed no evidence of brain metastases.  We have consulted with interventional radiology (Dr. Vernard Gambles) regarding the possibility of doing a biopsy of one of the lung lesions.  He feels this is a high risk procedure.   REVIEW OF SYSTEMS: Samantha Lozano feels remarkably well.  The pain that took her to the emergency room never recurred.  She does not have a cough pleurisy or phlegm and she still walks 3 to 4 miles most days.  She has not had a change in bowel habits, rash, unusual fatigue, or any other symptom at present.  A detailed review of systems was otherwise noncontributory   HISTORY OF CURRENT ILLNESS: From the original intake note:  Samantha Lozano presented to the ED on 01/10/2020 with right-sided chest pain for one week. She described it as intermittent sharp pain, worse with deep breathing and certain movements, located near the right breast. She underwent chest CT that day showing: innumerable bilateral pulmonary nodules concerning for metastatic disease; multiple lytic lesions involving bilateral ribs,  proximal right humerus, and thoracic spine; probable nondisplaced pathologic fracture involving right lateral 5th rib; no acute cardiopulmonary process; no acute abnormality identified within upper abdomen.  She had bilateral screening mammography at [facility] on 05/26/2019 showing the breast density to be category C.  There was a 1.4 cm group of calcifications in the lower medial quadrant of the right breast which appeared changed.  She was brought back on 05/29/2019 for unilateral diagnostic right mammography which showed only benign scattered calcifications and benign vascular calcifications in the right breast.  There was no evidence of malignancy.  The patient's subsequent history is as detailed below.   PAST MEDICAL HISTORY: Past Medical History:  Diagnosis Date  . Diabetes mellitus, type 2 ([place])   . HLD (hyperlipidemia)   . HTN (hypertension)   . Leg edema   . Other specified disorders of thyroid     PAST SURGICAL HISTORY: Past Surgical History:  Procedure Laterality Date  . ABDOMINAL HYSTERECTOMY    . THYROID SURGERY      FAMILY HISTORY: Family History  Problem Relation Age of Onset  . Diabetes Mother   . Hypertension Mother   . Cancer Mother   . Obesity Mother   . Hypertension Father   . Cancer Father   . Obesity Father   The patient's father died at age 56 with cancer but the patient does not know what type.  He had many siblings and many of them had cancer but she does not know what types they may have been.  The patient's mother died at age 98 from what sounds like an anaplastic lymphoma.  The patient herself had 7 siblings.  One half sister  died from ovarian cancer in her 32s.  1 brother died from pancreatic cancer age 19.   GYNECOLOGIC HISTORY:  No LMP recorded. Patient has had a hysterectomy. Menarche: 74 years old Age at first live birth: 74 years old GX P 1 LMP 24s? HRT no  Hysterectomy? Yes, 09/2007, benign pathology BSO? 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  She is widowed lives by herself, with no pets.  Son Samantha Lozano runs a water purification program.  He has 1 child [name], who works in the [employer] medicine department at [employer].  The patient is not a church attender.    ADVANCED DIRECTIVES: Son Samantha Lozano and granddaughter [name] are jointly healthcare powers of attorney   HEALTH MAINTENANCE: Social History   Tobacco Use  . Smoking status: Never Smoker  . Smokeless tobacco: Never Used  Substance Use Topics  . Alcohol use: Not Currently  . Drug use: Not Currently     Colonoscopy: 03/2019  PAP: 06/2013, negative  Bone density:    Allergies  Allergen Reactions  . Lisinopril Cough    Current Outpatient Medications  Medication Sig Dispense Refill  . aspirin EC 81 MG tablet Take 81 mg by mouth daily.    . carvedilol (COREG) 25 MG tablet Take 25 mg by mouth 2 (two) times daily with a meal.    . glucose blood test strip Check BS twice daily 100 each 0  . hydrochlorothiazide (HYDRODIURIL) 25 MG tablet Take 25 mg by mouth daily.    [name] HYDROcodone-acetaminophen (NORCO/VICODIN) 5-325 MG tablet Take 1 tablet by mouth every 6 (six) hours as needed for moderate pain. 20 tablet 0  . levothyroxine (SYNTHROID, LEVOTHROID) 125 MCG tablet Take 125 mcg by mouth daily before breakfast.    . losartan (COZAAR) 50 MG tablet Take 1 tablet (50 mg total) by mouth daily. 90 tablet 0  . lovastatin (MEVACOR) 40 MG tablet Take 40 mg by mouth at bedtime.    . metFORMIN (GLUCOPHAGE) 850 MG tablet Take 1 tablet (850 mg total) by mouth daily. with food 30 tablet 0  . Omega-3 Fatty Acids (FISH OIL) 435 MG CAPS Take 435 mg by mouth daily at 2 PM.     . Vitamin D, Ergocalciferol, (DRISDOL) 1.25 MG (50000 UNIT) CAPS capsule Take 1 capsule (50,000 Units total) by mouth every 14 (fourteen) days. 6 capsule 0   No current facility-administered medications for this visit.     OBJECTIVE: White woman in no acute distress  Vitals:   01/22/20 1118  BP: (!) 154/75  Pulse: (!) 59  Resp: 18  Temp: 98.5 F (36.9 C)  SpO2: 99%     Body mass index is 40.48 kg/m.   Wt Readings from Last 3 Encounters:  01/22/20 221 lb 4.8 oz (100.4 kg)  01/14/20 220 lb 12.8 oz (100.2 kg)  01/10/20 214 lb 15.2 oz (97.5 kg)      ECOG FS:1 - Symptomatic but completely ambulatory  Sclerae unicteric, EOMs intact Wearing a mask No cervical or supraclavicular adenopathy Lungs no rales or rhonchi Heart regular rate and rhythm Abd soft, obese, nontender, positive bowel sounds MSK no focal spinal tenderness, no upper extremity lymphedema Neuro: nonfocal, well oriented, appropriate affect Breasts: Deferred   LAB RESULTS:  CMP     Component Value Date/Time   NA 139 01/13/2020 1204   NA 141 11/27/2018 1027   K 4.0 01/13/2020 1204  CL 102 01/13/2020 1204   CO2 28 01/13/2020 1204   GLUCOSE 124 (H) 01/13/2020 1204   BUN 19 01/13/2020 1204   BUN 19 11/27/2018 1027   CREATININE 0.99 01/13/2020 1204   CALCIUM 9.6 01/13/2020 1204   PROT 7.6 01/13/2020 1204   PROT 7.5 11/27/2018 1027   ALBUMIN 3.9 01/13/2020 1204   ALBUMIN 4.6 11/27/2018 1027   AST 16 01/13/2020 1204   ALT 12 01/13/2020 1204   ALKPHOS 91 01/13/2020 1204   BILITOT 0.4 01/13/2020 1204   BILITOT 0.6 11/27/2018 1027   GFRNONAA 57 (L) 01/13/2020 1204   GFRAA >60 01/13/2020 1204    No results found for: TOTALPROTELP, ALBUMINELP, A1GS, A2GS, BETS, BETA2SER, GAMS, MSPIKE, SPEI  Lab Results  Component Value Date   WBC 6.8 01/15/2020   NEUTROABS 3.9 01/15/2020   HGB 13.0 01/15/2020   HCT 39.8 01/15/2020   MCV 86.1 01/15/2020   PLT 215 01/15/2020    No results found for: LABCA2  No components found for: OJJKKX381  No results for input(s): INR in the last 168 hours.  No results found for: LABCA2  Lab Results  Component Value Date   WEX937 14 01/13/2020    No results found for: JIR678  No  results found for: LFY101  Lab Results  Component Value Date   CA2729 27.5 01/13/2020    No components found for: HGQUANT  Lab Results  Component Value Date   CEA1 <1.00 01/13/2020   /  CEA ([place])  Date Value Ref Range Status  01/13/2020 <1.00 0.00 - 5.00 ng/mL Final    Comment:    (NOTE) This test was performed using Architect's Chemiluminescent Microparticle Immunoassay. Values obtained from different assay methods cannot be used interchangeably. Please note that 5-10% of patients who smoke may see CEA levels up to 6.9 ng/mL. Performed at [facility], [place] [address] [place], [place] [address]      No results found for: AFPTUMOR  No results found for: CHROMOGRNA  No results found for: KPAFRELGTCHN, LAMBDASER, KAPLAMBRATIO (kappa/lambda light chains)  No results found for: HGBA, HGBA2QUANT, HGBFQUANT, HGBSQUAN (Hemoglobinopathy evaluation)   Lab Results  Component Value Date   LDH 223 (H) 01/13/2020    Lab Results  Component Value Date   IRON 48 01/13/2020   TIBC 321 01/13/2020   IRONPCTSAT 15 (L) 01/13/2020   (Iron and TIBC)  Lab Results  Component Value Date   FERRITIN 63 01/13/2020    Urinalysis No results found for: COLORURINE, APPEARANCEUR, LABSPEC, PHURINE, GLUCOSEU, HGBUR, BILIRUBINUR, KETONESUR, PROTEINUR, UROBILINOGEN, NITRITE, LEUKOCYTESUR   STUDIES: CT Head W Wo Contrast  Result Date: 01/22/2020 CLINICAL DATA:  Lung cancer staging EXAM: CT HEAD WITHOUT AND WITH CONTRAST TECHNIQUE: Contiguous axial images were obtained from the base of the skull through the vertex without and with intravenous contrast CONTRAST:  158m OMNIPAQUE IOHEXOL 300 MG/ML  SOLN COMPARISON:  None available FINDINGS: Brain: No swelling or enhancement to suggest metastatic disease. No incidental infarct, hemorrhage, hydrocephalus, or collection. Vascular: Major vessels are enhancing Skull: Negative for bone lesion Sinuses/Orbits: Bilateral  cataract resection.  No orbital mass. IMPRESSION: Negative for metastatic disease. Electronically Signed   By: JMonte FantasiaM.D.   On: 01/22/2020 08:46   CT Chest W Contrast  Result Date: 01/10/2020 CLINICAL DATA:  10/10 chest pain that started 1 week ago. Pain in the right breast. EXAM: CT CHEST WITH CONTRAST TECHNIQUE: Multidetector CT imaging of the chest was performed during intravenous contrast administration.  CONTRAST:  62m OMNIPAQUE IOHEXOL 300 MG/ML  SOLN COMPARISON:  None. FINDINGS: Cardiovascular: The heart size is mildly enlarged. There is no evidence for thoracic aortic aneurysm or dissection. There is no large centrally located pulmonary embolism. Mediastinum/Nodes: --there are few mildly enlarged mediastinal and hilar lymph nodes. --No axillary lymphadenopathy. --No supraclavicular lymphadenopathy. --patient appears to be status post right hemithyroidectomy. --The esophagus is unremarkable Lungs/Pleura: There are innumerable bilateral pulmonary nodules. The dominant pulmonary nodule in the left is located in the superior segment of the left lower lobe measures approximately 2.5 cm (axial series 4, image 53). The dominant pulmonary nodule in the right is located in the right lower lobe and measures approximately 1.9 cm (axial series 4, image 88). There is no pneumothorax. There is no significant pleural effusion. Upper Abdomen: The spleen appears to be at least borderline enlarged there is no acute abnormality in the upper abdomen. Musculoskeletal: There is a partially visualized lytic lesion involving the posterior elements of the T12 vertebral body (axial series 3, image 145). There are probable additional lytic lesions involving multiple ribs bilaterally. There is a questionable lytic lesion involving the right humeral head.There is a probable nondisplaced pathologic fracture involving the right lateral fifth rib (axial series 4, image 80). IMPRESSION: 1. Innumerable bilateral pulmonary  nodules concerning for metastatic disease as detailed above. 2. There are multiple lytic lesions involving the bilateral ribs, proximal right humerus, and thoracic spine concerning for osseous metastatic disease. There is a probable nondisplaced pathologic fracture involving the right lateral fifth rib. 3. No acute cardiopulmonary process. No evidence for thoracic aortic dissection or aneurysm. No evidence for large centrally located pulmonary embolism. 4. Probable borderline splenomegaly involving the partially visualized spleen. No acute abnormality identified within the upper abdomen. Electronically Signed   By: CConstance HolsterM.D.   On: 01/10/2020 19:50   DG Chest Portable 1 View  Result Date: 01/10/2020 CLINICAL DATA:  Chest pain and shortness of breath, history type II diabetes mellitus, hypertension EXAM: PORTABLE CHEST 1 VIEW COMPARISON:  Portable exam 1708 hours compared to 12/17/2017 FINDINGS: Upper normal heart size. Mediastinal contours and pulmonary vascularity normal. Atherosclerotic calcification aorta. LEFT mid lung nodular density question pulmonary nodule 24 x 18 mm. RIGHT upper lobe nodule 18 x 13 mm. Bibasilar atelectasis versus infiltrate. Question additional nodular densities at the LEFT base measuring 11 mm and 8 mm in size and in RIGHT upper lobe 8 mm diameter. No pleural effusion or pneumothorax. Osseous structures unremarkable. IMPRESSION: Bibasilar atelectasis versus infiltrate. Multiple nodular foci in both lungs concerning for pulmonary nodules/metastases; CT chest with contrast recommended for further evaluation. Findings called to Dr. KWilson Singeron 01/10/2020 at 1725 hours. Electronically Signed   By: MLavonia DanaM.D.   On: 01/10/2020 17:26     ELIGIBLE FOR AVAILABLE RESEARCH PROTOCOL: no  ASSESSMENT: 74[facility] 01[facility]2020 with pleuritic chest pain, chest CT scan same day showing innumerable bilateral pulmonary nodules, largest in the superior left lower  lobe measuring 2.5 cm largest on the right side in the lower lobe at 1.9 cm.  Scan also showed lytic lesions at T12, multiple ribs, and right humeral head  (a) CEA, CA 19-9, CA 27-29 all within normal limits 01/13/2020  (b) bone marrow biopsy 01/15/2020 nondiagnostic  (c) right breast mammography and ultrasonography  (d) head CT with and without contrast 01/21/2020 no evidence of meta metastases   PLAN: I discussed BRhyansituation as it stands today with her and her family.  They  understand that the fact that the bone marrow biopsy was nondiagnostic does not mean that she does not have a cancer in her bones.  It just means that there was no cancer in that particular spot where we did biopsy her.  It is favorable that her head CT was negative.  It is also favorable that she continues to feel as well as she is.  The question is how to proceed.  I have discussed her situation with Dr. Vernard Gambles in interventional radiology.  He suggest we obtain a PET scan to look for alternate biopsy sites and if there is not 1 then we would have to proceed to a biopsy of one of the lung lesions although he is concerned that this might cause significant morbidity.  Accordingly I am setting of Barta up for a PET scan.  Once we get those results we can set her up for an appropriate second biopsy.  We are also waiting on the results of her mammography and ultrasonography and I have sent [facility] a note asking them to move that up.  Tentatively I am scheduling her to return to see me in about 3 weeks.  We should have definitive results by that time.   Virgie Dad. Maranatha Grossi, MD 01/22/2020 1:01 PM Medical Oncology and Hematology [facility] [place], [place] [address] Tel. [phone]    Fax. [phone]   This document serves as a record of services personally performed by Lurline Del, MD. It was created on his behalf by Wilburn Mylar, a trained medical scribe. The creation of this  record is based on the scribe's personal observations and the provider's statements to them.   I, Lurline Del MD, have reviewed the above documentation for accuracy and completeness, and I agree with the above.   *Total Encounter Time as defined by the Centers for Medicare and Medicaid Services includes, in addition to the face-to-face time of a patient visit (documented in the note above) non-face-to-face time: obtaining and reviewing outside history, ordering and reviewing medications, tests or procedures, care coordination (communications with other health care professionals or caregivers) and documentation in the medical record.

## 2020-01-22 ENCOUNTER — Encounter (HOSPITAL_COMMUNITY): Payer: Self-pay

## 2020-01-22 ENCOUNTER — Other Ambulatory Visit: Payer: Self-pay

## 2020-01-22 ENCOUNTER — Inpatient Hospital Stay: Payer: PPO | Admitting: Oncology

## 2020-01-22 VITALS — BP 154/75 | HR 59 | Temp 98.5°F | Resp 18 | Ht 62.0 in | Wt 221.3 lb

## 2020-01-22 DIAGNOSIS — R918 Other nonspecific abnormal finding of lung field: Secondary | ICD-10-CM | POA: Diagnosis not present

## 2020-01-22 DIAGNOSIS — C7951 Secondary malignant neoplasm of bone: Secondary | ICD-10-CM

## 2020-01-22 DIAGNOSIS — C7800 Secondary malignant neoplasm of unspecified lung: Secondary | ICD-10-CM | POA: Diagnosis not present

## 2020-01-22 NOTE — Progress Notes (Signed)
Samantha Lozano Female, 74 y.o., [birth year] MRN:  [id number] Phone:  [phone] [name]) PCP:  Orpah Melter, MD Coverage:  [employer]po  RE: Biopsy Received: Yesterday Message Contents  Arne Cleveland, MD  Magrinat, Virgie Dad, MD   Cc: [name]   [name]  These Lung nodules are high biopsy risk (bleed, ptx, death).  I would recommend PET or CT Abd/Pelvis c/ contrast to assess for possible safer core bx target  Let me know how you'd like to proceed  Thanks  Quillian Quince   Previous Messages  ----- Message -----  From: [name]  Sent: 01/21/2020  9:57 AM EDT  To: Ir Procedure Requests  Subject: Biopsy                      Procedure Requested: CT Biopsy    Reason for Procedure: diagnose metastatic cancer    Provider Requesting: Dr. Chauncey Cruel  Provider Telephone: [phone]    Other Info: Please biopsy most accessible lung lesion for definitive diagnosis

## 2020-01-25 ENCOUNTER — Telehealth: Payer: Self-pay | Admitting: Oncology

## 2020-01-25 DIAGNOSIS — R921 Mammographic calcification found on diagnostic imaging of breast: Secondary | ICD-10-CM | POA: Diagnosis not present

## 2020-01-25 DIAGNOSIS — N6312 Unspecified lump in the right breast, upper inner quadrant: Secondary | ICD-10-CM | POA: Diagnosis not present

## 2020-01-25 NOTE — Telephone Encounter (Signed)
Scheduled appts per 5/14 los. Pt confirmed appt date and time.

## 2020-01-27 ENCOUNTER — Encounter (HOSPITAL_COMMUNITY): Payer: Self-pay | Admitting: Oncology

## 2020-01-27 ENCOUNTER — Ambulatory Visit (INDEPENDENT_AMBULATORY_CARE_PROVIDER_SITE_OTHER): Payer: PPO | Admitting: Family Medicine

## 2020-01-28 LAB — SURGICAL PATHOLOGY

## 2020-02-03 ENCOUNTER — Encounter: Payer: Self-pay | Admitting: Oncology

## 2020-02-03 ENCOUNTER — Telehealth: Payer: Self-pay | Admitting: Oncology

## 2020-02-03 ENCOUNTER — Other Ambulatory Visit: Payer: Self-pay | Admitting: Radiology

## 2020-02-03 DIAGNOSIS — R921 Mammographic calcification found on diagnostic imaging of breast: Secondary | ICD-10-CM | POA: Diagnosis not present

## 2020-02-03 DIAGNOSIS — R928 Other abnormal and inconclusive findings on diagnostic imaging of breast: Secondary | ICD-10-CM | POA: Diagnosis not present

## 2020-02-03 DIAGNOSIS — N6312 Unspecified lump in the right breast, upper inner quadrant: Secondary | ICD-10-CM | POA: Diagnosis not present

## 2020-02-03 DIAGNOSIS — N6315 Unspecified lump in the right breast, overlapping quadrants: Secondary | ICD-10-CM | POA: Diagnosis not present

## 2020-02-03 DIAGNOSIS — N6031 Fibrosclerosis of right breast: Secondary | ICD-10-CM | POA: Diagnosis not present

## 2020-02-03 NOTE — Telephone Encounter (Signed)
Called pt per 5/25 sch message - no answer . Left message for patient to call back if reschedule is still needed

## 2020-02-05 ENCOUNTER — Ambulatory Visit (HOSPITAL_COMMUNITY)
Admission: RE | Admit: 2020-02-05 | Discharge: 2020-02-05 | Disposition: A | Discharge status: Home / Self Care | Payer: PPO | Source: Ambulatory Visit | Attending: Oncology | Admitting: Oncology

## 2020-02-05 ENCOUNTER — Other Ambulatory Visit: Payer: Self-pay

## 2020-02-05 ENCOUNTER — Other Ambulatory Visit: Payer: Self-pay | Admitting: Oncology

## 2020-02-05 DIAGNOSIS — C801 Malignant (primary) neoplasm, unspecified: Secondary | ICD-10-CM | POA: Insufficient documentation

## 2020-02-05 DIAGNOSIS — C7989 Secondary malignant neoplasm of other specified sites: Secondary | ICD-10-CM | POA: Insufficient documentation

## 2020-02-05 DIAGNOSIS — C7800 Secondary malignant neoplasm of unspecified lung: Secondary | ICD-10-CM | POA: Insufficient documentation

## 2020-02-05 DIAGNOSIS — C7951 Secondary malignant neoplasm of bone: Secondary | ICD-10-CM | POA: Diagnosis not present

## 2020-02-05 LAB — GLUCOSE, CAPILLARY: Glucose-Capillary: 148 mg/dL — ABNORMAL HIGH (ref 70–99)

## 2020-02-05 MED ORDER — FLUDEOXYGLUCOSE F - 18 (FDG) INJECTION
10.2200 | Freq: Once | INTRAVENOUS | Status: AC | PRN
  Administered 2020-02-05: 10.22 via INTRAVENOUS

## 2020-02-09 ENCOUNTER — Other Ambulatory Visit: Payer: Self-pay | Admitting: Oncology

## 2020-02-10 ENCOUNTER — Other Ambulatory Visit: Payer: Self-pay | Admitting: *Deleted

## 2020-02-10 ENCOUNTER — Other Ambulatory Visit: Payer: Self-pay | Admitting: Oncology

## 2020-02-10 DIAGNOSIS — C7951 Secondary malignant neoplasm of bone: Secondary | ICD-10-CM

## 2020-02-10 DIAGNOSIS — C7800 Secondary malignant neoplasm of unspecified lung: Secondary | ICD-10-CM

## 2020-02-10 NOTE — Progress Notes (Signed)
Ms. Samantha Lozano called back to let us know she was willing to undergo biopsy.  I am setting that up for her

## 2020-02-11 ENCOUNTER — Encounter (HOSPITAL_COMMUNITY): Payer: Self-pay

## 2020-02-11 NOTE — Progress Notes (Signed)
[facility]   Samantha Lozano Female, 74 y.o., 04/01/1946 MRN:  [id number] Phone:  [phone] [name]) PCP:  Orpah Melter, MD Coverage:  [employer] Ppo Next Appt With Radiology ([facility]-CT 1) 02/17/2020 at 9:00 AM  RE: Biopsy Resubmit/Pet Final Received: Yesterday Message Contents  Arne Cleveland, MD  [name]      Ok   CT core L paraspinal mass   DDH   Previous Messages  ----- Message -----  From: Chauncey Cruel, MD  Sent: 02/10/2020  1:15 PM EDT  To: Arne Cleveland, MD  Subject: RE: Biopsy Resubmit/Pet Final           I entered the order-- thanks for helping with this!   [name]  ----- Message -----  From: Arne Cleveland, MD  Sent: 02/10/2020 10:50 AM EDT  To: Chauncey Cruel, MD  Subject: FW: Biopsy Resubmit/Pet Final           [name]  The 4cm L paraspinal met is much safer than lung lesions and big enough for multiples cores if that works for you.  Maryellen Pile   ----- Message -----  From: [name]  Sent: 02/10/2020 10:37 AM EDT  To: Ir Procedure Requests  Subject: Biopsy Resubmit/Pet Final             Procedure Requested: CT Biopsy    Reason for Procedure: diagnose metastatic cancer    Provider Requesting: Chauncey Cruel  Provider Telephone: [phone]    Other Info: Please biopsy most accessible lung lesion for definitive diagnosis

## 2020-02-15 ENCOUNTER — Inpatient Hospital Stay: Payer: PPO | Admitting: Oncology

## 2020-02-16 ENCOUNTER — Other Ambulatory Visit: Payer: Self-pay | Admitting: Radiology

## 2020-02-17 ENCOUNTER — Other Ambulatory Visit: Payer: Self-pay

## 2020-02-17 ENCOUNTER — Encounter (HOSPITAL_COMMUNITY): Payer: Self-pay

## 2020-02-17 ENCOUNTER — Ambulatory Visit (HOSPITAL_COMMUNITY)
Admission: RE | Admit: 2020-02-17 | Discharge: 2020-02-17 | Disposition: A | Discharge status: Home / Self Care | Payer: PPO | Source: Ambulatory Visit | Attending: Oncology | Admitting: Oncology

## 2020-02-17 DIAGNOSIS — Z7984 Long term (current) use of oral hypoglycemic drugs: Secondary | ICD-10-CM | POA: Insufficient documentation

## 2020-02-17 DIAGNOSIS — E079 Disorder of thyroid, unspecified: Secondary | ICD-10-CM | POA: Diagnosis not present

## 2020-02-17 DIAGNOSIS — Z79899 Other long term (current) drug therapy: Secondary | ICD-10-CM | POA: Diagnosis not present

## 2020-02-17 DIAGNOSIS — I10 Essential (primary) hypertension: Secondary | ICD-10-CM | POA: Diagnosis not present

## 2020-02-17 DIAGNOSIS — C73 Malignant neoplasm of thyroid gland: Secondary | ICD-10-CM | POA: Diagnosis not present

## 2020-02-17 DIAGNOSIS — C7989 Secondary malignant neoplasm of other specified sites: Secondary | ICD-10-CM | POA: Diagnosis not present

## 2020-02-17 DIAGNOSIS — C7800 Secondary malignant neoplasm of unspecified lung: Secondary | ICD-10-CM | POA: Insufficient documentation

## 2020-02-17 DIAGNOSIS — E119 Type 2 diabetes mellitus without complications: Secondary | ICD-10-CM | POA: Insufficient documentation

## 2020-02-17 DIAGNOSIS — C7951 Secondary malignant neoplasm of bone: Secondary | ICD-10-CM | POA: Diagnosis not present

## 2020-02-17 DIAGNOSIS — Z7982 Long term (current) use of aspirin: Secondary | ICD-10-CM | POA: Diagnosis not present

## 2020-02-17 DIAGNOSIS — R222 Localized swelling, mass and lump, trunk: Secondary | ICD-10-CM | POA: Diagnosis not present

## 2020-02-17 DIAGNOSIS — Z79890 Hormone replacement therapy (postmenopausal): Secondary | ICD-10-CM | POA: Diagnosis not present

## 2020-02-17 DIAGNOSIS — E785 Hyperlipidemia, unspecified: Secondary | ICD-10-CM | POA: Insufficient documentation

## 2020-02-17 DIAGNOSIS — C9000 Multiple myeloma not having achieved remission: Secondary | ICD-10-CM | POA: Diagnosis not present

## 2020-02-17 LAB — CBC
HCT: 36.1 % (ref 36.0–46.0)
Hemoglobin: 11.7 g/dL — ABNORMAL LOW (ref 12.0–15.0)
MCH: 27.9 pg (ref 26.0–34.0)
MCHC: 32.4 g/dL (ref 30.0–36.0)
MCV: 86 fL (ref 80.0–100.0)
Platelets: 172 10*3/uL (ref 150–400)
RBC: 4.2 MIL/uL (ref 3.87–5.11)
RDW: 12.5 % (ref 11.5–15.5)
WBC: 6.5 10*3/uL (ref 4.0–10.5)
nRBC: 0 % (ref 0.0–0.2)

## 2020-02-17 LAB — PROTIME-INR
INR: 1 (ref 0.8–1.2)
Prothrombin Time: 12.9 s (ref 11.4–15.2)

## 2020-02-17 LAB — GLUCOSE, CAPILLARY: Glucose-Capillary: 128 mg/dL — ABNORMAL HIGH (ref 70–99)

## 2020-02-17 MED ORDER — MIDAZOLAM HCL 2 MG/2ML IJ SOLN
INTRAMUSCULAR | Status: AC
  Filled 2020-02-17: qty 4

## 2020-02-17 MED ORDER — FENTANYL CITRATE (PF) 100 MCG/2ML IJ SOLN
INTRAMUSCULAR | Status: AC
  Filled 2020-02-17: qty 4

## 2020-02-17 MED ORDER — MIDAZOLAM HCL 2 MG/2ML IJ SOLN
INTRAMUSCULAR | Status: AC | PRN
  Administered 2020-02-17 (×2): 1 mg via INTRAVENOUS

## 2020-02-17 MED ORDER — LIDOCAINE HCL (PF) 1 % IJ SOLN
INTRAMUSCULAR | Status: AC | PRN
  Administered 2020-02-17: 10 mL

## 2020-02-17 MED ORDER — SODIUM CHLORIDE 0.9 % IV SOLN: INTRAVENOUS | Status: DC

## 2020-02-17 MED ORDER — FENTANYL CITRATE (PF) 100 MCG/2ML IJ SOLN
INTRAMUSCULAR | Status: AC | PRN
  Administered 2020-02-17: 50 ug via INTRAVENOUS

## 2020-02-17 NOTE — H&P (Signed)
Chief Complaint: Metasatiic cancer  Referring Physician(s): Chauncey Cruel  Supervising Physician: Jacqulynn Cadet  [facility]  History of Present Illness: Samantha Lozano is a 74 y.o. female who developed pleuritic chest pain on 01/10/2020.  CT showed innumerable bilateral pulmonary nodules, largest in the superior left lower lobe measuring 2.5 cm largest on the right side in the lower lobe at 1.9 cm and lytic lesions at T12, multiple ribs, and right humeral head.  CEA, CA 19-9, and CA 27-29 all within normal limits 01/13/2020.  She had a bone marrow biopsy on 01/15/2020 which was non diagnostic.  Her head CT done 01/21/2020 showed no evidence of meta metastases.  PET scan done 02/05/20 showed= 1. Unusual pattern of metastatic disease with multiple organ systems involved (pulmonary metastasis, bone metastasis, and muscle metastasis) and relatively moderate metabolic activity. Findings suggest a malignancy with relatively low FDG avidity such as thyroid carcinoma, renal cell carcinoma or myeloma. 2. Biopsy target sites could include the hypermetabolic soft tissue mass in the LEFT paraspinal musculature or one of the two hypermetabolic lytic lesions in the RIGHT iliac bone.  We are asked to perform a biopsy of the left paraspinal mass.  She is NPO. No blood thinners. No nausea/vomiting. No Fever/chills. ROS negative.     Past Medical History:  Diagnosis Date  . Diabetes mellitus, type 2 ([place])   . HLD (hyperlipidemia)   . HTN (hypertension)   . Leg edema   . Other specified disorders of thyroid     Past Surgical History:  Procedure Laterality Date  . ABDOMINAL HYSTERECTOMY    . THYROID SURGERY      Allergies: Lisinopril  Medications: Prior to Admission medications   Medication Sig Start Date End Date Taking? Authorizing Provider  aspirin EC 81 MG tablet Take 81 mg by mouth daily.   Yes [provider]  carvedilol  (COREG) 25 MG tablet Take 25 mg by mouth 2 (two) times daily with a meal.   Yes [provider]  glucose blood test strip Check BS twice daily 04/30/19  Yes Jearld Lesch A, DO  hydrochlorothiazide (HYDRODIURIL) 25 MG tablet Take 25 mg by mouth daily.   Yes [provider]  levothyroxine (SYNTHROID, LEVOTHROID) 125 MCG tablet Take 125 mcg by mouth daily before breakfast.   Yes [provider]  losartan (COZAAR) 50 MG tablet Take 1 tablet (50 mg total) by mouth daily. 10/27/19  Yes Whitmire, Dawn W, FNP  lovastatin (MEVACOR) 40 MG tablet Take 40 mg by mouth at bedtime.   Yes [provider]  metFORMIN (GLUCOPHAGE) 850 MG tablet Take 1 tablet (850 mg total) by mouth daily. with food 08/17/19  Yes Briscoe Deutscher, DO  Omega-3 Fatty Acids (FISH OIL) 435 MG CAPS Take 435 mg by mouth daily at 2 PM.    Yes [provider]  Vitamin D, Ergocalciferol, (DRISDOL) 1.25 MG (50000 UNIT) CAPS capsule Take 1 capsule (50,000 Units total) by mouth every 14 (fourteen) days. 10/13/19  Yes Whitmire, Dawn W, FNP  HYDROcodone-acetaminophen (NORCO/VICODIN) 5-325 MG tablet Take 1 tablet by mouth every 6 (six) hours as needed for moderate pain. 01/10/20   Virgel Manifold, MD     Family History  Problem Relation Age of Onset  . Diabetes Mother   . Hypertension Mother   . Cancer Mother   . Obesity Mother   . Hypertension Father   . Cancer Father   . Obesity Father  Social History   Socioeconomic History  . Marital status: Widowed    Spouse name: Not on file  . Number of children: 1  . Years of education: Not on file  . Highest education level: Not on file  Occupational History  . Occupation: Retired  Tobacco Use  . Smoking status: Never Smoker  . Smokeless tobacco: Never Used  Substance and Sexual Activity  . Alcohol use: Not Currently  . Drug use: Not Currently  . Sexual activity: Not on file  Other Topics Concern  . Not on file  Social History Narrative  . Not  on file   Social Determinants of Health   Financial Resource Strain:   . Difficulty of Paying Living Expenses:   Food Insecurity:   . Worried About Charity fundraiser in the Last Year:   . Arboriculturist in the Last Year:   Transportation Needs:   . Film/video editor (Medical):   [name] Lack of Transportation (Non-Medical):   Physical Activity:   . Days of Exercise per Week:   . Minutes of Exercise per Session:   Stress:   . Feeling of Stress :   Social Connections:   . Frequency of Communication with Friends and Family:   . Frequency of Social Gatherings with Friends and Family:   . Attends Religious Services:   . Active Member of Clubs or Organizations:   . Attends Archivist Meetings:   [name] Marital Status:      Review of Systems: A 12 point ROS discussed and pertinent positives are indicated in the HPI above.  All other systems are negative.  Review of Systems  Vital Signs: BP (!) 191/84 (BP Location: Right Arm)   Pulse (!) 58   Temp 98.3 F (36.8 C) (Oral)   Resp 18   SpO2 98%   Physical Exam Vitals reviewed.  Constitutional:      Appearance: She is obese.  HENT:     Head: Normocephalic and atraumatic.  Eyes:     Extraocular Movements: Extraocular movements intact.  Cardiovascular:     Rate and Rhythm: Normal rate and regular rhythm.  Pulmonary:     Effort: Pulmonary effort is normal. No respiratory distress.     Breath sounds: Normal breath sounds.  Abdominal:     General: There is no distension.     Palpations: Abdomen is soft.     Tenderness: There is no abdominal tenderness.  Musculoskeletal:        General: Normal range of motion.  Skin:    General: Skin is warm and dry.  Neurological:     General: No focal deficit present.     Mental Status: She is alert and oriented to person, place, and time.  Psychiatric:        Mood and Affect: Mood normal.        Behavior: Behavior normal.        Thought Content: Thought content normal.         Judgment: Judgment normal.     Imaging: CT Head W Wo Contrast  Result Date: 01/22/2020 CLINICAL DATA:  Lung cancer staging EXAM: CT HEAD WITHOUT AND WITH CONTRAST TECHNIQUE: Contiguous axial images were obtained from the base of the skull through the vertex without and with intravenous contrast CONTRAST:  128m OMNIPAQUE IOHEXOL 300 MG/ML  SOLN COMPARISON:  None available FINDINGS: Brain: No swelling or enhancement to suggest metastatic disease. No incidental infarct, hemorrhage, hydrocephalus, or collection. Vascular: Major vessels  are enhancing Skull: Negative for bone lesion Sinuses/Orbits: Bilateral cataract resection.  No orbital mass. IMPRESSION: Negative for metastatic disease. Electronically Signed   By: Monte Fantasia M.D.   On: 01/22/2020 08:46   NM PET Image Initial (PI) Skull Base To Thigh  Result Date: 02/05/2020 CLINICAL DATA:  Initial treatment strategy for carcinoma unknown primary. COVID vaccine LEFT arm EXAM: NUCLEAR MEDICINE PET SKULL BASE TO THIGH TECHNIQUE: 10.2 mCi F-18 FDG was injected intravenously. Full-ring PET imaging was performed from the skull base to thigh after the radiotracer. CT data was obtained and used for attenuation correction and anatomic localization. Portion the dose infiltrated in the RIGHT antecubital fossa. While there is intense activity in the arm, there is adequate radiotracer activity throughout the body to perform a diagnostic scan. Fasting blood glucose: 148 mg/dl COMPARISON:  Chest CT 220 FINDINGS: Mediastinal blood pool activity: SUV max 2.25 Liver activity: SUV max 3.6 NECK: No hypermetabolic lymph nodes in the neck. Incidental CT findings: none CHEST: Multiple round pulmonary nodules of varying size. These lesions of mixed metabolic activity. Some lesion without activity. Several lesions do have metabolic activity albeit moderate for size. For example, 1.8 cm lesion in the LEFT upper lobe (image 24) with SUV max equal 3.7. 1.7 cm nodule RIGHT lower  lobe SUV max equal 2.5. These the several small nodules with activity. For example LEFT upper lobe nodule measuring 0.9 cm with SUV max 2.0 Within the paraspinal musculature of the lower LEFT back (adjacent to the LEFT twelfth rib), there is soft tissue mass measuring 4.0 cm (image 120/4) with moderate metabolic activity (SUV max equal 3.9. Incidental CT findings: none ABDOMEN/PELVIS: No abnormal activity in liver. Adrenal glands are normal. No hypermetabolic activity associated with colon reproductive organs. Pancreas normal. Spleen normal. Incidental CT findings: Post hysterectomy.  Adnexa unremarkable. SKELETON: There is expansile lytic lesion within the medial RIGHT iliac bone measuring 3.5 cm (image 146/4) with SUV max equal 4.0. The small lytic lesion in the RIGHT iliac bone adjacent to the acetabulum. The soft tissue component measuring 1.5 cm SUV max equal 4. Mild radiotracer activity associated with a RIGHT lateral rib lesion with SUV max equal 3.0 on image 58. Incidental CT findings: Infiltration of radiotracer in the RIGHT arm IMPRESSION: 1. Unusual pattern of metastatic disease with multiple organ systems involved (pulmonary metastasis, bone metastasis, and muscle metastasis) and relatively moderate metabolic activity. Findings suggest a malignancy with relatively low FDG avidity such as thyroid carcinoma, renal cell carcinoma or myeloma. 2. Biopsy target sites could include the hypermetabolic soft tissue mass in the LEFT paraspinal musculature or one of the two hypermetabolic lytic lesions in the RIGHT iliac bone. 3. RIGHT arm radiotracer infiltration. Electronically Signed   By: Suzy Bouchard M.D.   On: 02/05/2020 09:32    Labs:  CBC: Recent Labs    01/10/20 1656 01/13/20 1204 01/15/20 0837  WBC 7.8 6.8 6.8  HGB 11.4* 11.6* 13.0  HCT 35.4* 36.2 39.8  PLT 203 208 215    COAGS: No results for input(s): INR, APTT in the last 8760 hours.  BMP: Recent Labs    01/10/20 1656  01/13/20 1204  NA 137 139  K 3.8 4.0  CL 102 102  CO2 21* 28  GLUCOSE 140* 124*  BUN 17 19  CALCIUM 9.0 9.6  CREATININE 0.77 0.99  GFRNONAA >60 57*  GFRAA >60 >60    LIVER FUNCTION TESTS: Recent Labs    01/13/20 1204  BILITOT 0.4  AST 16  ALT 12  ALKPHOS 91  PROT 7.6  ALBUMIN 3.9    TUMOR MARKERS: No results for input(s): AFPTM, CEA, CA199, CHROMGRNA in the last 8760 hours.  Assessment and Plan:  Metastatic cancer.   Will proceed with image guided biopsy of left paraspinal mass today by Dr. Laurence Ferrari.  Risks and benefits of biopsy of left paraspinal mass was discussed with the patient and/or patient's family including, but not limited to bleeding, infection, damage to adjacent structures or low yield requiring additional tests.  All of the questions were answered and there is agreement to proceed.  Consent signed and in chart.  Thank you for this interesting consult.  I greatly enjoyed meeting LISANDRA MATHISEN and look forward to participating in their care.  A copy of this report was sent to the requesting provider on this date.  Electronically Signed: Murrell Redden, PA-C   02/17/2020, 8:00 AM      I spent a total of  30 Minutes   in face to face in clinical consultation, greater than 50% of which was counseling/coordinating care for biopsy of paraspinal mass.

## 2020-02-17 NOTE — Procedures (Signed)
Interventional Radiology Procedure Note  Procedure: CT guided biopsy of left paraspinal soft tissue mass.   Complications: None  Estimated Blood Loss: None  Recommendations: - DC home  Signed,  Criselda Peaches, MD

## 2020-02-17 NOTE — Discharge Instructions (Signed)
Please call Interventional Radiology clinic [phone] with any questions or concerns.  You may remove your dressing and shower tomorrow.   Moderate Conscious Sedation, Adult, Care After These instructions provide you with information about caring for yourself after your procedure. Your health care provider may also give you more specific instructions. Your treatment has been planned according to current medical practices, but problems sometimes occur. Call your health care provider if you have any problems or questions after your procedure. What can I expect after the procedure? After your procedure, it is common:  To feel sleepy for several hours.  To feel clumsy and have poor balance for several hours.  To have poor judgment for several hours.  To vomit if you eat too soon. Follow these instructions at home: For at least 24 hours after the procedure:   Do not: ? Participate in activities where you could fall or become injured. ? Drive. ? Use heavy machinery. ? Drink alcohol. ? Take sleeping pills or medicines that cause drowsiness. ? Make important decisions or sign legal documents. ? Take care of children on your own.  Rest. Eating and drinking  Follow the diet recommended by your health care provider.  If you vomit: ? Drink water, juice, or soup when you can drink without vomiting. ? Make sure you have little or no nausea before eating solid foods. General instructions  Have a responsible adult stay with you until you are awake and alert.  Take over-the-counter and prescription medicines only as told by your health care provider.  If you smoke, do not smoke without supervision.  Keep all follow-up visits as told by your health care provider. This is important. Contact a health care provider if:  You keep feeling nauseous or you keep vomiting.  You feel light-headed.  You develop a rash.  You have a fever. Get help right away if:  You have trouble  breathing. This information is not intended to replace advice given to you by your health care provider. Make sure you discuss any questions you have with your health care provider. Document Revised: 08/09/2017 Document Reviewed: 12/17/2015 Elsevier Patient Education  [place].   Needle Biopsy, Care After These instructions tell you how to care for yourself after your procedure. Your doctor may also give you more specific instructions. Call your doctor if you have any problems or questions. What can I expect after the procedure? After the procedure, it is common to have:  Soreness.  Bruising.  Mild pain. Follow these instructions at home:   Return to your normal activities as told by your doctor. Ask your doctor what activities are safe for you.  Take over-the-counter and prescription medicines only as told by your doctor.  Wash your hands with soap and water before you change your bandage (dressing). If you cannot use soap and water, use hand sanitizer.  Follow instructions from your doctor about: ? How to take care of your puncture site. ? When and how to change your bandage. ? When to remove your bandage.  Check your puncture site every day for signs of infection. Watch for: ? Redness, swelling, or pain. ? Fluid or blood. ? Pus or a bad smell. ? Warmth.  Do not take baths, swim, or use a hot tub until your doctor approves. Ask your doctor if you may take showers. You may only be allowed to take sponge baths.  Keep all follow-up visits as told by your doctor. This is important. Contact a doctor if  you have:  A fever.  Redness, swelling, or pain at the puncture site, and it lasts longer than a few days.  Fluid, blood, or pus coming from the puncture site.  Warmth coming from the puncture site. Get help right away if:  You have a lot of bleeding from the puncture site. Summary  After the procedure, it is common to have soreness, bruising, or mild pain  at the puncture site.  Check your puncture site every day for signs of infection, such as redness, swelling, or pain.  Get help right away if you have severe bleeding from your puncture site. This information is not intended to replace advice given to you by your health care provider. Make sure you discuss any questions you have with your health care provider. Document Revised: 09/09/2017 Document Reviewed: 09/09/2017 Elsevier Patient Education  2020 [employer].

## 2020-02-23 LAB — SURGICAL PATHOLOGY

## 2020-02-26 ENCOUNTER — Emergency Department (HOSPITAL_COMMUNITY): Payer: PPO

## 2020-02-26 ENCOUNTER — Encounter: Payer: Self-pay | Admitting: Oncology

## 2020-02-26 ENCOUNTER — Inpatient Hospital Stay (HOSPITAL_COMMUNITY)
Admission: EM | Admit: 2020-02-26 | Discharge: 2020-03-02 | DRG: 948 | Disposition: A | Discharge status: Home / Self Care | Payer: PPO | Attending: Internal Medicine | Admitting: Internal Medicine

## 2020-02-26 ENCOUNTER — Other Ambulatory Visit: Payer: Self-pay

## 2020-02-26 ENCOUNTER — Inpatient Hospital Stay: Payer: PPO | Attending: Oncology | Admitting: Oncology

## 2020-02-26 ENCOUNTER — Encounter (HOSPITAL_COMMUNITY): Payer: Self-pay | Admitting: Internal Medicine

## 2020-02-26 DIAGNOSIS — E119 Type 2 diabetes mellitus without complications: Secondary | ICD-10-CM

## 2020-02-26 DIAGNOSIS — E039 Hypothyroidism, unspecified: Secondary | ICD-10-CM | POA: Diagnosis not present

## 2020-02-26 DIAGNOSIS — Z888 Allergy status to other drugs, medicaments and biological substances status: Secondary | ICD-10-CM

## 2020-02-26 DIAGNOSIS — I10 Essential (primary) hypertension: Secondary | ICD-10-CM | POA: Diagnosis present

## 2020-02-26 DIAGNOSIS — M170 Bilateral primary osteoarthritis of knee: Secondary | ICD-10-CM | POA: Diagnosis present

## 2020-02-26 DIAGNOSIS — E785 Hyperlipidemia, unspecified: Secondary | ICD-10-CM | POA: Diagnosis not present

## 2020-02-26 DIAGNOSIS — C7951 Secondary malignant neoplasm of bone: Secondary | ICD-10-CM

## 2020-02-26 DIAGNOSIS — Z03818 Encounter for observation for suspected exposure to other biological agents ruled out: Secondary | ICD-10-CM | POA: Diagnosis not present

## 2020-02-26 DIAGNOSIS — E559 Vitamin D deficiency, unspecified: Secondary | ICD-10-CM | POA: Diagnosis not present

## 2020-02-26 DIAGNOSIS — Z7982 Long term (current) use of aspirin: Secondary | ICD-10-CM | POA: Diagnosis not present

## 2020-02-26 DIAGNOSIS — G893 Neoplasm related pain (acute) (chronic): Secondary | ICD-10-CM | POA: Diagnosis not present

## 2020-02-26 DIAGNOSIS — Z79890 Hormone replacement therapy (postmenopausal): Secondary | ICD-10-CM | POA: Diagnosis not present

## 2020-02-26 DIAGNOSIS — Z7984 Long term (current) use of oral hypoglycemic drugs: Secondary | ICD-10-CM | POA: Diagnosis not present

## 2020-02-26 DIAGNOSIS — M545 Low back pain: Secondary | ICD-10-CM | POA: Diagnosis not present

## 2020-02-26 DIAGNOSIS — K5903 Drug induced constipation: Secondary | ICD-10-CM | POA: Diagnosis not present

## 2020-02-26 DIAGNOSIS — R52 Pain, unspecified: Secondary | ICD-10-CM | POA: Diagnosis not present

## 2020-02-26 DIAGNOSIS — C7800 Secondary malignant neoplasm of unspecified lung: Secondary | ICD-10-CM | POA: Diagnosis present

## 2020-02-26 DIAGNOSIS — Z833 Family history of diabetes mellitus: Secondary | ICD-10-CM

## 2020-02-26 DIAGNOSIS — Z515 Encounter for palliative care: Secondary | ICD-10-CM | POA: Diagnosis not present

## 2020-02-26 DIAGNOSIS — E1165 Type 2 diabetes mellitus with hyperglycemia: Secondary | ICD-10-CM | POA: Diagnosis not present

## 2020-02-26 DIAGNOSIS — T402X5A Adverse effect of other opioids, initial encounter: Secondary | ICD-10-CM | POA: Diagnosis not present

## 2020-02-26 DIAGNOSIS — Z794 Long term (current) use of insulin: Secondary | ICD-10-CM

## 2020-02-26 DIAGNOSIS — Z79899 Other long term (current) drug therapy: Secondary | ICD-10-CM | POA: Diagnosis not present

## 2020-02-26 DIAGNOSIS — Z8249 Family history of ischemic heart disease and other diseases of the circulatory system: Secondary | ICD-10-CM | POA: Diagnosis not present

## 2020-02-26 DIAGNOSIS — E538 Deficiency of other specified B group vitamins: Secondary | ICD-10-CM | POA: Diagnosis present

## 2020-02-26 DIAGNOSIS — M19071 Primary osteoarthritis, right ankle and foot: Secondary | ICD-10-CM | POA: Diagnosis not present

## 2020-02-26 DIAGNOSIS — Z20822 Contact with and (suspected) exposure to covid-19: Secondary | ICD-10-CM | POA: Diagnosis present

## 2020-02-26 DIAGNOSIS — C499 Malignant neoplasm of connective and soft tissue, unspecified: Secondary | ICD-10-CM | POA: Diagnosis present

## 2020-02-26 DIAGNOSIS — C801 Malignant (primary) neoplasm, unspecified: Secondary | ICD-10-CM | POA: Diagnosis not present

## 2020-02-26 DIAGNOSIS — C763 Malignant neoplasm of pelvis: Secondary | ICD-10-CM | POA: Diagnosis not present

## 2020-02-26 DIAGNOSIS — Z7189 Other specified counseling: Secondary | ICD-10-CM | POA: Diagnosis not present

## 2020-02-26 DIAGNOSIS — Z809 Family history of malignant neoplasm, unspecified: Secondary | ICD-10-CM | POA: Diagnosis not present

## 2020-02-26 DIAGNOSIS — M5489 Other dorsalgia: Secondary | ICD-10-CM | POA: Diagnosis not present

## 2020-02-26 DIAGNOSIS — Z6841 Body Mass Index (BMI) 40.0 and over, adult: Secondary | ICD-10-CM | POA: Diagnosis not present

## 2020-02-26 DIAGNOSIS — E876 Hypokalemia: Secondary | ICD-10-CM | POA: Diagnosis present

## 2020-02-26 DIAGNOSIS — M1711 Unilateral primary osteoarthritis, right knee: Secondary | ICD-10-CM | POA: Diagnosis not present

## 2020-02-26 DIAGNOSIS — C7989 Secondary malignant neoplasm of other specified sites: Secondary | ICD-10-CM

## 2020-02-26 LAB — CBC WITH DIFFERENTIAL/PLATELET
Abs Immature Granulocytes: 0.02 K/uL (ref 0.00–0.07)
Basophils Absolute: 0 K/uL (ref 0.0–0.1)
Basophils Relative: 0 %
Eosinophils Absolute: 0.2 K/uL (ref 0.0–0.5)
Eosinophils Relative: 3 %
HCT: 41.8 % (ref 36.0–46.0)
Hemoglobin: 13.6 g/dL (ref 12.0–15.0)
Immature Granulocytes: 0 %
Lymphocytes Relative: 28 %
Lymphs Abs: 2.2 K/uL (ref 0.7–4.0)
MCH: 27.8 pg (ref 26.0–34.0)
MCHC: 32.5 g/dL (ref 30.0–36.0)
MCV: 85.5 fL (ref 80.0–100.0)
Monocytes Absolute: 0.4 K/uL (ref 0.1–1.0)
Monocytes Relative: 5 %
Neutro Abs: 5.1 K/uL (ref 1.7–7.7)
Neutrophils Relative %: 64 %
Platelets: 138 K/uL — ABNORMAL LOW (ref 150–400)
RBC: 4.89 MIL/uL (ref 3.87–5.11)
RDW: 12.4 % (ref 11.5–15.5)
WBC: 8.1 K/uL (ref 4.0–10.5)
nRBC: 0 % (ref 0.0–0.2)

## 2020-02-26 LAB — COMPREHENSIVE METABOLIC PANEL WITH GFR
ALT: 20 U/L (ref 0–44)
AST: 22 U/L (ref 15–41)
Albumin: 4.9 g/dL (ref 3.5–5.0)
Alkaline Phosphatase: 88 U/L (ref 38–126)
Anion gap: 13 (ref 5–15)
BUN: 14 mg/dL (ref 8–23)
CO2: 24 mmol/L (ref 22–32)
Calcium: 9.7 mg/dL (ref 8.9–10.3)
Chloride: 102 mmol/L (ref 98–111)
Creatinine, Ser: 0.65 mg/dL (ref 0.44–1.00)
GFR calc Af Amer: >60 mL/min
GFR calc non Af Amer: >60 mL/min
Glucose, Bld: 125 mg/dL — ABNORMAL HIGH (ref 70–99)
Potassium: 3.5 mmol/L (ref 3.5–5.1)
Sodium: 139 mmol/L (ref 135–145)
Total Bilirubin: 1 mg/dL (ref 0.3–1.2)
Total Protein: 8.8 g/dL — ABNORMAL HIGH (ref 6.5–8.1)

## 2020-02-26 LAB — GLUCOSE, CAPILLARY: Glucose-Capillary: 123 mg/dL — ABNORMAL HIGH (ref 70–99)

## 2020-02-26 LAB — SARS CORONAVIRUS 2 BY RT PCR (HOSPITAL ORDER, PERFORMED IN ~~LOC~~ HOSPITAL LAB): SARS Coronavirus 2: NEGATIVE

## 2020-02-26 LAB — CK: Total CK: 133 U/L (ref 38–234)

## 2020-02-26 MED ORDER — ONDANSETRON HCL 4 MG/2ML IJ SOLN: 4.0000 mg | Freq: Four times a day (QID) | INTRAMUSCULAR | Status: DC | PRN

## 2020-02-26 MED ORDER — ACETAMINOPHEN 500 MG PO TABS
1000.0000 mg | ORAL_TABLET | Freq: Three times a day (TID) | ORAL | Status: DC
  Administered 2020-02-26 – 2020-03-02 (×15): 1000 mg via ORAL
  Filled 2020-02-26 (×15): qty 2

## 2020-02-26 MED ORDER — ENOXAPARIN SODIUM 40 MG/0.4ML ~~LOC~~ SOLN
40.0000 mg | SUBCUTANEOUS | Status: DC
  Administered 2020-02-26 – 2020-03-01 (×5): 40 mg via SUBCUTANEOUS
  Filled 2020-02-26 (×5): qty 0.4

## 2020-02-26 MED ORDER — LORAZEPAM 2 MG/ML IJ SOLN
0.5000 mg | Freq: Once | INTRAMUSCULAR | Status: AC
  Administered 2020-02-26: 0.5 mg via INTRAVENOUS
  Filled 2020-02-26: qty 1

## 2020-02-26 MED ORDER — KETOROLAC TROMETHAMINE 30 MG/ML IJ SOLN
15.0000 mg | Freq: Once | INTRAMUSCULAR | Status: AC
  Administered 2020-02-26: 15 mg via INTRAVENOUS
  Filled 2020-02-26: qty 1

## 2020-02-26 MED ORDER — OMEGA-3-ACID ETHYL ESTERS 1 G PO CAPS
1.0000 g | ORAL_CAPSULE | Freq: Every day | ORAL | Status: DC
  Administered 2020-02-27 – 2020-03-02 (×5): 1 g via ORAL
  Filled 2020-02-26 (×5): qty 1

## 2020-02-26 MED ORDER — LOSARTAN POTASSIUM 50 MG PO TABS
50.0000 mg | ORAL_TABLET | Freq: Every day | ORAL | Status: DC
  Administered 2020-02-26 – 2020-03-02 (×6): 50 mg via ORAL
  Filled 2020-02-26 (×6): qty 1

## 2020-02-26 MED ORDER — SODIUM CHLORIDE 0.9 % IV SOLN
INTRAVENOUS | Status: DC
  Administered 2020-02-26: 10 mL/h via INTRAVENOUS

## 2020-02-26 MED ORDER — PRAVASTATIN SODIUM 20 MG PO TABS
40.0000 mg | ORAL_TABLET | Freq: Every day | ORAL | Status: DC
  Administered 2020-02-27 – 2020-03-01 (×4): 40 mg via ORAL
  Filled 2020-02-26 (×4): qty 2

## 2020-02-26 MED ORDER — BISACODYL 5 MG PO TBEC
5.0000 mg | DELAYED_RELEASE_TABLET | Freq: Every day | ORAL | Status: DC | PRN
  Filled 2020-02-26: qty 1

## 2020-02-26 MED ORDER — MORPHINE SULFATE (PF) 4 MG/ML IV SOLN
4.0000 mg | Freq: Once | INTRAVENOUS | Status: AC
  Administered 2020-02-26: 4 mg via INTRAVENOUS
  Filled 2020-02-26: qty 1

## 2020-02-26 MED ORDER — HYDROCHLOROTHIAZIDE 25 MG PO TABS
25.0000 mg | ORAL_TABLET | Freq: Every day | ORAL | Status: DC
  Administered 2020-02-26 – 2020-03-02 (×6): 25 mg via ORAL
  Filled 2020-02-26 (×6): qty 1

## 2020-02-26 MED ORDER — SENNA 8.6 MG PO TABS
1.0000 | ORAL_TABLET | Freq: Two times a day (BID) | ORAL | Status: DC
  Administered 2020-02-26 – 2020-02-27 (×3): 8.6 mg via ORAL
  Filled 2020-02-26 (×3): qty 1

## 2020-02-26 MED ORDER — HYDROMORPHONE HCL 2 MG/ML IJ SOLN
2.0000 mg | INTRAMUSCULAR | Status: DC | PRN
  Administered 2020-02-26 – 2020-02-27 (×5): 2 mg via INTRAVENOUS
  Filled 2020-02-26 (×6): qty 1

## 2020-02-26 MED ORDER — SODIUM CHLORIDE 0.9 % IV SOLN: INTRAVENOUS | Status: DC

## 2020-02-26 MED ORDER — CARVEDILOL 25 MG PO TABS
25.0000 mg | ORAL_TABLET | Freq: Two times a day (BID) | ORAL | Status: DC
  Administered 2020-02-26 – 2020-03-02 (×11): 25 mg via ORAL
  Filled 2020-02-26: qty 1
  Filled 2020-02-26: qty 2
  Filled 2020-02-26 (×5): qty 1
  Filled 2020-02-26: qty 2
  Filled 2020-02-26 (×3): qty 1

## 2020-02-26 MED ORDER — GADOBUTROL 1 MMOL/ML IV SOLN
10.0000 mL | Freq: Once | INTRAVENOUS | Status: AC | PRN
  Administered 2020-02-26: 10 mL via INTRAVENOUS

## 2020-02-26 MED ORDER — HYDRALAZINE HCL 25 MG PO TABS: 25.0000 mg | ORAL_TABLET | Freq: Four times a day (QID) | ORAL | Status: DC | PRN

## 2020-02-26 MED ORDER — MORPHINE SULFATE (PF) 4 MG/ML IV SOLN
6.0000 mg | Freq: Once | INTRAVENOUS | Status: AC
  Administered 2020-02-26: 6 mg via INTRAVENOUS
  Filled 2020-02-26: qty 2

## 2020-02-26 MED ORDER — MAGNESIUM CITRATE PO SOLN: 1.0000 | Freq: Once | ORAL | Status: DC | PRN

## 2020-02-26 MED ORDER — POLYETHYLENE GLYCOL 3350 17 G PO PACK: 17.0000 g | PACK | Freq: Every day | ORAL | Status: DC | PRN

## 2020-02-26 MED ORDER — ONDANSETRON HCL 4 MG PO TABS: 4.0000 mg | ORAL_TABLET | Freq: Four times a day (QID) | ORAL | Status: DC | PRN

## 2020-02-26 MED ORDER — OXYCODONE HCL 5 MG PO TABS: 5.0000 mg | ORAL_TABLET | ORAL | Status: DC | PRN

## 2020-02-26 MED ORDER — METHOCARBAMOL 500 MG PO TABS
1000.0000 mg | ORAL_TABLET | Freq: Four times a day (QID) | ORAL | Status: DC
  Administered 2020-02-26 – 2020-03-02 (×19): 1000 mg via ORAL
  Filled 2020-02-26 (×19): qty 2

## 2020-02-26 MED ORDER — INSULIN ASPART 100 UNIT/ML ~~LOC~~ SOLN
0.0000 [IU] | Freq: Three times a day (TID) | SUBCUTANEOUS | Status: DC
  Administered 2020-02-27: 1 [IU] via SUBCUTANEOUS
  Administered 2020-02-28: 2 [IU] via SUBCUTANEOUS
  Administered 2020-02-28: 1 [IU] via SUBCUTANEOUS
  Administered 2020-02-29 – 2020-03-01 (×6): 2 [IU] via SUBCUTANEOUS
  Administered 2020-03-02: 1 [IU] via SUBCUTANEOUS
  Administered 2020-03-02: 2 [IU] via SUBCUTANEOUS
  Filled 2020-02-26: qty 0.09

## 2020-02-26 MED ORDER — TRAZODONE HCL 50 MG PO TABS: 50.0000 mg | ORAL_TABLET | Freq: Every evening | ORAL | Status: DC | PRN

## 2020-02-26 MED ORDER — OXYCODONE HCL 5 MG PO TABS
10.0000 mg | ORAL_TABLET | ORAL | Status: DC | PRN
  Administered 2020-02-27 (×3): 10 mg via ORAL
  Filled 2020-02-26 (×4): qty 2

## 2020-02-26 MED ORDER — METHOCARBAMOL 1000 MG/10ML IJ SOLN
500.0000 mg | Freq: Once | INTRAVENOUS | Status: AC
  Administered 2020-02-26: 500 mg via INTRAVENOUS
  Filled 2020-02-26: qty 500

## 2020-02-26 MED ORDER — ASPIRIN EC 81 MG PO TBEC
81.0000 mg | DELAYED_RELEASE_TABLET | Freq: Every day | ORAL | Status: DC
  Administered 2020-02-27 – 2020-03-02 (×5): 81 mg via ORAL
  Filled 2020-02-26 (×5): qty 1

## 2020-02-26 MED ORDER — LEVOTHYROXINE SODIUM 25 MCG PO TABS
125.0000 ug | ORAL_TABLET | Freq: Every day | ORAL | Status: DC
  Administered 2020-02-27 – 2020-03-01 (×4): 125 ug via ORAL
  Filled 2020-02-26 (×6): qty 1

## 2020-02-26 NOTE — ED Provider Notes (Signed)
[facility] DEPT Provider Note   CSN: [id number] Arrival date & time: 02/26/20  4098     History No chief complaint on file.   Samantha Lozano is a 74 y.o. female.  74 year old female presents with severe right-sided mid back pain radiating down her right leg.  Pain is atraumatic and characterizes dull and worse with any movements.  Medicated with hydrocodone at home without relief.  Pain is been actually there for several days and she has had to use a walker to get around.  No bowel or bladder dysfunction.  Denies any rashes to the area.  Patient has a history of cancer but they are unsure of the etiology.  Does note some new pain to her right lower extremity.  Transported by EMS        Past Medical History:  Diagnosis Date  . Diabetes mellitus, type 2 ([place])   . HLD (hyperlipidemia)   . HTN (hypertension)   . Leg edema   . Other specified disorders of thyroid     Patient Active Problem List   Diagnosis Date Noted  . Bone metastases ([place]) 01/22/2020  . Lung metastases ([place]) 01/13/2020  . Osteoarthritis of both knees 12/14/2019  . Pain in both knees 11/26/2019  . Essential hypertension 09/16/2019  . Depression 07/06/2019  . Class 2 severe obesity with serious comorbidity and body mass index (BMI) of 36.0 to 36.9 in adult [facility]) 07/30/2018  . Other fatigue 03/10/2018  . Shortness of breath on exertion 03/10/2018  . Type 2 diabetes mellitus without complication, without long-term current use of insulin ([place]) 03/10/2018  . Abnormal EKG 03/10/2018  . Vitamin D deficiency 03/10/2018  . B12 nutritional deficiency 03/10/2018    Past Surgical History:  Procedure Laterality Date  . ABDOMINAL HYSTERECTOMY    . THYROID SURGERY       OB History    Gravida  1   Para      Term      Preterm      AB      Living  1     SAB      TAB      Ectopic      Multiple      Live Births              Family History  Problem Relation  Age of Onset  . Diabetes Mother   . Hypertension Mother   . Cancer Mother   . Obesity Mother   . Hypertension Father   . Cancer Father   . Obesity Father     Social History   Tobacco Use  . Smoking status: Never Smoker  . Smokeless tobacco: Never Used  Substance Use Topics  . Alcohol use: Not Currently  . Drug use: Not Currently    Home Medications Prior to Admission medications   Medication Sig Start Date End Date Taking? Authorizing Provider  aspirin EC 81 MG tablet Take 81 mg by mouth daily.    [provider]  carvedilol (COREG) 25 MG tablet Take 25 mg by mouth 2 (two) times daily with a meal.    [provider]  glucose blood test strip Check BS twice daily 04/30/19   Jearld Lesch A, DO  hydrochlorothiazide (HYDRODIURIL) 25 MG tablet Take 25 mg by mouth daily.    [provider]  HYDROcodone-acetaminophen (NORCO/VICODIN) 5-325 MG tablet Take 1 tablet by mouth every 6 (six) hours as needed for moderate pain. 01/10/20  Virgel Manifold, MD  levothyroxine (SYNTHROID, LEVOTHROID) 125 MCG tablet Take 125 mcg by mouth daily before breakfast.    [provider]  losartan (COZAAR) 50 MG tablet Take 1 tablet (50 mg total) by mouth daily. 10/27/19   Whitmire, Joneen Boers, FNP  lovastatin (MEVACOR) 40 MG tablet Take 40 mg by mouth at bedtime.    [provider]  metFORMIN (GLUCOPHAGE) 850 MG tablet Take 1 tablet (850 mg total) by mouth daily. with food 08/17/19   Briscoe Deutscher, DO  Omega-3 Fatty Acids (FISH OIL) 435 MG CAPS Take 435 mg by mouth daily at 2 PM.     [provider]  Vitamin D, Ergocalciferol, (DRISDOL) 1.25 MG (50000 UNIT) CAPS capsule Take 1 capsule (50,000 Units total) by mouth every 14 (fourteen) days. 10/13/19   Whitmire, Joneen Boers, FNP    Allergies    Lisinopril  Review of Systems   Review of Systems  All other systems reviewed and are negative.   Physical Exam Updated Vital Signs BP (!) 190/101 (BP Location: Left Arm)    Pulse 96   Temp 98.1 F (36.7 C) (Oral)   Resp 19   Ht 1.575 m (5\' 2" )   Wt 99.8 kg   SpO2 97%   BMI 40.24 kg/m   Physical Exam Vitals and nursing note reviewed.  Constitutional:      General: She is not in acute distress.    Appearance: Normal appearance. She is well-developed. She is not toxic-appearing.  HENT:     Head: Normocephalic and atraumatic.  Eyes:     General: Lids are normal.     Conjunctiva/sclera: Conjunctivae normal.     Pupils: Pupils are equal, round, and reactive to light.  Neck:     Thyroid: No thyroid mass.     Trachea: No tracheal deviation.  Cardiovascular:     Rate and Rhythm: Normal rate and regular rhythm.     Heart sounds: Normal heart sounds. No murmur heard.  No gallop.   Pulmonary:     Effort: Pulmonary effort is normal. No respiratory distress.     Breath sounds: Normal breath sounds. No stridor. No decreased breath sounds, wheezing, rhonchi or rales.  Abdominal:     General: Bowel sounds are normal. There is no distension.     Palpations: Abdomen is soft.     Tenderness: There is no abdominal tenderness. There is no rebound.  Musculoskeletal:        General: Normal range of motion.     Cervical back: Normal range of motion and neck supple.     Thoracic back: Spasms and tenderness present.     Lumbar back: Spasms and tenderness present.       Back:       Legs:  Skin:    General: Skin is warm and dry.     Findings: No abrasion or rash.  Neurological:     Mental Status: She is alert and oriented to person, place, and time.     GCS: GCS eye subscore is 4. GCS verbal subscore is 5. GCS motor subscore is 6.     Cranial Nerves: No cranial nerve deficit.     Sensory: No sensory deficit.  Psychiatric:        Speech: Speech normal.        Behavior: Behavior normal.     ED Results / Procedures / Treatments   Labs (all labs ordered are listed, but only abnormal results are displayed) Labs Reviewed  CBC WITH DIFFERENTIAL/PLATELET    COMPREHENSIVE METABOLIC PANEL  CK    EKG None  Radiology No results found.  Procedures Procedures (including critical care time)  Medications Ordered in ED Medications  0.9 %  sodium chloride infusion (has no administration in time range)  LORazepam (ATIVAN) injection 0.5 mg (has no administration in time range)  morphine 4 MG/ML injection 4 mg (has no administration in time range)    ED Course  I have reviewed the triage vital signs and the nursing notes.  Pertinent labs & imaging results that were available during my care of the patient were reviewed by me and considered in my medical decision making (see chart for details).    MDM Rules/Calculators/A&P                          Patient given multiple rounds of pain medication here without resolve.  X-rays of T and L-spine showed no new metastatic disease.  Patient has a history of known bony mets.  Patient's pain seemed to be more localized to her right hip and had MRI which progressive osseous metastatic disease throughout the bony pelvis and proximal femur.  This is likely the source of her pain.  Patient seen by her oncologist, Dr. Jana Hakim, who is deciding on what his next dose will be.  Patient will need to be admitted for pain management Final Clinical Impression(s) / ED Diagnoses Final diagnoses:  None    Rx / DC Orders ED Discharge Orders    None       Lacretia Leigh, MD 02/26/20 1531

## 2020-02-26 NOTE — ED Provider Notes (Signed)
Signout from Dr. Zenia Resides.  74 year old female with history of metastatic cancer here with increased pain right hip right leg.  Imaging shows new lytic lesions.  Will need admission for pain control. Physical Exam  BP (!) 145/74   Pulse 70   Temp 98.1 F (36.7 C) (Oral)   Resp 18   Ht 5\' 2"  (1.575 m)   Wt 99.8 kg   SpO2 94%   BMI 40.24 kg/m   Physical Exam  ED Course/Procedures     Procedures  MDM  Discussed with [facility] hospitalist Dr. Arbutus Ped who accepts the patient for admission.       Hayden Rasmussen, MD 02/26/20 [phone]

## 2020-02-26 NOTE — H&P (Signed)
History and Physical    Samantha Lozano ZOX:[id number] DOB: [birth year] DOA: 02/26/2020  PCP: Orpah Melter, MD  Patient coming from: Home  I have personally briefly reviewed patient's old medical records in [place]  Chief Complaint: Right hip pain  HPI: Samantha Lozano is a 74 y.o. female with medical history significant of widespread metastatic disease with very recent tissue diagnosis of leiomyosarcoma grade 1, hypertension, hyperlipidemia, type 2 diabetes, obesity, osteoarthritis who presented to the ED today for progressive right hip pain.  Patient states that she was doing some yard work, radiating this past Wednesday (2 days ago) and has since had right hip pain that progressed to the point she was needing to use a walker for ambulation.  She typically does not require assist device.  She is also reporting right ankle and knee pain in addition to her back which she states is better than earlier today.  Patient and daughter at bedside report that they were just informed today of patient's tissue diagnosis and widespread metastatic disease and new lytic bony lesions are likely the source of her pain.  Patient has otherwise been in good health recently with no recent illnesses.  She denies fevers or chills, chest pain, shortness of breath, abdominal pain, nausea vomiting or diarrhea, dysuria or urinary frequency, headaches, focal weakness, vision changes or other recent symptoms.   ED Course: Afebrile, heart rate 96, respirations 19, BP 190/101, satting 97% on room air.  CBC showed platelets 138.  BMP notable only for mild hyperglycemia.  MRI of right hip impression below:  1. Progressive widespread osseous metastatic disease throughout the bony pelvis and proximal right femur. No definite pathologic fracture at this time. 2. Large lesion involving the right iliac CREST with extraosseous soft tissue extension. The location of the lesion at the right anterior inferior iliac spine  predisposes to avulsive injury of the rectus femoris tendon. 3. 1.7 cm subcutaneous metastasis superficial to the right tensor fascia lata muscle. 4. Partial tear of the left common hamstring tendon  Patient is admitted to hospitalist service for pain management requiring IV medications and PT evaluation.  Review of Systems: As per HPI otherwise 10 point review of systems negative.    Past Medical History:  Diagnosis Date  . Diabetes mellitus, type 2 ([place])   . HLD (hyperlipidemia)   . HTN (hypertension)   . Leg edema   . Other specified disorders of thyroid     Past Surgical History:  Procedure Laterality Date  . ABDOMINAL HYSTERECTOMY    . THYROID SURGERY       reports that she has never smoked. She has never used smokeless tobacco. She reports previous alcohol use. She reports previous drug use.  Allergies  Allergen Reactions  . Lisinopril Cough    Family History  Problem Relation Age of Onset  . Diabetes Mother   . Hypertension Mother   . Cancer Mother   . Obesity Mother   . Hypertension Father   . Cancer Father   . Obesity Father      Prior to Admission medications   Medication Sig Start Date End Date Taking? Authorizing Provider  aspirin EC 81 MG tablet Take 81 mg by mouth daily.   Yes [provider]  carvedilol (COREG) 25 MG tablet Take 25 mg by mouth 2 (two) times daily with a meal.   Yes [provider]  hydrochlorothiazide (HYDRODIURIL) 25 MG tablet Take 25 mg by mouth daily.   Yes [provider]  HYDROcodone-acetaminophen (NORCO/VICODIN) 5-325 MG tablet Take 1 tablet by mouth every 6 (six) hours as needed for moderate pain. 01/10/20  Yes Virgel Manifold, MD  levothyroxine (SYNTHROID, LEVOTHROID) 125 MCG tablet Take 125 mcg by mouth daily before breakfast.   Yes [provider]  lovastatin (MEVACOR) 40 MG tablet Take 40 mg by mouth at bedtime.   Yes [provider]  metFORMIN (GLUCOPHAGE) 850 MG tablet Take 1  tablet (850 mg total) by mouth daily. with food 08/17/19  Yes Briscoe Deutscher, DO  Omega-3 Fatty Acids (FISH OIL) 435 MG CAPS Take 435 mg by mouth daily at 2 PM.    Yes [provider]  glucose blood test strip Check BS twice daily 04/30/19   Jearld Lesch A, DO  losartan (COZAAR) 50 MG tablet Take 1 tablet (50 mg total) by mouth daily. Patient not taking: Reported on 02/26/2020 10/27/19   [place], Joneen Boers, FNP  Vitamin D, Ergocalciferol, (DRISDOL) 1.25 MG (50000 UNIT) CAPS capsule Take 1 capsule (50,000 Units total) by mouth every 14 (fourteen) days. Patient not taking: Reported on 02/26/2020 10/13/19   Georgianne Fick, FNP    Physical Exam: Vitals:   02/26/20 1714 02/26/20 1744 02/26/20 1814 02/26/20 1830  BP: 129/77 (!) 160/75 130/81 (!) 148/76  Pulse: 75 72 77 78  Resp: 16 16 18 14   Temp:      TempSrc:      SpO2: 93% 92% 92% 92%  Weight:      Height:         Constitutional: NAD, calm, appears uncomfortable Eyes: EOMI, lids and conjunctivae normal ENMT: Mucous membranes are moist. Posterior pharynx clear of any exudate or lesions.Normal dentition.  Hearing grossly normal. Respiratory: CTAB, no wheezing, no crackles. Normal respiratory effort. No accessory muscle use.  Cardiovascular: RRR, no murmurs / rubs / gallops. No extremity edema. 2+ pedal pulses. No carotid bruits.  Abdomen: soft, NT, ND, no masses or HSM palpated. +Bowel sounds.  Musculoskeletal: Tenderness on palpation and mild swelling of the right lateral ankle with no ecchymosis seen.  Right knee tender on palpation.  Right hip not palpated for patient comfort. Skin: dry, intact, normal temperature Neurologic: CN 2-12 grossly intact. Normal speech.  Grossly non-focal exam. Psychiatric: Alert and oriented x 3. Normal mood. Congruent affect.  Normal judgement and insight.    Labs on Admission: I have personally reviewed following labs and imaging studies  CBC: Recent Labs  Lab 02/26/20 0938  WBC 8.1  NEUTROABS  5.1  HGB 13.6  HCT 41.8  MCV 85.5  PLT 979*   Basic Metabolic Panel: Recent Labs  Lab 02/26/20 0938  NA 139  K 3.5  CL 102  CO2 24  GLUCOSE 125*  BUN 14  CREATININE 0.65  CALCIUM 9.7   GFR: Estimated Creatinine Clearance: 70.2 mL/min (by C-G formula based on SCr of 0.65 mg/dL). Liver Function Tests: Recent Labs  Lab 02/26/20 0938  AST 22  ALT 20  ALKPHOS 88  BILITOT 1.0  PROT 8.8*  ALBUMIN 4.9   No results for input(s): LIPASE, AMYLASE in the last 168 hours. No results for input(s): AMMONIA in the last 168 hours. Coagulation Profile: No results for input(s): INR, PROTIME in the last 168 hours. Cardiac Enzymes: Recent Labs  Lab 02/26/20 0938  CKTOTAL 133   BNP (last 3 results) No results for input(s): PROBNP in the last 8760 hours. HbA1C: No results for input(s): HGBA1C in the last 72 hours. CBG: No results for input(s):  GLUCAP in the last 168 hours. Lipid Profile: No results for input(s): CHOL, HDL, LDLCALC, TRIG, CHOLHDL, LDLDIRECT in the last 72 hours. Thyroid Function Tests: No results for input(s): TSH, T4TOTAL, FREET4, T3FREE, THYROIDAB in the last 72 hours. Anemia Panel: No results for input(s): VITAMINB12, FOLATE, FERRITIN, TIBC, IRON, RETICCTPCT in the last 72 hours. Urine analysis: No results found for: COLORURINE, APPEARANCEUR, LABSPEC, PHURINE, GLUCOSEU, HGBUR, BILIRUBINUR, KETONESUR, PROTEINUR, UROBILINOGEN, NITRITE, LEUKOCYTESUR  Radiological Exams on Admission: DG Thoracic Spine 4V  Result Date: 02/26/2020 CLINICAL DATA:  Known metastatic disease. Unknown primary. Sudden onset of mid to low back pain extending into the right lower extremity. EXAM: THORACIC SPINE - 4+ VIEW COMPARISON:  CT of the chest 01/10/2020 FINDINGS: 12 rib-bearing thoracic type vertebral bodies are present. Progressive diffuse pulmonary metastases are noted. Vertebral body heights maintained. No acute or healing fractures are present. Degenerative endplate changes are  noted throughout the thoracic spine. Sternum and visualized ribs are within normal limits. Degenerative changes are present the lower cervical spine as well. IMPRESSION: 1. Progressive diffuse pulmonary metastases. 2. No acute or healing fractures. 3. Degenerative changes throughout the thoracic and cervical spine. Electronically Signed   By: San Morelle M.D.   On: 02/26/2020 09:28   DG Lumbar Spine Complete  Result Date: 02/26/2020 CLINICAL DATA:  Severe right sided mid back pain and leg pain beginning at 1 a.m. Known metastatic disease the bone. EXAM: LUMBAR SPINE - COMPLETE 4+ VIEW COMPARISON:  PET scan 02/05/2020. FINDINGS: Five non rib-bearing lumbar type vertebral bodies are present. Vertebral body heights are maintained. No acute or healing fractures are present. No significant listhesis is present. Advanced degenerative changes are noted in the lower lumbar facets. Metastatic disease is less apparent by plain film radiographs. Soft tissues are unremarkable. Atherosclerotic calcifications are present. IMPRESSION: 1. Advanced degenerative changes in the lower lumbar facets without acute or healing fractures. 2. Metastatic disease is less apparent by plain film radiographs. Electronically Signed   By: San Morelle M.D.   On: 02/26/2020 09:25   DG Tibia/Fibula Right  Result Date: 02/26/2020 CLINICAL DATA:  Back pain radiating into the right lower leg. No known injury. EXAM: RIGHT TIBIA AND FIBULA - 2 VIEW COMPARISON:  None. FINDINGS: There is no acute bony or joint abnormality. Osteoarthritis about the knee is most severe in the patellofemoral compartment. Degenerative change at the tibiotalar joint and articulations of the midfoot is also identified. Calcaneal spurring is present. Soft tissues are unremarkable. IMPRESSION: No acute abnormality. Multifocal osteoarthritis. Electronically Signed   By: Inge Rise M.D.   On: 02/26/2020 09:24   MR HIP RIGHT W WO CONTRAST  Result  Date: 02/26/2020 CLINICAL DATA:  Severe right mid back pain radiating into the right leg today. No acute injury. Metastatic carcinoma of unknown primary. EXAM: MRI OF THE RIGHT HIP WITHOUT AND WITH CONTRAST TECHNIQUE: Multiplanar, multisequence MR imaging was performed both before and after administration of intravenous contrast. CONTRAST:  47mL GADAVIST GADOBUTROL 1 MMOL/ML IV SOLN COMPARISON:  PET-CT 02/05/2020. FINDINGS: Despite efforts by the technologist and patient, mild motion artifact is present on today's exam and could not be eliminated. This reduces exam sensitivity and specificity. Bones: There is widespread osseous metastatic disease which appears progressive compared with previous PET-CT. Largest lesions include a 3.3 cm in the subtrochanteric region of the proximal right femur (image 14/6) and a 2.1 cm lesion near the right anterior inferior iliac spine (image 5/6). Additional lesions are present throughout the bony pelvis, including bilateral sacral  lesions and prominent involvement of the L5 vertebral body on the right. There is a large lesion involving the right iliac crest with extraosseous soft tissue extension, measuring up to 4.8 cm on image 10/4. There is a small lesion in the proximal left femoral diaphysis. No definite pathologic fracture at this time. Articular cartilage and labrum Articular cartilage: No focal chondral defect or subchondral signal abnormality identified. Labrum: There is no gross labral tear or paralabral abnormality. Joint or bursal effusion Joint effusion: No significant hip joint effusion. Bursae: No focal periarticular fluid collection. Muscles and tendons Muscles and tendons: Partial tear of the left common hamstring tendon. The gluteus and iliopsoas tendons appear intact. The location of the lesion at the right anterior inferior iliac spine predisposes to avulsive injury of the rectus femoris tendon. Other findings Miscellaneous: 1.7 cm subcutaneous metastasis noted  superficial to the right tensor fascia lata muscle on image 5/6. IMPRESSION: 1. Progressive widespread osseous metastatic disease throughout the bony pelvis and proximal right femur. No definite pathologic fracture at this time. 2. Large lesion involving the right iliac CREST with extraosseous soft tissue extension. The location of the lesion at the right anterior inferior iliac spine predisposes to avulsive injury of the rectus femoris tendon. 3. 1.7 cm subcutaneous metastasis superficial to the right tensor fascia lata muscle. 4. Partial tear of the left common hamstring tendon. Electronically Signed   By: Richardean Sale M.D.   On: 02/26/2020 15:16    EKG: none   Assessment/Plan Principal Problem:   Intractable pain Active Problems:   Bony metastasis (HCC)   Leiomyosarcoma (HCC)   Type 2 diabetes mellitus (HCC)   Essential hypertension   Hypothyroidism    Intractable pain due to bony metastatic disease - POA.  Most pain currently in right hip, ankle, knee, back.   --scheduled Tylenol and Robaxin (pt got most relief today in ED after Robaxin) --PRN oxycodone 5 mg for moderate, 10mg  for severe pain --IV Dilaudid for breakthrough pain --Bowel regimen --gentle IV fluids --PT evaluation tomorrow --Palliative care consulted  Leiomyosarcoma - followed by oncologist Dr. Jana Hakim.  Considering palliative radiation for bony mets and pain relief. --Oncology is following  Type 2 diabetes mellitus - on Metformin, held.  Sliding scale Novolog.    Essential hypertension - Uncontrolled on admission likely due to pain.  Continue home Coreg and HCTZ.  No longer taking losartan.  PRN oral hydralazine if uncontrolled.  Hypothyroidism - continue Synthroid   DVT prophylaxis: enoxaparin (LOVENOX) injection 40 mg Start: 02/26/20 1830   Code Status: Full Discussed in detail with patient, daughter present at bedside.  Family Communication: daughter at bedside  Disposition Plan: expect d/c home once  pain controlled on PO medications  Consults called: oncology   Admission status:  Status is: Inpatient  Remains inpatient appropriate because:IV treatments appropriate due to intensity of illness or inability to take PO   Dispo: The patient is from: Home              Anticipated d/c is to: Home              Anticipated d/c date is: 2 days              Patient currently is not medically stable to d/c.         Ezekiel Slocumb, DO [facility]  02/26/2020, 7:23 PM    If 7PM-7AM, please contact night-coverage. How to contact the [facility] Attending or Consulting provider [place] or covering  provider during after hours [place], for this patient?    1. Check the care team in [facility] and look for a) attending/consulting [facility] provider listed and b) the [facility] team listed 2. Log into [email or web address] and use [place]'s universal password to access. If you do not have the password, please contact the hospital operator. 3. Locate the [facility] provider you are looking for under [facility] and page to a number that you can be directly reached. 4. If you still have difficulty reaching the provider, please page the [facility] (Director on Call) for the Hospitalists listed on amion for assistance.

## 2020-02-26 NOTE — Progress Notes (Signed)
[place]  [phone] [phone]     ID: Samantha Lozano DOB: 1945-11-25  MR#: [id number]  FYB#:[id number]  Patient Care Team: Orpah Melter, MD as PCP - General (Family Medicine) Hasten Sweitzer, Virgie Dad, MD as Consulting Physician (Oncology) Chauncey Cruel, MD OTHER MD:  CHIEF COMPLAINT: metastatic cancer  CURRENT TREATMENT: workup in process   INTERVAL HISTORY: Samantha Lozano was scheduled to see me today for follow up of her metastatic cancer and to discuss results of her biopsy.  However as of 02/24/2020 when she went out and did some weed eating she started developing pain in the right hip.  This got worse over the next 24 hours and brought her to the emergency room this morning or she is being evaluated and treated for the pain.  Since her last visit with me, she underwent biopsies of the questionable right breast areas on 02/03/2020. Pathology [phone]) showed no malignancy in any of the three samples-- 3 o'clock showed a fibroadenomatoid nodule with calcifications, and 9 o'clock and 2 o'clock both showed pseudoangiomatous stromal hyperplasia.  She also underwent PET scan on 02/05/2020 to further evaluate her metastatic lesions. This showed: unusual pattern of metastatic disease with multiple organ systems involved (pulmonary metastasis, bone metastasis, and muscle metastasis) and relatively moderate metabolic activity, findings suggest a malignancy with relatively low FDG avidity such as thyroid carcinoma, renal cell carcinoma or myeloma; biopsy target sites could include the hypermetabolic soft tissue mass in the left paraspinal musculature or one of the two hypermetabolic lytic lesions in the right iliac bone; right arm radiotracer infiltration.  I discussed the PET scan result with interventional radiology, and Dr. Vernard Gambles recommended biopsy of the left paraspinal metastasis. This was performed on 02/17/2020, and pathology from the procedure  [phone]) showed: spindle cell malignancy consistent with leiomyosarcoma, grade 1.  I met with [name] and her daughter in law [name] today in the emergency room to discuss these results.  I also discussed them with her emergency room physician Dr. Zenia Resides  REVIEW OF SYSTEMS: Samantha Lozano has had very minimal cough, no phlegm.  She has no pleurisy.  She has had no unusual headaches visual changes nausea or vomiting.  She reports no recent falls.  She has had no change in bowel or bladder habits.  There has been no bleeding no rash no fever no drenching sweats no adenopathy.  The main issue is the right sciatica-like pain described above which is being evaluated currently in the ED  HISTORY OF CURRENT ILLNESS: From the original intake note:  Samantha Lozano presented to the ED on 01/10/2020 with right-sided chest pain for one week. She described it as intermittent sharp pain, worse with deep breathing and certain movements, located near the right breast. She underwent chest CT that day showing: innumerable bilateral pulmonary nodules concerning for metastatic disease; multiple lytic lesions involving bilateral ribs, proximal right humerus, and thoracic spine; probable nondisplaced pathologic fracture involving right lateral 5th rib; no acute cardiopulmonary process; no acute abnormality identified within upper abdomen.  She had bilateral screening mammography at [facility] on 05/26/2019 showing the breast density to be category C.  There was a 1.4 cm group of calcifications in the lower medial quadrant of the right breast which appeared changed.  She was brought back on 05/29/2019 for unilateral diagnostic right mammography which showed only benign scattered calcifications and benign vascular calcifications in the right breast.  There was no evidence of malignancy.  The patient's subsequent history is as detailed below.  PAST MEDICAL HISTORY: Past Medical History:  Diagnosis Date  . Diabetes mellitus,  type 2 ([place])   . HLD (hyperlipidemia)   . HTN (hypertension)   . Leg edema   . Other specified disorders of thyroid     PAST SURGICAL HISTORY: Past Surgical History:  Procedure Laterality Date  . ABDOMINAL HYSTERECTOMY    . THYROID SURGERY      FAMILY HISTORY: Family History  Problem Relation Age of Onset  . Diabetes Mother   . Hypertension Mother   . Cancer Mother   . Obesity Mother   . Hypertension Father   . Cancer Father   . Obesity Father   The patient's father died at age 58 with cancer but the patient does not know what type.  He had many siblings and many of them had cancer but she does not know what types they may have been.  The patient's mother died at age 64 from what sounds like an anaplastic lymphoma.  The patient herself had 7 siblings.  One half sister died from ovarian cancer in her 28s.  1 brother died from pancreatic cancer age 69.   GYNECOLOGIC HISTORY:  No LMP recorded. Patient has had a hysterectomy. Menarche: 74 years old Age at first live birth: 74 years old GX P 1 LMP 61s? HRT no  Hysterectomy? Yes, 09/2007, benign pathology BSO? 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  She is widowed lives by herself, with no pets.  Son [name] runs a water purification program.  His wife, [name], works in the [place] at [employer] long.  The patient is not a church attender.    ADVANCED DIRECTIVES: Son [name] and daughter-in-law [name] are jointly healthcare powers of attorney   HEALTH MAINTENANCE: Social History   Tobacco Use  . Smoking status: Never Smoker  . Smokeless tobacco: Never Used  Substance Use Topics  . Alcohol use: Not Currently  . Drug use: Not Currently     Colonoscopy: 03/2019  PAP: 06/2013, negative  Bone density:    Allergies  Allergen Reactions  . Lisinopril Cough    No current facility-administered medications for this visit.    Current Outpatient Medications  Medication Sig Dispense Refill  . aspirin EC 81 MG tablet Take 81 mg by mouth daily.    . carvedilol (COREG) 25 MG tablet Take 25 mg by mouth 2 (two) times daily with a meal.    . glucose blood test strip Check BS twice daily 100 each 0  . hydrochlorothiazide (HYDRODIURIL) 25 MG tablet Take 25 mg by mouth daily.    [name] HYDROcodone-acetaminophen (NORCO/VICODIN) 5-325 MG tablet Take 1 tablet by mouth every 6 (six) hours as needed for moderate pain. 20 tablet 0  . levothyroxine (SYNTHROID, LEVOTHROID) 125 MCG tablet Take 125 mcg by mouth daily before breakfast.    . losartan (COZAAR) 50 MG tablet Take 1 tablet (50 mg total) by mouth daily. (Patient not taking: Reported on 02/26/2020) 90 tablet 0  . lovastatin (MEVACOR) 40 MG tablet Take 40 mg by mouth at bedtime.    . metFORMIN (GLUCOPHAGE) 850 MG tablet Take 1 tablet (850 mg total) by mouth daily. with food 30 tablet 0  . Omega-3 Fatty Acids (FISH OIL) 435 MG CAPS Take 435 mg by mouth daily at 2 PM.     . Vitamin D, Ergocalciferol, (DRISDOL) 1.25 MG (50000 UNIT) CAPS capsule Take 1 capsule (50,000 Units  total) by mouth every 14 (fourteen) days. (Patient not taking: Reported on 02/26/2020) 6 capsule 0   Facility-Administered Medications Ordered in Other Visits  Medication Dose Route Frequency Provider Last Rate Last Admin  . 0.9 %  sodium chloride infusion   Intravenous Continuous Lacretia Leigh, MD 10 mL/hr at 02/26/20 0933 10 mL/hr at 02/26/20 0933  . carvedilol (COREG) tablet 25 mg  25 mg Oral BID WC Lacretia Leigh, MD   25 mg at 02/26/20 0932  . hydrochlorothiazide (HYDRODIURIL) tablet 25 mg  25 mg Oral Daily Lacretia Leigh, MD   25 mg at 02/26/20 0932  . losartan (COZAAR) tablet 50 mg  50 mg Oral Daily Lacretia Leigh, MD   50 mg at 02/26/20 7793    OBJECTIVE: White woman examined in bed  There were no vitals filed for this visit.   There is no height or weight on file to calculate BMI.   Wt Readings from  Last 3 Encounters:  02/26/20 220 lb (99.8 kg)  02/17/20 221 lb (100.2 kg)  01/22/20 221 lb 4.8 oz (100.4 kg)  For vitals this visit please see the emergency room flowsheet    ECOG FS:2 - Symptomatic, <50% confined to bed  Sclerae unicteric, EOMs intact Wearing a mask No cervical or supraclavicular adenopathy Lungs no rales or rhonchi, auscultated anterolaterally Heart regular rate and rhythm Abd soft, obese, nontender Neuro: nonfocal, well oriented, appropriate affect Breasts: Deferred   LAB RESULTS:  CMP     Component Value Date/Time   NA 139 02/26/2020 0938   NA 141 11/27/2018 1027   K 3.5 02/26/2020 0938   CL 102 02/26/2020 0938   CO2 24 02/26/2020 0938   GLUCOSE 125 (H) 02/26/2020 0938   BUN 14 02/26/2020 0938   BUN 19 11/27/2018 1027   CREATININE 0.65 02/26/2020 0938   CALCIUM 9.7 02/26/2020 0938   PROT 8.8 (H) 02/26/2020 0938   PROT 7.5 11/27/2018 1027   ALBUMIN 4.9 02/26/2020 0938   ALBUMIN 4.6 11/27/2018 1027   AST 22 02/26/2020 0938   ALT 20 02/26/2020 0938   ALKPHOS 88 02/26/2020 0938   BILITOT 1.0 02/26/2020 0938   BILITOT 0.6 11/27/2018 1027   GFRNONAA >60 02/26/2020 0938   GFRAA >60 02/26/2020 0938    No results found for: TOTALPROTELP, ALBUMINELP, A1GS, A2GS, BETS, BETA2SER, GAMS, MSPIKE, SPEI  Lab Results  Component Value Date   WBC 8.1 02/26/2020   NEUTROABS 5.1 02/26/2020   HGB 13.6 02/26/2020   HCT 41.8 02/26/2020   MCV 85.5 02/26/2020   PLT 138 (L) 02/26/2020    No results found for: LABCA2  No components found for: JQZESP233  No results for input(s): INR in the last 168 hours.  No results found for: LABCA2  Lab Results  Component Value Date   AQT622 14 01/13/2020    No results found for: QJF354  No results found for: TGY563  Lab Results  Component Value Date   CA2729 27.5 01/13/2020    No components found for: HGQUANT  Lab Results  Component Value Date   CEA1 <1.00 01/13/2020   /  CEA ([place])  Date  Value Ref Range Status  01/13/2020 <1.00 0.00 - 5.00 ng/mL Final    Comment:    (NOTE) This test was performed using Architect's Chemiluminescent Microparticle Immunoassay. Values obtained from different assay methods cannot be used interchangeably. Please note that 5-10% of patients who smoke may see CEA levels up to 6.9 ng/mL. Performed at [facility], [place] [address] [place], [place] [address]      No results found for: AFPTUMOR  No results found for: CHROMOGRNA  No results found for: KPAFRELGTCHN, LAMBDASER, KAPLAMBRATIO (kappa/lambda light chains)  No results found for: HGBA, HGBA2QUANT, HGBFQUANT, HGBSQUAN (Hemoglobinopathy evaluation)   Lab Results  Component Value Date   LDH 223 (H) 01/13/2020    Lab Results  Component Value Date   IRON 48 01/13/2020   TIBC 321 01/13/2020   IRONPCTSAT 15 (L) 01/13/2020   (Iron and TIBC)  Lab Results  Component Value Date   FERRITIN 63 01/13/2020    Urinalysis No results found for: COLORURINE, APPEARANCEUR, LABSPEC, PHURINE, GLUCOSEU, HGBUR, BILIRUBINUR, KETONESUR, PROTEINUR, UROBILINOGEN, NITRITE, LEUKOCYTESUR   STUDIES: DG Thoracic Spine 4V  Result Date: 02/26/2020 CLINICAL DATA:  Known metastatic disease. Unknown primary. Sudden onset of mid to low back pain extending into the right lower extremity. EXAM: THORACIC SPINE - 4+ VIEW COMPARISON:  CT of the chest 01/10/2020 FINDINGS: 12 rib-bearing thoracic type vertebral bodies are present. Progressive diffuse pulmonary metastases are noted. Vertebral body heights maintained. No acute or healing fractures are present. Degenerative endplate changes are noted throughout the thoracic spine. Sternum and visualized ribs are within normal limits. Degenerative changes are present the lower cervical spine as well. IMPRESSION: 1. Progressive diffuse pulmonary metastases. 2. No acute or healing fractures. 3. Degenerative changes throughout the thoracic and  cervical spine. Electronically Signed   By: San Morelle M.D.   On: 02/26/2020 09:28   DG Lumbar Spine Complete  Result Date: 02/26/2020 CLINICAL DATA:  Severe right sided mid back pain and leg pain beginning at 1 a.m. Known metastatic disease the bone. EXAM: LUMBAR SPINE - COMPLETE 4+ VIEW COMPARISON:  PET scan 02/05/2020. FINDINGS: Five non rib-bearing lumbar type vertebral bodies are present. Vertebral body heights are maintained. No acute or healing fractures are present. No significant listhesis is present. Advanced degenerative changes are noted in the lower lumbar facets. Metastatic disease is less apparent by plain film radiographs. Soft tissues are unremarkable. Atherosclerotic calcifications are present. IMPRESSION: 1. Advanced degenerative changes in the lower lumbar facets without acute or healing fractures. 2. Metastatic disease is less apparent by plain film radiographs. Electronically Signed   By: San Morelle M.D.   On: 02/26/2020 09:25   DG Tibia/Fibula Right  Result Date: 02/26/2020 CLINICAL DATA:  Back pain radiating into the right lower leg. No known injury. EXAM: RIGHT TIBIA AND FIBULA - 2 VIEW COMPARISON:  None. FINDINGS: There is no acute bony or joint abnormality. Osteoarthritis about the knee is most severe in the patellofemoral compartment. Degenerative change at the tibiotalar joint and articulations of the midfoot is also identified. Calcaneal spurring is present. Soft tissues are unremarkable. IMPRESSION: No acute abnormality. Multifocal osteoarthritis. Electronically Signed   By: Inge Rise M.D.   On: 02/26/2020 09:24   NM PET Image Initial (PI) Skull Base To Thigh  Result Date: 02/05/2020 CLINICAL DATA:  Initial treatment strategy for carcinoma unknown primary. COVID vaccine LEFT arm EXAM: NUCLEAR MEDICINE PET SKULL BASE TO THIGH TECHNIQUE: 10.2 mCi F-18 FDG was injected intravenously. Full-ring PET imaging was performed from the skull base to thigh  after the radiotracer. CT data was obtained and used for attenuation correction and anatomic localization. Portion the dose infiltrated in the RIGHT antecubital fossa. While there is intense activity in the arm, there is adequate radiotracer activity throughout the body to perform a diagnostic scan. Fasting blood glucose: 148 mg/dl COMPARISON:  Chest CT  220 FINDINGS: Mediastinal blood pool activity: SUV max 2.25 Liver activity: SUV max 3.6 NECK: No hypermetabolic lymph nodes in the neck. Incidental CT findings: none CHEST: Multiple round pulmonary nodules of varying size. These lesions of mixed metabolic activity. Some lesion without activity. Several lesions do have metabolic activity albeit moderate for size. For example, 1.8 cm lesion in the LEFT upper lobe (image 24) with SUV max equal 3.7. 1.7 cm nodule RIGHT lower lobe SUV max equal 2.5. These the several small nodules with activity. For example LEFT upper lobe nodule measuring 0.9 cm with SUV max 2.0 Within the paraspinal musculature of the lower LEFT back (adjacent to the LEFT twelfth rib), there is soft tissue mass measuring 4.0 cm (image 120/4) with moderate metabolic activity (SUV max equal 3.9. Incidental CT findings: none ABDOMEN/PELVIS: No abnormal activity in liver. Adrenal glands are normal. No hypermetabolic activity associated with colon reproductive organs. Pancreas normal. Spleen normal. Incidental CT findings: Post hysterectomy.  Adnexa unremarkable. SKELETON: There is expansile lytic lesion within the medial RIGHT iliac bone measuring 3.5 cm (image 146/4) with SUV max equal 4.0. The small lytic lesion in the RIGHT iliac bone adjacent to the acetabulum. The soft tissue component measuring 1.5 cm SUV max equal 4. Mild radiotracer activity associated with a RIGHT lateral rib lesion with SUV max equal 3.0 on image 58. Incidental CT findings: Infiltration of radiotracer in the RIGHT arm IMPRESSION: 1. Unusual pattern of metastatic disease with  multiple organ systems involved (pulmonary metastasis, bone metastasis, and muscle metastasis) and relatively moderate metabolic activity. Findings suggest a malignancy with relatively low FDG avidity such as thyroid carcinoma, renal cell carcinoma or myeloma. 2. Biopsy target sites could include the hypermetabolic soft tissue mass in the LEFT paraspinal musculature or one of the two hypermetabolic lytic lesions in the RIGHT iliac bone. 3. RIGHT arm radiotracer infiltration. Electronically Signed   By: Suzy Bouchard M.D.   On: 02/05/2020 09:32   CT Biopsy  Result Date: 02/17/2020 INDICATION: 74 year old female with multifocal metastatic disease of uncertain primary etiology. She has a muscular metastasis in the left paraspinal muscles and presents for CT-guided biopsy of the same. EXAM: CT-guided biopsy MEDICATIONS: None. ANESTHESIA/SEDATION: Moderate (conscious) sedation was employed during this procedure. A total of Versed 2 mg and Fentanyl 100 mcg was administered intravenously. Moderate Sedation Time: 10 minutes. The patient's level of consciousness and vital signs were monitored continuously by radiology nursing throughout the procedure under my direct supervision. FLUOROSCOPY TIME:  None COMPLICATIONS: None immediate. PROCEDURE: Informed written consent was obtained from the patient after a thorough discussion of the procedural risks, benefits and alternatives. All questions were addressed. A timeout was performed prior to the initiation of the procedure. A planning axial CT scan was performed. The soft tissue mass in the left paraspinal musculature was successfully identified. The overlying skin was sterilely prepped and draped in the standard fashion using chlorhexidine skin prep. Local anesthesia was attained by infiltration with 1% lidocaine. A small dermatotomy was made. Under intermittent CT guidance, a 17 gauge introducer needle was advanced and positioned at the margin of the mass. Multiple 18  gauge core biopsies were then coaxially obtained using the bio Pince automated biopsy device. Biopsy specimens were placed in formalin and delivered to pathology for further analysis. No immediate complication. The patient tolerated the procedure well. IMPRESSION: Successful CT-guided core biopsy of left paraspinal soft tissue mass. Electronically Signed   By: Jacqulynn Cadet M.D.   On: 02/17/2020 14:04  ELIGIBLE FOR AVAILABLE RESEARCH PROTOCOL: no  ASSESSMENT: 74 y.o. [facility] woman presenting 01/10/2020 with pleuritic chest pain, chest CT scan same day showing innumerable bilateral pulmonary nodules, largest in the superior left lower lobe measuring 2.5 cm largest on the right side in the lower lobe at 1.9 cm.  Scan also showed lytic lesions at T12, multiple ribs, and right humeral head  (a) CEA, CA 19-9, CA 27-29 all within normal limits 01/13/2020  (b) bone marrow biopsy 01/15/2020 nondiagnostic  (c) right breast mammography and ultrasonography led to biopsy [phone], all benign  (d) head CT with and without contrast 01/21/2020 no evidence of brain metastases  (e) PET scan 02/05/2020 shows multiple lung lesions, a soft tissue mass in the lower left back adjacent to the 12th rib, no abnormal liver activity, no hypermetabolic activity in the colon, pancreas, spleen, or adrenals, expansile lytic lesion in the medial right iliac bone measuring 3.5 cm with SUV of 4.0 and a smaller lytic lesion in the right iliac bone adjacent to the acetabulum measuring 1.5 cm with an SUV max of 4.  (f) CT biopsy obtained 02/17/2020 from the paraspinal mass shows ([id number] [phone]) spindle cell malignancy consistent with leiomyosarcoma, low-grade (0 necrosis, 1 differentiation, 2 mitotic rate = 3)   PLAN: I discussed the overall situation with Heavyn and her daughter-in-law [name].  They understand that we have a cancer that appears to have arisen from smooth muscle somewhere, but now involves in addition to the  paraspinal mass multiple lung and bone areas.  Specifically there are 2 lesions in the right hip area which may be the cause of her current pain.  In terms of pain control if this is due to the cancer she may benefit from palliative radiation.  If the patient is admitted we will obtain a radiation therapy consult.  Likely however she would not be able to start treatment until next week.  In terms of systemic therapy they understand this is not a curable malignancy in my understanding.  However I do not treat sarcomas frequently.  I generally seek a second opinion from [facility] on sarcoma cases.  They are agreeable to this plan.  Accordingly I will try to arrange for a second opinion for [name] at [facility] within the next 2 weeks and to see me in about 3 weeks.  I hope by that time we will have a definitive treatment plan  I greatly appreciate your help to this patient and her family.  We will follow closely with you.  Virgie Dad. Hajer Dwyer, MD 02/26/2020 1:24 PM Medical Oncology and Hematology [facility] [place], [place] [address] Tel. [phone]    Fax. [phone]   This document serves as a record of services personally performed by Lurline Del, MD. It was created on his behalf by Wilburn Mylar, a trained medical scribe. The creation of this record is based on the scribe's personal observations and the provider's statements to them.   I, Lurline Del MD, have reviewed the above documentation for accuracy and completeness, and I agree with the above.   *Total Encounter Time as defined by the Centers for Medicare and Medicaid Services includes, in addition to the face-to-face time of a patient visit (documented in the note above) non-face-to-face time: obtaining and reviewing outside history, ordering and reviewing medications, tests or procedures, care coordination (communications with other health care professionals or caregivers) and documentation in the medical  record.

## 2020-02-26 NOTE — ED Triage Notes (Signed)
74 yo female from home c/o severe right sided mid back pain radiating to right leg since 1am. Denies fall or injury. EMS noted a "notch" to RLE which is new. Pt being evaluated for CA...unknown type at this time. Pt took [place] at [facility].   Allergies: Lisinopril  EMS vitals: 200/100 64-110 97RA  Hx: HTN, NIDDM

## 2020-02-27 DIAGNOSIS — T402X5A Adverse effect of other opioids, initial encounter: Secondary | ICD-10-CM

## 2020-02-27 DIAGNOSIS — Z7189 Other specified counseling: Secondary | ICD-10-CM

## 2020-02-27 DIAGNOSIS — K5903 Drug induced constipation: Secondary | ICD-10-CM

## 2020-02-27 DIAGNOSIS — G893 Neoplasm related pain (acute) (chronic): Principal | ICD-10-CM

## 2020-02-27 LAB — HEMOGLOBIN A1C
Hgb A1c MFr Bld: 6.7 % — ABNORMAL HIGH (ref 4.8–5.6)
Mean Plasma Glucose: 145.59 mg/dL

## 2020-02-27 LAB — CBC
HCT: 34.3 % — ABNORMAL LOW (ref 36.0–46.0)
Hemoglobin: 11.1 g/dL — ABNORMAL LOW (ref 12.0–15.0)
MCH: 27.8 pg (ref 26.0–34.0)
MCHC: 32.4 g/dL (ref 30.0–36.0)
MCV: 86 fL (ref 80.0–100.0)
Platelets: 161 10*3/uL (ref 150–400)
RBC: 3.99 MIL/uL (ref 3.87–5.11)
RDW: 12.6 % (ref 11.5–15.5)
WBC: 6.1 10*3/uL (ref 4.0–10.5)
nRBC: 0 % (ref 0.0–0.2)

## 2020-02-27 LAB — GLUCOSE, CAPILLARY
Glucose-Capillary: 110 mg/dL — ABNORMAL HIGH (ref 70–99)
Glucose-Capillary: 126 mg/dL — ABNORMAL HIGH (ref 70–99)
Glucose-Capillary: 109 mg/dL — ABNORMAL HIGH (ref 70–99)
Glucose-Capillary: 142 mg/dL — ABNORMAL HIGH (ref 70–99)

## 2020-02-27 MED ORDER — HYDROMORPHONE HCL 1 MG/ML IJ SOLN
1.0000 mg | INTRAMUSCULAR | Status: AC
  Administered 2020-02-27: 1 mg via INTRAVENOUS
  Filled 2020-02-27: qty 1

## 2020-02-27 MED ORDER — MORPHINE SULFATE ER 30 MG PO TBCR
30.0000 mg | EXTENDED_RELEASE_TABLET | Freq: Two times a day (BID) | ORAL | Status: DC
  Administered 2020-02-27 – 2020-02-28 (×3): 30 mg via ORAL
  Filled 2020-02-27 (×3): qty 1

## 2020-02-27 MED ORDER — OXYCODONE HCL 5 MG PO TABS
15.0000 mg | ORAL_TABLET | ORAL | Status: DC | PRN
  Administered 2020-02-27 – 2020-02-29 (×13): 15 mg via ORAL
  Filled 2020-02-27 (×13): qty 3

## 2020-02-27 MED ORDER — DEXAMETHASONE 4 MG PO TABS
4.0000 mg | ORAL_TABLET | Freq: Two times a day (BID) | ORAL | Status: DC
  Administered 2020-02-28 – 2020-03-02 (×7): 4 mg via ORAL
  Filled 2020-02-27 (×7): qty 1

## 2020-02-27 MED ORDER — HYDROMORPHONE HCL 1 MG/ML IJ SOLN
1.0000 mg | INTRAMUSCULAR | Status: DC | PRN
  Administered 2020-02-27 – 2020-02-28 (×4): 1 mg via INTRAVENOUS
  Filled 2020-02-27 (×5): qty 1

## 2020-02-27 NOTE — Progress Notes (Signed)
IV infiltrated. Site d/c'd; cath intact.

## 2020-02-27 NOTE — Consult Note (Addendum)
Palliative care consult  Reason for consult: GOC and pain management in metastatic leiomyosarcoma  Palliative care consult received.  Chart reviewed including personal review of pertinent labs and imaging.  I met today with Samantha Lozano and her daughter in law, Samantha Lozano. We discussed clinical course as well as wishes moving forward in regard to advanced directives in light of her metastatic disease.  Concepts specific to code status and care plan this hospitalization discussed.  Values and goals of care important to patient and family were attempted to be elicited.  She reports the most important things to her are her family and spending time with her friends.  She has 1 son and a daughter-in-law with whom she has very close.  She also has a grandchild.  She enjoys taking care of flowers and up until very recently was going on up to 4 mile walks with her friends several times per week.  We talked a great deal about continuing to try and deal with being blindsided by news that she has widely metastatic disease after seeking care for onset of new hip pain.  Discussed her and her family working to develop best care plan for her moving forward and the unknowns associated with this as she just received diagnosis and is awaiting Dr. Jana Hakim to reach out to [facility] to discuss options for systemic therapy moving forward.  We talked about the goal of any interventions being to add as much time in quality to her life as possible and using these goals as a guide when deciding about interventions moving forward.  She states that she has not really discussed with family regarding her wishes and end-of-life nor has she had serious thought about any limitations of her care moving forward.  I encouraged her to discuss this further with her family and we did discuss potential limits being continuation of medical interventions while also avoiding aggressive interventions in the event of cardiac or respiratory arrest.  She and  family will discuss this.  We discussed new onset pain that she describes as sometimes dull and achy and sometimes more sharp located on the right side and it travels down the inside of her leg but also is present on the outer ankle.  It comes and goes and she is not able to specifically identify exacerbating factors.  It does get somewhat better after receiving pain medication.  We talked about usage of pain medications moving forward in light of her metastatic disease.  Discussed risk versus benefit of opioids in her situation and utilization of opioids as a way to continue to maintain and improve her functional status.  We also discussed constipation and working to ensure bowel regimen is effective as she is taking opioids.  I reviewed the [facility] which revealed that she had 5 doses of 2 mg of IV Dilaudid (oral morphine equivalent of 200 mg) as well as oxycodone 10 mg p.o. x3 doses (oral morphine equivalent of 45 mg) for a total 24-hour opioid equivalent of 245 mg of oral morphine.  Questions and concerns addressed.   PMT will continue to support holistically.  - Patients son and daughter in law are her surrogate decision makers in the event she cannot make her own decisions. - She would like to get further information from [place] regarding potential treatment options prior to making further decisions regarding goals of care. - Agree with addition of long acting agent (MS Contin 30 mg twice daily).  Will plan to uptitrate as needed based upon  -  Agree with scheduled tylenol and robaxin. - Plan to increase oxycodone breakthrough medication to Oxycodone 1m every 3 hours as needed for breakthrough pain. - Plan for dilaudid 160mIV to be used as second line pain medication to be given 45 minutes after oral rescue medication if oral medication is insufficient to relieve pain. - Plan for addition of decadron 55m45mwice daily.  - Plan to increase bowel regimen.  States it was Thursday since she had last  bowel movement.  Will increase senna to 2 tabs twice daily as well as MiraLAX daily until she has a bowel movement. - She may benefit from radiation therapy to bone mets.  Agree with plan for rad onc eval.  Total time: 80 minutes Greater than 50%  of this time was spent counseling and coordinating care related to the above assessment and plan.  GenMicheline RoughD ConLaurel Hillam [phone]

## 2020-02-27 NOTE — Progress Notes (Addendum)
PROGRESS NOTE    Samantha Lozano   QIO:[id number]  DOB: Oct 16, 1945  PCP: Orpah Melter, MD    DOA: 02/26/2020 LOS: 1   Brief Narrative   Samantha Lozano is a 74 y.o. female with medical history significant of widespread metastatic disease with very recent tissue diagnosis of leiomyosarcoma grade 1, hypertension, hyperlipidemia, type 2 diabetes, obesity, osteoarthritis who presented to the ED on 02/26/20 for progressive right hip pain in addition to back, right knee, and right ankle pain.  MRI of the hip showed progressive widespread metastatic disease throughout the bony pelvis and proximal femur, with large lytic lesion on the right iliac crest (see report for other findings).  Admitted for management of intractable pain.  Oncology is following.   Assessment & Plan   Principal Problem:   Intractable pain Active Problems:   Bony metastasis (HCC)   Leiomyosarcoma (HCC)   Type 2 diabetes mellitus ([place])   Essential hypertension   Hypothyroidism   Intractable pain due to bony metastatic disease - POA.  Most pain currently in right hip, ankle, knee, back.   --Scheduled Tylenol and Robaxin  (pt got most relief today in ED after Robaxin) --PRN oxycodone 5 mg for moderate, 10mg  for severe pain --IV Dilaudid for breakthrough pain --Will start transition to long acting since patient tolerating pain meds well.  MS Contin 30 mg q12h. --Continue to titrate pain medications  --Bowel regimen --stop IV fluids, eating and drinking well --PT evaluation pending --Palliative care consulted  Leiomyosarcoma - followed by oncologist Dr. Jana Hakim.  Considering palliative radiation for bony mets and pain relief. --Oncology is following  Type 2 diabetes mellitus - on Metformin, held.  Sliding scale Novolog.    Essential hypertension - Uncontrolled on admission likely due to pain.  Continue home Coreg and HCTZ.  No longer taking losartan.  PRN oral hydralazine if  uncontrolled.  Hypothyroidism - continue Synthroid  Patient BMI: Body mass index is 41.25 kg/m.   DVT prophylaxis: enoxaparin (LOVENOX) injection 40 mg Start: 02/26/20 2200   Diet:  Diet Orders (From admission, onward)    Start     Ordered   02/26/20 1825  Diet Carb Modified Fluid consistency: Thin; Room service appropriate? Yes  Diet effective now       Question Answer Comment  Diet-HS Snack? Nothing   Calorie Level Medium 1600-2000   Fluid consistency: Thin   Room service appropriate? Yes      02/26/20 1824            Code Status: Full Code    Subjective 02/27/20    Patient seen with daughter at bedside.  She reports continued pain but is better controlled after medications.  Says IV dilaudid takes almost all pain away quickly but does not last very long.  Denies bothersome side effects.     Disposition Plan & Communication   Status is: Inpatient  Remains inpatient appropriate because:IV treatments appropriate due to intensity of illness or inability to take PO   Dispo: The patient is from: Home              Anticipated d/c is to: Home              Anticipated d/c date is: 2 days              Patient currently is not medically stable to d/c.   Family Communication: daughter present at bedside during encounter, updated, questions answered and she is agreeable with the plan.  Consults, Procedures, Significant Events   Consultants:   Oncology  Palliative Care  Procedures:   None  Antimicrobials:   None    Objective   Vitals:   02/26/20 1814 02/26/20 1830 02/26/20 2019 02/26/20 2037  BP: 130/81 (!) 148/76  136/65  Pulse: 77 78  (!) 57  Resp: 18 14  15   Temp:    98 F (36.7 C)  TempSrc:    Oral  SpO2: 92% 92%  91%  Weight:   102.3 kg   Height:       No intake or output data in the 24 hours ending 02/27/20 0801 Filed Weights   02/26/20 0732 02/26/20 2019  Weight: 99.8 kg 102.3 kg    Physical Exam:  General exam: awake, alert, no  acute distress HEENT: moist mucus membranes, hearing grossly normal  Respiratory system: CTAB, no wheezes, rales or rhonchi, normal respiratory effort. Cardiovascular system: normal S1/S2, RRR, no pedal edema.   Gastrointestinal system: soft, NT, ND, +bowel sounds. Central nervous system: A&O x4. no gross focal neurologic deficits, normal speech Extremities: moves all, right ankle swelling and pain on palpation is unchanged.  No cyanosis. Psychiatry: normal mood, congruent affect, judgement and insight appear normal  Labs   Data Reviewed: I have personally reviewed following labs and imaging studies  CBC: Recent Labs  Lab 02/26/20 0938 02/27/20 0643  WBC 8.1 6.1  NEUTROABS 5.1  --   HGB 13.6 11.1*  HCT 41.8 34.3*  MCV 85.5 86.0  PLT 138* 944   Basic Metabolic Panel: Recent Labs  Lab 02/26/20 0938  NA 139  K 3.5  CL 102  CO2 24  GLUCOSE 125*  BUN 14  CREATININE 0.65  CALCIUM 9.7   GFR: Estimated Creatinine Clearance: 71.2 mL/min (by C-G formula based on SCr of 0.65 mg/dL). Liver Function Tests: Recent Labs  Lab 02/26/20 0938  AST 22  ALT 20  ALKPHOS 88  BILITOT 1.0  PROT 8.8*  ALBUMIN 4.9   No results for input(s): LIPASE, AMYLASE in the last 168 hours. No results for input(s): AMMONIA in the last 168 hours. Coagulation Profile: No results for input(s): INR, PROTIME in the last 168 hours. Cardiac Enzymes: Recent Labs  Lab 02/26/20 0938  CKTOTAL 133   BNP (last 3 results) No results for input(s): PROBNP in the last 8760 hours. HbA1C: No results for input(s): HGBA1C in the last 72 hours. CBG: Recent Labs  Lab 02/26/20 2301 02/27/20 0756  GLUCAP 123* 110*   Lipid Profile: No results for input(s): CHOL, HDL, LDLCALC, TRIG, CHOLHDL, LDLDIRECT in the last 72 hours. Thyroid Function Tests: No results for input(s): TSH, T4TOTAL, FREET4, T3FREE, THYROIDAB in the last 72 hours. Anemia Panel: No results for input(s): VITAMINB12, FOLATE, FERRITIN, TIBC,  IRON, RETICCTPCT in the last 72 hours. Sepsis Labs: No results for input(s): PROCALCITON, LATICACIDVEN in the last 168 hours.  Recent Results (from the past 240 hour(s))  SARS Coronavirus 2 by RT PCR (hospital order, performed in [facility] hospital lab) Nasopharyngeal Nasopharyngeal Swab     Status: None   Collection Time: 02/26/20  4:10 PM   Specimen: Nasopharyngeal Swab  Result Value Ref Range Status   SARS Coronavirus 2 NEGATIVE NEGATIVE Final    Comment: (NOTE) SARS-CoV-2 target nucleic acids are NOT DETECTED.  The SARS-CoV-2 RNA is generally detectable in upper and lower respiratory specimens during the acute phase of infection. The lowest concentration of SARS-CoV-2 viral copies this assay can detect is 250 copies / mL. A  negative result does not preclude SARS-CoV-2 infection and should not be used as the sole basis for treatment or other patient management decisions.  A negative result may occur with improper specimen collection / handling, submission of specimen other than nasopharyngeal swab, presence of viral mutation(s) within the areas targeted by this assay, and inadequate number of viral copies (<250 copies / mL). A negative result must be combined with clinical observations, patient history, and epidemiological information.  Fact Sheet for Patients:   StrictlyIdeas.no  Fact Sheet for Healthcare Providers: BankingDealers.co.za  This test is not yet approved or  cleared by the [place] FDA and has been authorized for detection and/or diagnosis of SARS-CoV-2 by FDA under an Emergency Use Authorization (EUA).  This EUA will remain in effect (meaning this test can be used) for the duration of the COVID-19 declaration under Section 564(b)(1) of the Act, 21 U.S.C. section 360bbb-3(b)(1), unless the authorization is terminated or revoked sooner.  Performed at [facility], [place] [address], [place], [place] [address]       Imaging Studies   DG Thoracic Spine 4V  Result Date: 02/26/2020 CLINICAL DATA:  Known metastatic disease. Unknown primary. Sudden onset of mid to low back pain extending into the right lower extremity. EXAM: THORACIC SPINE - 4+ VIEW COMPARISON:  CT of the chest 01/10/2020 FINDINGS: 12 rib-bearing thoracic type vertebral bodies are present. Progressive diffuse pulmonary metastases are noted. Vertebral body heights maintained. No acute or healing fractures are present. Degenerative endplate changes are noted throughout the thoracic spine. Sternum and visualized ribs are within normal limits. Degenerative changes are present the lower cervical spine as well. IMPRESSION: 1. Progressive diffuse pulmonary metastases. 2. No acute or healing fractures. 3. Degenerative changes throughout the thoracic and cervical spine. Electronically Signed   By: San Morelle M.D.   On: 02/26/2020 09:28   DG Lumbar Spine Complete  Result Date: 02/26/2020 CLINICAL DATA:  Severe right sided mid back pain and leg pain beginning at 1 a.m. Known metastatic disease the bone. EXAM: LUMBAR SPINE - COMPLETE 4+ VIEW COMPARISON:  PET scan 02/05/2020. FINDINGS: Five non rib-bearing lumbar type vertebral bodies are present. Vertebral body heights are maintained. No acute or healing fractures are present. No significant listhesis is present. Advanced degenerative changes are noted in the lower lumbar facets. Metastatic disease is less apparent by plain film radiographs. Soft tissues are unremarkable. Atherosclerotic calcifications are present. IMPRESSION: 1. Advanced degenerative changes in the lower lumbar facets without acute or healing fractures. 2. Metastatic disease is less apparent by plain film radiographs. Electronically Signed   By: San Morelle M.D.   On: 02/26/2020 09:25   DG Tibia/Fibula Right  Result Date: 02/26/2020 CLINICAL DATA:  Back pain radiating into the right lower  leg. No known injury. EXAM: RIGHT TIBIA AND FIBULA - 2 VIEW COMPARISON:  None. FINDINGS: There is no acute bony or joint abnormality. Osteoarthritis about the knee is most severe in the patellofemoral compartment. Degenerative change at the tibiotalar joint and articulations of the midfoot is also identified. Calcaneal spurring is present. Soft tissues are unremarkable. IMPRESSION: No acute abnormality. Multifocal osteoarthritis. Electronically Signed   By: Inge Rise M.D.   On: 02/26/2020 09:24   MR HIP RIGHT W WO CONTRAST  Result Date: 02/26/2020 CLINICAL DATA:  Severe right mid back pain radiating into the right leg today. No acute injury. Metastatic carcinoma of unknown primary. EXAM: MRI OF THE RIGHT HIP WITHOUT AND WITH CONTRAST TECHNIQUE: Multiplanar, multisequence MR  imaging was performed both before and after administration of intravenous contrast. CONTRAST:  42mL GADAVIST GADOBUTROL 1 MMOL/ML IV SOLN COMPARISON:  PET-CT 02/05/2020. FINDINGS: Despite efforts by the technologist and patient, mild motion artifact is present on today's exam and could not be eliminated. This reduces exam sensitivity and specificity. Bones: There is widespread osseous metastatic disease which appears progressive compared with previous PET-CT. Largest lesions include a 3.3 cm in the subtrochanteric region of the proximal right femur (image 14/6) and a 2.1 cm lesion near the right anterior inferior iliac spine (image 5/6). Additional lesions are present throughout the bony pelvis, including bilateral sacral lesions and prominent involvement of the L5 vertebral body on the right. There is a large lesion involving the right iliac crest with extraosseous soft tissue extension, measuring up to 4.8 cm on image 10/4. There is a small lesion in the proximal left femoral diaphysis. No definite pathologic fracture at this time. Articular cartilage and labrum Articular cartilage: No focal chondral defect or subchondral signal  abnormality identified. Labrum: There is no gross labral tear or paralabral abnormality. Joint or bursal effusion Joint effusion: No significant hip joint effusion. Bursae: No focal periarticular fluid collection. Muscles and tendons Muscles and tendons: Partial tear of the left common hamstring tendon. The gluteus and iliopsoas tendons appear intact. The location of the lesion at the right anterior inferior iliac spine predisposes to avulsive injury of the rectus femoris tendon. Other findings Miscellaneous: 1.7 cm subcutaneous metastasis noted superficial to the right tensor fascia lata muscle on image 5/6. IMPRESSION: 1. Progressive widespread osseous metastatic disease throughout the bony pelvis and proximal right femur. No definite pathologic fracture at this time. 2. Large lesion involving the right iliac CREST with extraosseous soft tissue extension. The location of the lesion at the right anterior inferior iliac spine predisposes to avulsive injury of the rectus femoris tendon. 3. 1.7 cm subcutaneous metastasis superficial to the right tensor fascia lata muscle. 4. Partial tear of the left common hamstring tendon. Electronically Signed   By: Richardean Sale M.D.   On: 02/26/2020 15:16     Medications   Scheduled Meds: . acetaminophen  1,000 mg Oral Q8H  . aspirin EC  81 mg Oral Daily  . carvedilol  25 mg Oral BID WC  . enoxaparin (LOVENOX) injection  40 mg Subcutaneous Q24H  . hydrochlorothiazide  25 mg Oral Daily  . insulin aspart  0-9 Units Subcutaneous TID WC  . levothyroxine  125 mcg Oral Q0600  . losartan  50 mg Oral Daily  . methocarbamol  1,000 mg Oral QID  . omega-3 acid ethyl esters  1 g Oral Daily  . pravastatin  40 mg Oral q1800  . senna  1 tablet Oral BID   Continuous Infusions: . sodium chloride 10 mL/hr (02/26/20 0933)  . sodium chloride 75 mL/hr at 02/26/20 2049       LOS: 1 day    Time spent: 30 minutes    Ezekiel Slocumb, DO [facility]  02/27/2020, 8:01 AM    If 7PM-7AM, please contact night-coverage. How to contact the [facility] Attending or Consulting provider [place] or covering provider during after hours [place], for this patient?    1. Check the care team in [place] and look for a) attending/consulting [facility] provider listed and b) the [facility] team listed 2. Log into [email or web address] and use [place]'s universal password to access. If you do not have the password, please contact the hospital operator. 3. Locate  the [facility] provider you are looking for under [facility] and page to a number that you can be directly reached. 4. If you still have difficulty reaching the provider, please page the [facility] (Director on Call) for the Hospitalists listed on amion for assistance.

## 2020-02-27 NOTE — Evaluation (Addendum)
Physical Therapy Evaluation Patient Details Name: Samantha Lozano MRN: [id number] DOB: November 18, 1945 Today's Date: 02/27/2020   History of Present Illness  74 yo female admitted with intractable pain. Imaging (+) lytic lesions pelvis and R LE. Hx of met ca, Dm, OA, obesity, leimyosarcoma  Clinical Impression  On eval, pt was Min guard assist for mobility. She walked ~100 feet with a RW. Moderate pain (5/10) with activity. Pt reports "I can move...but it's because of all this medicine I'm on". Discussed d/c plan-pt will have some assistance from family. She is agreeable to HHPT f/u. Encouraged pt to continue to mobilize/slowly increase activity as tolerated but "don't over do it".     Follow Up Recommendations Home health PT;Supervision for mobility/OOB    Equipment Recommendations  Rolling walker with 5" wheels    Recommendations for Other Services       Precautions / Restrictions Precautions Precautions: Fall Restrictions Weight Bearing Restrictions: No      Mobility  Bed Mobility               General bed mobility comments: sitting EOB  Transfers Overall transfer level: Needs assistance Equipment used: Rolling walker (2 wheeled) Transfers: Sit to/from Stand Sit to Stand: Supervision         General transfer comment: for safety.  Ambulation/Gait Ambulation/Gait assistance: Min guard Gait Distance (Feet): 100 Feet Assistive device: Rolling walker (2 wheeled) Gait Pattern/deviations: Step-through pattern;Decreased stride length;Antalgic     General Gait Details: Min guard for safety. Fair gait speed. No LOB with RW use.  Stairs            Wheelchair Mobility    Modified Rankin (Stroke Patients Only)       Balance Overall balance assessment: Needs assistance         Standing balance support: Bilateral upper extremity supported Standing balance-Leahy Scale: Poor                               Pertinent Vitals/Pain Pain  Assessment: 0-10 Pain Score: 5  Pain Location: 2/10 at rest; 5/10 with activity R hip/ankle Pain Descriptors / Indicators: Aching;Discomfort;Sore Pain Intervention(s): Limited activity within patient's tolerance;Monitored during session    [place] expects to be discharged to:: Private residence Living Arrangements: Alone   Type of Home: House Home Access: [place]: One [place]: Environmental consultant - 2 wheels;Bedside commode;Cane - single point      Prior Function Level of Independence: Independent with assistive device(s)         Comments: uses cane PRN     Hand Dominance        Extremity/Trunk Assessment   Upper Extremity Assessment Upper Extremity Assessment: Overall WFL for tasks assessed    Lower Extremity Assessment Lower Extremity Assessment: Generalized weakness    Cervical / Trunk Assessment Cervical / Trunk Assessment: Normal  Communication   Communication: No difficulties  Cognition Arousal/Alertness: Awake/alert Behavior During Therapy: WFL for tasks assessed/performed Overall Cognitive Status: Within Functional Limits for tasks assessed                                        General Comments      Exercises     Assessment/Plan    PT Assessment Patient needs continued PT services  PT Problem List Decreased strength;Decreased  mobility;Decreased activity tolerance;Decreased balance;Decreased knowledge of use of DME;Pain       PT Treatment Interventions DME instruction;Gait training;Therapeutic activities;Therapeutic exercise;Patient/family education;Balance training;Functional mobility training    PT Goals (Current goals can be found in the Care Plan section)  Acute Rehab PT Goals Patient Stated Goal: less pain. regain functional mobility PT Goal Formulation: With patient/family Time For Goal Achievement: 03/12/20 Potential to Achieve Goals: Good    Frequency Min 3X/week    Barriers to discharge        Co-evaluation               AM-PAC PT "6 Clicks" Mobility  Outcome Measure Help needed turning from your back to your side while in a flat bed without using bedrails?: A Little Help needed moving from lying on your back to sitting on the side of a flat bed without using bedrails?: A Little Help needed moving to and from a bed to a chair (including a wheelchair)?: A Little Help needed standing up from a chair using your arms (e.g., wheelchair or bedside chair)?: A Little Help needed to walk in hospital room?: A Little Help needed climbing 3-5 steps with a railing? : A Little 6 Click Score: 18    End of Session Equipment Utilized During Treatment: Gait belt Activity Tolerance: Patient tolerated treatment well Patient left: in bed;with call bell/phone within reach;with family/visitor present   PT Visit Diagnosis: Muscle weakness (generalized) (M62.81);Difficulty in walking, not elsewhere classified (R26.2);Pain Pain - Right/Left: Right Pain - part of body: Leg;Hip;Ankle and joints of foot    Time: 1435-1446 PT Time Calculation (min) (ACUTE ONLY): 11 min   Charges:   PT Evaluation $PT Eval Low Complexity: [place], PT Acute Rehabilitation  Office: [phone] Pager: [phone]

## 2020-02-28 DIAGNOSIS — Z515 Encounter for palliative care: Secondary | ICD-10-CM

## 2020-02-28 DIAGNOSIS — R52 Pain, unspecified: Secondary | ICD-10-CM

## 2020-02-28 LAB — BASIC METABOLIC PANEL
Anion gap: 8 (ref 5–15)
BUN: 18 mg/dL (ref 8–23)
CO2: 24 mmol/L (ref 22–32)
Calcium: 9 mg/dL (ref 8.9–10.3)
Chloride: 103 mmol/L (ref 98–111)
Creatinine, Ser: 0.74 mg/dL (ref 0.44–1.00)
GFR calc Af Amer: >60 mL/min (ref >60)
GFR calc non Af Amer: >60 mL/min (ref >60)
Glucose, Bld: 107 mg/dL — ABNORMAL HIGH (ref 70–99)
Potassium: 3.2 mmol/L — ABNORMAL LOW (ref 3.5–5.1)
Sodium: 135 mmol/L (ref 135–145)

## 2020-02-28 LAB — GLUCOSE, CAPILLARY
Glucose-Capillary: 110 mg/dL — ABNORMAL HIGH (ref 70–99)
Glucose-Capillary: 128 mg/dL — ABNORMAL HIGH (ref 70–99)
Glucose-Capillary: 195 mg/dL — ABNORMAL HIGH (ref 70–99)
Glucose-Capillary: 259 mg/dL — ABNORMAL HIGH (ref 70–99)

## 2020-02-28 LAB — MAGNESIUM: Magnesium: 1.8 mg/dL (ref 1.7–2.4)

## 2020-02-28 MED ORDER — POLYETHYLENE GLYCOL 3350 17 G PO PACK
17.0000 g | PACK | Freq: Two times a day (BID) | ORAL | Status: DC
  Administered 2020-02-28 – 2020-03-02 (×6): 17 g via ORAL
  Filled 2020-02-28 (×7): qty 1

## 2020-02-28 MED ORDER — MORPHINE SULFATE ER 30 MG PO TBCR
60.0000 mg | EXTENDED_RELEASE_TABLET | Freq: Two times a day (BID) | ORAL | Status: DC
  Administered 2020-02-28 – 2020-03-02 (×6): 60 mg via ORAL
  Filled 2020-02-28 (×6): qty 2

## 2020-02-28 MED ORDER — POTASSIUM CHLORIDE CRYS ER 20 MEQ PO TBCR
40.0000 meq | EXTENDED_RELEASE_TABLET | ORAL | Status: AC
  Administered 2020-02-28 (×2): 40 meq via ORAL
  Filled 2020-02-28 (×2): qty 2

## 2020-02-28 MED ORDER — SENNA 8.6 MG PO TABS
2.0000 | ORAL_TABLET | Freq: Two times a day (BID) | ORAL | Status: DC
  Administered 2020-02-28 – 2020-03-02 (×6): 17.2 mg via ORAL
  Filled 2020-02-28 (×8): qty 2

## 2020-02-28 NOTE — Progress Notes (Addendum)
PROGRESS NOTE    Samantha Lozano   ALP:[id number]  DOB: [birth year]  PCP: Orpah Melter, MD    DOA: 02/26/2020 LOS: 2   Brief Narrative   Samantha Lozano is a 74 y.o. female with medical history significant of widespread metastatic disease with very recent tissue diagnosis of leiomyosarcoma grade 1, hypertension, hyperlipidemia, type 2 diabetes, obesity, osteoarthritis who presented to the ED on 02/26/20 for progressive right hip pain in addition to back, right knee, and right ankle pain.  MRI of the hip showed progressive widespread metastatic disease throughout the bony pelvis and proximal femur, with large lytic lesion on the right iliac crest (see report for other findings).  Admitted for management of intractable pain.  Oncology is following.   Assessment & Plan   Principal Problem:   Intractable pain Active Problems:   Bony metastasis (HCC)   Leiomyosarcoma (HCC)   Type 2 diabetes mellitus ([place])   Essential hypertension   Hypothyroidism   Intractable pain due to bony metastatic disease - POA.  Most pain currently in right hip, ankle, knee, back.   --Scheduled Tylenol and Robaxin  --MS Contin increased to 60 mg q12h based on MME requirements past 2 days --Oxycodone 15 mg q3h PRN --IV Dilaudid 1 mg PRN if oxycodone not effective after 1 hour --Continue to titrate pain medications  --Bowel regimen --PT evaluation pending --Palliative care consulted, appreciated  Leiomyosarcoma - followed by oncologist Dr. Jana Hakim.  Considering palliative radiation for bony mets and pain relief.  Planning for 2nd opinion at [facility]. --Oncology is following   Hypokalemia - K 3.2 this AM. Replaced by PO.  BMP and Mg in AM.  Type 2 diabetes mellitus - on Metformin, held.  Sliding scale Novolog.    Essential hypertension - Uncontrolled on admission likely due to pain.  Continue home Coreg and HCTZ.  No longer taking losartan.  PRN oral hydralazine if uncontrolled.  Hypothyroidism -  continue Synthroid  Morbid Obesity: Body mass index is 41.09 kg/m.  Complicates overall care and prognosis.  DVT prophylaxis: enoxaparin (LOVENOX) injection 40 mg Start: 02/26/20 2200   Diet:  Diet Orders (From admission, onward)    Start     Ordered   02/26/20 1825  Diet Carb Modified Fluid consistency: Thin; Room service appropriate? Yes  Diet effective now       Question Answer Comment  Diet-HS Snack? Nothing   Calorie Level Medium 1600-2000   Fluid consistency: Thin   Room service appropriate? Yes      02/26/20 1824            Code Status: Full Code    Subjective 02/28/20    Patient seen with daughter-in-law at bedside.  Patient says her pain is not as well-controlled today as it was yesterday.  She developed severe right hip pain with weightbearing today, but was able to tolerate ambulation yesterday.  No BM yet with current stool softeners.  Denies other acute complaints.      Disposition Plan & Communication   Status is: Inpatient  Remains inpatient appropriate because:IV treatments appropriate due to intensity of illness or inability to take PO. Continuing to titrate pain meds, with ongoing need for IV at this time.   Dispo: The patient is from: Home              Anticipated d/c is to: Home              Anticipated d/c date is: 2 days  Patient currently is not medically stable to d/c.   Family Communication: daughter present at bedside during encounter, updated, questions answered and she is agreeable with the plan.   Consults, Procedures, Significant Events   Consultants:   Oncology  Palliative Care  Procedures:   None  Antimicrobials:   None    Objective   Vitals:   02/27/20 2058 02/28/20 0638 02/28/20 0908 02/28/20 1356  BP: (!) 144/68 119/80 (!) 187/98 (!) 172/87  Pulse: 62 61 (!) 56 68  Resp: 18 18  18   Temp: 98 F (36.7 C) 98 F (36.7 C)  98.2 F (36.8 C)  TempSrc: Oral Oral  Oral  SpO2: 95% 92% 93% 90%  Weight:   101.9 kg    Height:        Intake/Output Summary (Last 24 hours) at 02/28/2020 1705 Last data filed at 02/28/2020 1000 Gross per 24 hour  Intake 1200 ml  Output --  Net 1200 ml   Filed Weights   02/26/20 0732 02/26/20 2019 02/28/20 0638  Weight: 99.8 kg 102.3 kg 101.9 kg    Physical Exam:  General exam: awake, alert, no acute distress HEENT: moist mucus membranes, hearing grossly normal  Respiratory system: CTAB, no wheezes, rales or rhonchi, normal respiratory effort. Cardiovascular system: normal S1/S2, RRR, no pedal edema.   Gastrointestinal system: soft, NT, ND, +bowel sounds. Central nervous system: A&O x4. no gross focal neurologic deficits, normal speech Extremities: moves all, right ankle swelling and pain on palpation is unchanged.  No cyanosis. Psychiatry: normal mood, congruent affect, judgement and insight appear normal  Labs   Data Reviewed: I have personally reviewed following labs and imaging studies  CBC: Recent Labs  Lab 02/26/20 0938 02/27/20 0643  WBC 8.1 6.1  NEUTROABS 5.1  --   HGB 13.6 11.1*  HCT 41.8 34.3*  MCV 85.5 86.0  PLT 138* 269   Basic Metabolic Panel: Recent Labs  Lab 02/26/20 0938 02/28/20 0717  NA 139 135  K 3.5 3.2*  CL 102 103  CO2 24 24  GLUCOSE 125* 107*  BUN 14 18  CREATININE 0.65 0.74  CALCIUM 9.7 9.0  MG  --  1.8   GFR: Estimated Creatinine Clearance: 71 mL/min (by C-G formula based on SCr of 0.74 mg/dL). Liver Function Tests: Recent Labs  Lab 02/26/20 0938  AST 22  ALT 20  ALKPHOS 88  BILITOT 1.0  PROT 8.8*  ALBUMIN 4.9   No results for input(s): LIPASE, AMYLASE in the last 168 hours. No results for input(s): AMMONIA in the last 168 hours. Coagulation Profile: No results for input(s): INR, PROTIME in the last 168 hours. Cardiac Enzymes: Recent Labs  Lab 02/26/20 0938  CKTOTAL 133   BNP (last 3 results) No results for input(s): PROBNP in the last 8760 hours. HbA1C: Recent Labs    02/27/20 0643   HGBA1C 6.7*   CBG: Recent Labs  Lab 02/27/20 1149 02/27/20 1641 02/27/20 2149 02/28/20 0736 02/28/20 1139  GLUCAP 126* 109* 142* 110* 128*   Lipid Profile: No results for input(s): CHOL, HDL, LDLCALC, TRIG, CHOLHDL, LDLDIRECT in the last 72 hours. Thyroid Function Tests: No results for input(s): TSH, T4TOTAL, FREET4, T3FREE, THYROIDAB in the last 72 hours. Anemia Panel: No results for input(s): VITAMINB12, FOLATE, FERRITIN, TIBC, IRON, RETICCTPCT in the last 72 hours. Sepsis Labs: No results for input(s): PROCALCITON, LATICACIDVEN in the last 168 hours.  Recent Results (from the past 240 hour(s))  SARS Coronavirus 2 by RT PCR (hospital order, performed  in [place] lab) Nasopharyngeal Nasopharyngeal Swab     Status: None   Collection Time: 02/26/20  4:10 PM   Specimen: Nasopharyngeal Swab  Result Value Ref Range Status   SARS Coronavirus 2 NEGATIVE NEGATIVE Final    Comment: (NOTE) SARS-CoV-2 target nucleic acids are NOT DETECTED.  The SARS-CoV-2 RNA is generally detectable in upper and lower respiratory specimens during the acute phase of infection. The lowest concentration of SARS-CoV-2 viral copies this assay can detect is 250 copies / mL. A negative result does not preclude SARS-CoV-2 infection and should not be used as the sole basis for treatment or other patient management decisions.  A negative result may occur with improper specimen collection / handling, submission of specimen other than nasopharyngeal swab, presence of viral mutation(s) within the areas targeted by this assay, and inadequate number of viral copies (<250 copies / mL). A negative result must be combined with clinical observations, patient history, and epidemiological information.  Fact Sheet for Patients:   StrictlyIdeas.no  Fact Sheet for Healthcare Providers: BankingDealers.co.za  This test is not yet approved or  cleared by the  [place] FDA and has been authorized for detection and/or diagnosis of SARS-CoV-2 by FDA under an Emergency Use Authorization (EUA).  This EUA will remain in effect (meaning this test can be used) for the duration of the COVID-19 declaration under Section 564(b)(1) of the Act, 21 U.S.C. section 360bbb-3(b)(1), unless the authorization is terminated or revoked sooner.  Performed at [facility], [place] [address], [place], [place] [address]       Imaging Studies   No results found.   Medications   Scheduled Meds: . acetaminophen  1,000 mg Oral Q8H  . aspirin EC  81 mg Oral Daily  . carvedilol  25 mg Oral BID WC  . dexamethasone  4 mg Oral Q12H  . enoxaparin (LOVENOX) injection  40 mg Subcutaneous Q24H  . hydrochlorothiazide  25 mg Oral Daily  . insulin aspart  0-9 Units Subcutaneous TID WC  . levothyroxine  125 mcg Oral Q0600  . losartan  50 mg Oral Daily  . methocarbamol  1,000 mg Oral QID  . morphine  60 mg Oral Q12H  . omega-3 acid ethyl esters  1 g Oral Daily  . polyethylene glycol  17 g Oral BID  . pravastatin  40 mg Oral q1800  . senna  2 tablet Oral BID   Continuous Infusions: . sodium chloride 10 mL/hr (02/26/20 0933)       LOS: 2 days    Time spent: 25 minutes    Ezekiel Slocumb, DO [facility]  02/28/2020, 5:05 PM    If 7PM-7AM, please contact night-coverage. How to contact the [facility] Attending or Consulting provider [place] or covering provider during after hours [place], for this patient?    1. Check the care team in [facility] and look for a) attending/consulting TR[facility] provider listed and b) the [facility] team listed 2. Log into [email or web address] and use [place]'s universal password to access. If you do not have the password, please contact the hospital operator. 3. Locate the [facility] provider you are looking for under [facility] and page to a number that you can be directly reached. 4. If you still have difficulty reaching the  provider, please page the [facility] (Director on Call) for the Hospitalists listed on amion for assistance.

## 2020-02-28 NOTE — Evaluation (Signed)
Occupational Therapy Evaluation Patient Details Name: Samantha Lozano MRN: [id number] DOB: Jul 06, 1946 Today's Date: 02/28/2020    History of Present Illness 74 yo female admitted with intractable pain. Imaging (+) lytic lesions pelvis and R LE. Hx of met ca, Dm, OA, obesity, leimyosarcoma   Clinical Impression   Patient with functional deficits listed below impacting safety and independence with self care. Patient primarily limiting factor is pain with mobility in R LE. Patient premedicated before session reporting 2/10 pain, however with weight bearing patient reports soreness/stiffness and request more pain medication rating 6/10 down entire R leg. Educate patient in pushing through UEs when stepping with L LE to minimize WB through R LE for pain control which patient return demos well. Patient primarily supervision level for toilet transfer and sink side g/h. Provide ice for pt R hip to assist with pain management. Will continue to follow.    Follow Up Recommendations  Home health OT;Supervision - Intermittent    Equipment Recommendations  None recommended by OT;Other (comment) (patient has necessary equipment)       Precautions / Restrictions Precautions Precautions: Fall Restrictions Weight Bearing Restrictions: No      Mobility Bed Mobility Overal bed mobility: Modified Independent             General bed mobility comments: use of bed rail   Transfers Overall transfer level: Needs assistance Equipment used: Rolling walker (2 wheeled) Transfers: Sit to/from Stand Sit to Stand: Supervision         General transfer comment: cues for hand placement    Balance Overall balance assessment: Needs assistance Sitting-balance support: No upper extremity supported;Feet supported Sitting balance-Leahy Scale: Good     Standing balance support: Bilateral upper extremity supported;During functional activity Standing balance-Leahy Scale: Poor Standing balance comment:  reliant B UE support due to R LE pain                           ADL either performed or assessed with clinical judgement   ADL Overall ADL's : Needs assistance/impaired     Grooming: Supervision/safety;Oral care;Wash/dry face;Wash/dry hands;Standing   Upper Body Bathing: Set up;Sitting   Lower Body Bathing: Supervison/ safety;Sitting/lateral leans;Sit to/from stand   Upper Body Dressing : Set up;Sitting   Lower Body Dressing: Supervision/safety;Sitting/lateral leans;Sit to/from stand   Toilet Transfer: Supervision/safety;Cueing for [employer];Ambulation;RW;Grab bars   Toileting- Clothing Manipulation and Hygiene: Supervision/safety;Sit to/from stand       Functional mobility during ADLs: Supervision/safety;Rolling walker General ADL Comments: min cues for sequencing with walker to try and minimize pain/weight bearing through R LE                  Pertinent Vitals/Pain Pain Assessment: 0-10 Pain Score: 6  Pain Location: 2/10 rest; 6/10 activity R hip/ankle Pain Descriptors / Indicators: Aching;Discomfort;Sore Pain Intervention(s): Premedicated before session;Patient requesting pain meds-RN notified;Ice applied     Hand Dominance Right   Extremity/Trunk Assessment Upper Extremity Assessment Upper Extremity Assessment: Overall WFL for tasks assessed   Lower Extremity Assessment Lower Extremity Assessment: Defer to PT evaluation   Cervical / Trunk Assessment Cervical / Trunk Assessment: Normal   Communication Communication Communication: No difficulties   Cognition Arousal/Alertness: Awake/alert Behavior During Therapy: WFL for tasks assessed/performed Overall Cognitive Status: Within Functional Limits for tasks assessed  Home Living Family/patient expects to be discharged to:: Private residence Living Arrangements: Alone   Type of Home: House Home Access: [place]: One level     Bathroom Shower/Tub: Occupational psychologist: Handicapped height     Home Equipment: Environmental consultant - 2 wheels;Bedside commode;Cane - single point;Shower seat          Prior Functioning/Environment Level of Independence: Independent with assistive device(s)        Comments: uses cane PRN        OT Problem List: Pain;Decreased activity tolerance;Impaired balance (sitting and/or standing)      OT Treatment/Interventions: Self-care/ADL training;Therapeutic exercise;Energy conservation;DME and/or AE instruction;Therapeutic activities;Patient/family education;Balance training    OT Goals(Current goals can be found in the care plan section) Acute Rehab OT Goals Patient Stated Goal: less pain. regain functional mobility OT Goal Formulation: With patient Time For Goal Achievement: 03/13/20 Potential to Achieve Goals: Good  OT Frequency: Min 2X/week    AM-PAC OT "6 Clicks" Daily Activity     Outcome Measure Help from another person eating meals?: None Help from another person taking care of personal grooming?: A Little Help from another person toileting, which includes using toliet, bedpan, or urinal?: A Little Help from another person bathing (including washing, rinsing, drying)?: A Little Help from another person to put on and taking off regular upper body clothing?: A Little Help from another person to put on and taking off regular lower body clothing?: A Little 6 Click Score: 19   End of Session Equipment Utilized During Treatment: Rolling walker Nurse Communication: Mobility status;Patient requests pain meds  Activity Tolerance: Patient tolerated treatment well Patient left: in chair;with call bell/phone within reach;with nursing/sitter in room  OT Visit Diagnosis: Pain Pain - Right/Left: Right Pain - part of body: Leg;Hip                Time: 0837-0900 OT Time Calculation (min): 23 min Charges:  OT General Charges $OT  Visit: 1 Visit OT Evaluation $OT Eval Moderate Complexity: 1 Mod OT Treatments $Self Care/Home Management : 8-22 mins  Delbert Phenix OT Pager: [phone]  Rosemary Holms 02/28/2020, 1:30 PM

## 2020-02-28 NOTE — Progress Notes (Signed)
Samantha Lozano   DOB:1946/02/28   SW#:[id number]   GYK#:[id number]  Subjective:  Per denies pain is still not entirely controlled.  Surprisingly sometimes when she walks to the bathroom the pain feels better but while lying down and movement makes the pain worse.  She remains constipated.  Palliative care is assisting with pain management and bowel prophylaxis.  Son [name] and daughter-in-law [name] are present in the room   Objective: White woman examined in bed Vitals:   02/28/20 0908 02/28/20 1356  BP: (!) 187/98 (!) 172/87  Pulse: (!) 56 68  Resp:  18  Temp:  98.2 F (36.8 C)  SpO2: 93% 90%    Body mass index is 41.09 kg/m.  Intake/Output Summary (Last 24 hours) at 02/28/2020 1433 Last data filed at 02/28/2020 1000 Gross per 24 hour  Intake 1200 ml  Output --  Net 1200 ml     Sclerae unicteric  Lungs no rales or wheezes--auscultated anterolaterally  Heart regular rate and rhythm  Abdomen soft, +BS  Neuro nonfocal  MSK: Tumor nodule in the right lower extremity area, not erythematous, nontender, measuring approximately 2-1/2 cm  Breast exam: Deferred  CBG (last 3)  Recent Labs    02/27/20 2149 02/28/20 0736 02/28/20 1139  GLUCAP 142* 110* 128*     Labs:  Lab Results  Component Value Date   WBC 6.1 02/27/2020   HGB 11.1 (L) 02/27/2020   HCT 34.3 (L) 02/27/2020   MCV 86.0 02/27/2020   PLT 161 02/27/2020   NEUTROABS 5.1 02/26/2020    '@LASTCHEMISTRY' @  Urine Studies No results for input(s): UHGB, CRYS in the last 72 hours.  Invalid input(s): UACOL, UAPR, USPG, UPH, UTP, UGL, UKET, UBIL, UNIT, UROB, ULEU, UEPI, UWBC, URBC, UBAC, CAST, UCOM, BILUA  Basic Metabolic Panel: Recent Labs  Lab 02/26/20 0938 02/28/20 0717  NA 139 135  K 3.5 3.2*  CL 102 103  CO2 24 24  GLUCOSE 125* 107*  BUN 14 18  CREATININE 0.65 0.74  CALCIUM 9.7 9.0  MG  --  1.8   GFR Estimated Creatinine Clearance: 71 mL/min (by C-G formula based on SCr of 0.74 mg/dL). Liver  Function Tests: Recent Labs  Lab 02/26/20 0938  AST 22  ALT 20  ALKPHOS 88  BILITOT 1.0  PROT 8.8*  ALBUMIN 4.9   No results for input(s): LIPASE, AMYLASE in the last 168 hours. No results for input(s): AMMONIA in the last 168 hours. Coagulation profile No results for input(s): INR, PROTIME in the last 168 hours.  CBC: Recent Labs  Lab 02/26/20 0938 02/27/20 0643  WBC 8.1 6.1  NEUTROABS 5.1  --   HGB 13.6 11.1*  HCT 41.8 34.3*  MCV 85.5 86.0  PLT 138* 161   Cardiac Enzymes: Recent Labs  Lab 02/26/20 0938  CKTOTAL 133   BNP: Invalid input(s): POCBNP CBG: Recent Labs  Lab 02/27/20 1149 02/27/20 1641 02/27/20 2149 02/28/20 0736 02/28/20 1139  GLUCAP 126* 109* 142* 110* 128*   D-Dimer No results for input(s): DDIMER in the last 72 hours. Hgb A1c Recent Labs    02/27/20 0643  HGBA1C 6.7*   Lipid Profile No results for input(s): CHOL, HDL, LDLCALC, TRIG, CHOLHDL, LDLDIRECT in the last 72 hours. Thyroid function studies No results for input(s): TSH, T4TOTAL, T3FREE, THYROIDAB in the last 72 hours.  Invalid input(s): FREET3 Anemia work up No results for input(s): VITAMINB12, FOLATE, FERRITIN, TIBC, IRON, RETICCTPCT in the last 72 hours. Microbiology Recent Results (from the past 240  hour(s))  SARS Coronavirus 2 by RT PCR (hospital order, performed in [facility] hospital lab) Nasopharyngeal Nasopharyngeal Swab     Status: None   Collection Time: 02/26/20  4:10 PM   Specimen: Nasopharyngeal Swab  Result Value Ref Range Status   SARS Coronavirus 2 NEGATIVE NEGATIVE Final    Comment: (NOTE) SARS-CoV-2 target nucleic acids are NOT DETECTED.  The SARS-CoV-2 RNA is generally detectable in upper and lower respiratory specimens during the acute phase of infection. The lowest concentration of SARS-CoV-2 viral copies this assay can detect is 250 copies / mL. A negative result does not preclude SARS-CoV-2 infection and should not be used as the sole basis for  treatment or other patient management decisions.  A negative result may occur with improper specimen collection / handling, submission of specimen other than nasopharyngeal swab, presence of viral mutation(s) within the areas targeted by this assay, and inadequate number of viral copies (<250 copies / mL). A negative result must be combined with clinical observations, patient history, and epidemiological information.  Fact Sheet for Patients:   StrictlyIdeas.no  Fact Sheet for Healthcare Providers: BankingDealers.co.za  This test is not yet approved or  cleared by the [place] FDA and has been authorized for detection and/or diagnosis of SARS-CoV-2 by FDA under an Emergency Use Authorization (EUA).  This EUA will remain in effect (meaning this test can be used) for the duration of the COVID-19 declaration under Section 564(b)(1) of the Act, 21 U.S.C. section 360bbb-3(b)(1), unless the authorization is terminated or revoked sooner.  Performed at [facility], [place] [address] [place], [place] [address]       Studies:  MR HIP RIGHT W WO CONTRAST  Result Date: 02/26/2020 CLINICAL DATA:  Severe right mid back pain radiating into the right leg today. No acute injury. Metastatic carcinoma of unknown primary. EXAM: MRI OF THE RIGHT HIP WITHOUT AND WITH CONTRAST TECHNIQUE: Multiplanar, multisequence MR imaging was performed both before and after administration of intravenous contrast. CONTRAST:  76m GADAVIST GADOBUTROL 1 MMOL/ML IV SOLN COMPARISON:  PET-CT 02/05/2020. FINDINGS: Despite efforts by the technologist and patient, mild motion artifact is present on today's exam and could not be eliminated. This reduces exam sensitivity and specificity. Bones: There is widespread osseous metastatic disease which appears progressive compared with previous PET-CT. Largest lesions include a 3.3 cm in the subtrochanteric region of  the proximal right femur (image 14/6) and a 2.1 cm lesion near the right anterior inferior iliac spine (image 5/6). Additional lesions are present throughout the bony pelvis, including bilateral sacral lesions and prominent involvement of the L5 vertebral body on the right. There is a large lesion involving the right iliac crest with extraosseous soft tissue extension, measuring up to 4.8 cm on image 10/4. There is a small lesion in the proximal left femoral diaphysis. No definite pathologic fracture at this time. Articular cartilage and labrum Articular cartilage: No focal chondral defect or subchondral signal abnormality identified. Labrum: There is no gross labral tear or paralabral abnormality. Joint or bursal effusion Joint effusion: No significant hip joint effusion. Bursae: No focal periarticular fluid collection. Muscles and tendons Muscles and tendons: Partial tear of the left common hamstring tendon. The gluteus and iliopsoas tendons appear intact. The location of the lesion at the right anterior inferior iliac spine predisposes to avulsive injury of the rectus femoris tendon. Other findings Miscellaneous: 1.7 cm subcutaneous metastasis noted superficial to the right tensor fascia lata muscle on image 5/6. IMPRESSION: 1. Progressive widespread  osseous metastatic disease throughout the bony pelvis and proximal right femur. No definite pathologic fracture at this time. 2. Large lesion involving the right iliac CREST with extraosseous soft tissue extension. The location of the lesion at the right anterior inferior iliac spine predisposes to avulsive injury of the rectus femoris tendon. 3. 1.7 cm subcutaneous metastasis superficial to the right tensor fascia lata muscle. 4. Partial tear of the left common hamstring tendon. Electronically Signed   By: Richardean Sale M.D.   On: 02/26/2020 15:16    Assessment: 74 y.o. [facility] woman presenting 01/10/2020 with pleuritic chest pain, chest CT scan same day  showing innumerable bilateral pulmonary nodules, largest in the superior left lower lobe measuring 2.5 cm largest on the right side in the lower lobe at 1.9 cm.  Scan also showed lytic lesions at T12, multiple ribs, and right humeral head             (a) CEA, CA 19-9, CA 27-29 all within normal limits 01/13/2020             (b) bone marrow biopsy 01/15/2020 nondiagnostic             (c) right breast mammography and ultrasonography led to biopsy [id number], all benign             (d) head CT with and without contrast 01/21/2020 no evidence of brain metastases             (e) PET scan 02/05/2020 shows multiple lung lesions, a soft tissue mass in the lower left back adjacent to the 12th rib, no abnormal liver activity, no hypermetabolic activity in the colon, pancreas, spleen, or adrenals, expansile lytic lesion in the medial right iliac bone measuring 3.5 cm with SUV of 4.0 and a smaller lytic lesion in the right iliac bone adjacent to the acetabulum measuring 1.5 cm with an SUV max of 4.             (f) CT biopsy obtained 02/17/2020 from the paraspinal mass shows ([phone]) spindle cell malignancy consistent with leiomyosarcoma, low-grade (0 necrosis, 1 differentiation, 2 mitotic rate = 3)  (1) consult placed to radiation oncology for palliative radiation for pain control  (2) appointment requested at the [facility] sarcoma clinic for second opinion regarding further management   Plan:  I reviewed the MRI results with Teyona and her son and daughter-in-law.  There is significant bone involvement in the right hip area and I believe palliative radiation there may significantly help with pain control.  The consult has been placed and I will call tomorrow morning for follow-up.  Her pain is still not well controlled and she is not getting out of bed much.  In addition she is constipated from the narcotics.  We appreciate the help of palliative care in management of these problems.  I discussed with the  patient and her family that her sarcoma appears to be low-grade.  In a way this is good since it means she may live longer than otherwise.  On the other hand it is likely to mean that chemotherapy would be pretty useless since chemotherapy is most effective with rapidly growing tumors.  I am hopeful that we can find a well-tolerated treatment that may be effective for her or possibly a study she may participate in.  I will follow up on our contacts with [facility] to see if we can get her there within the next week or 10 days  Greatly appreciate your  help to this patient and her family  We will continue to follow with you   Chauncey Cruel, MD 02/28/2020  2:33 PM Medical Oncology and Hematology [facility] [address] [place], [place] [address] Tel. [phone]    Fax. [phone]

## 2020-02-28 NOTE — Progress Notes (Signed)
Daily Progress Note   Patient Name: Samantha Lozano       Date: 02/28/2020 DOB: 04-07-1946  Age: 74 y.o. MRN#: [id number] Attending Physician: Samantha Slocumb, DO Primary Care Physician: Samantha Melter, MD Admit Date: 02/26/2020  Reason for Consultation/Follow-up: Establishing goals of care, Non pain symptom management and Pain control  Subjective: I saw and examined Samantha Lozano today.  Dr. Jana Lozano also arrived to her room partway through my visit and joined [name] for the end of my encounter.  Her daughter-in-law, Samantha Lozano, and her son Samantha Lozano with the bedside as well.  We discussed her pain regimen and her pain management overnight.  She reports pain is a little bit worse than it was yesterday overall.  She reports getting relief from oral oxycodone, but states that it does not last very long.  She continues to use oral oxycodone and intermittent IV Dilaudid in addition to MS Contin that was started yesterday.  She also reports that it has been since Thursday since she had a bowel movement.  I did increase her regimen to Senokot 2 tabs twice daily, but discussed with her the need for a bowel movement and plan to increase MiraLAX as well.  If she does not have bowel movement by tomorrow, discussed possible need for enema/suppository.  Length of Stay: 2  Current Medications: Scheduled Meds:  . acetaminophen  1,000 mg Oral Q8H  . aspirin EC  81 mg Oral Daily  . carvedilol  25 mg Oral BID WC  . dexamethasone  4 mg Oral Q12H  . enoxaparin (LOVENOX) injection  40 mg Subcutaneous Q24H  . hydrochlorothiazide  25 mg Oral Daily  . insulin aspart  0-9 Units Subcutaneous TID WC  . levothyroxine  125 mcg Oral Q0600  . losartan  50 mg Oral Daily  . methocarbamol  1,000 mg Oral QID  . morphine  60  mg Oral Q12H  . omega-3 acid ethyl esters  1 g Oral Daily  . polyethylene glycol  17 g Oral BID  . potassium chloride  40 mEq Oral Q4H  . pravastatin  40 mg Oral q1800  . senna  2 tablet Oral BID    Continuous Infusions: . sodium chloride 10 mL/hr (02/26/20 0933)    PRN Meds: bisacodyl, hydrALAZINE, HYDROmorphone (DILAUDID) injection, magnesium citrate, ondansetron **OR** ondansetron (ZOFRAN)  IV, oxyCODONE, traZODone  Physical Exam         General: Alert, awake, in no acute distress.  Heart: Regular rate and rhythm. No murmur appreciated. Lungs: Good air movement, clear Abdomen: Soft, nontender, nondistended, positive bowel sounds.  Ext: No significant edema Skin: Warm and dry Neuro: Grossly intact, nonfocal.  Vital Signs: BP (!) 187/98 (BP Location: Right Arm)   Pulse (!) 56   Temp 98 F (36.7 C) (Oral)   Resp 18   Ht 5\' 2"  (1.575 m)   Wt 101.9 kg   SpO2 93%   BMI 41.09 kg/m  SpO2: SpO2: 93 % O2 Device: O2 Device: Room Air O2 Flow Rate:    Intake/output summary:   Intake/Output Summary (Last 24 hours) at 02/28/2020 1335 Last data filed at 02/28/2020 1000 Gross per 24 hour  Intake 1725.67 ml  Output --  Net 1725.67 ml   LBM: Last BM Date: 02/25/20 Baseline Weight: Weight: 99.8 kg Most recent weight: Weight: 101.9 kg       Palliative Assessment/Data:      Patient Active Problem List   Diagnosis Date Noted  . Bony metastasis ([place]) 02/26/2020  . Intractable pain 02/26/2020  . Leiomyosarcoma ([place]) 02/26/2020  . Hypothyroidism 02/26/2020  . Bone metastases ([place]) 01/22/2020  . Lung metastases ([place]) 01/13/2020  . Osteoarthritis of both knees 12/14/2019  . Pain in both knees 11/26/2019  . Essential hypertension 09/16/2019  . Depression 07/06/2019  . Class 2 severe obesity with serious comorbidity and body mass index (BMI) of 36.0 to 36.9 in adult [facility]) 07/30/2018  . Other fatigue 03/10/2018  . Shortness of breath on exertion 03/10/2018  . Type 2  diabetes mellitus ([place]) 03/10/2018  . Abnormal EKG 03/10/2018  . Vitamin D deficiency 03/10/2018  . B12 nutritional deficiency 03/10/2018    Palliative Care Assessment & Plan   Patient Profile: 74 year old female with newly diagnosed leiomyosarcoma with bony mets admitted for uncontrolled cancer related pain.  Assessment: Patient Active Problem List   Diagnosis Date Noted  . Bony metastasis ([place]) 02/26/2020  . Intractable pain 02/26/2020  . Leiomyosarcoma ([place]) 02/26/2020  . Hypothyroidism 02/26/2020  . Bone metastases ([place]) 01/22/2020  . Lung metastases ([place]) 01/13/2020  . Osteoarthritis of both knees 12/14/2019  . Pain in both knees 11/26/2019  . Essential hypertension 09/16/2019  . Depression 07/06/2019  . Class 2 severe obesity with serious comorbidity and body mass index (BMI) of 36.0 to 36.9 in adult [facility]) 07/30/2018  . Other fatigue 03/10/2018  . Shortness of breath on exertion 03/10/2018  . Type 2 diabetes mellitus ([place]) 03/10/2018  . Abnormal EKG 03/10/2018  . Vitamin D deficiency 03/10/2018  . B12 nutritional deficiency 03/10/2018    Recommendations/Plan:   Cancer-related pain: MAR reveals that she has had Dilaudid 1 mg x 2 doses (oral morphine equivalent of 40 mg), MS Contin 30 mg x 2 doses (oral morphine equivalent of 60 mg), and oxycodone 15 mg x 5 doses (oral morphine equivalent of 112.5 mg).  Her overall 24-hour oral morphine equivalent is therefore 212.5 mg.  This is actually similar to her previous 24 hours in terms of total opioid usage.  We will therefore plan to increase MS Contin to 60 mg twice daily.  Continue oxycodone 15 mg every 3 hours as breakthrough pain with second line pain medication of Dilaudid 1 mg to be given 1 hour after oral oxycodone oral oxycodone is ineffective to relieve her pain.  Continue adjunct treatments of Robaxin, scheduled Tylenol, and  steroids.  Dr. Jana Lozano is planning to reach out to radiation oncology to discuss her case  tomorrow.  I do think she has high likelihood of being benefited by radiation for her bony lesions.  We will continue to follow and adjust medications based upon her clinical course.  Constipation, opioid related: Senna 2 tabs twice daily.  Will increase MiraLAX to use twice daily schedule until she has a bowel movement.  Consideration for enema and/or suppository if constipation remains an issue.  Goals of care: Await input from [place] regarding potential disease modifying therapy options.  I did drop off a copy of hard choices for loving people for her and her family to review.  Goals of Care and Additional Recommendations:  Limitations on Scope of Treatment: Full Scope Treatment  Code Status:    Code Status Orders  (From admission, onward)         Start     Ordered   02/26/20 1815  Full code  Continuous        02/26/20 1820        Code Status History    This patient has a current code status but no historical code status.   Advance Care Planning Activity    Advance Directive Documentation     Most Recent Value  Type of Advance Directive Healthcare Power of Attorney, Living will  Pre-existing out of facility DNR order (yellow form or pink MOST form) --  "MOST" Form in Place? --       Prognosis:   Unable to determine  Discharge Planning:  Home with Home Health most likely  Care plan was discussed with patient, family, and Dr. Jana Lozano.  Thank you for allowing the Palliative Medicine Team to assist in the care of this patient.   Time In: 1230 Time Out: 1310 Total Time 40 Prolonged Time Billed No      Greater than 50%  of this time was spent counseling and coordinating care related to the above assessment and plan.  Micheline Rough, MD  Please contact Palliative Medicine Team phone at [phone] for questions and concerns.

## 2020-02-28 NOTE — Progress Notes (Signed)
Patient sat up in chair x2 today. K-pad to her right hip.

## 2020-02-29 ENCOUNTER — Ambulatory Visit
Admit: 2020-02-29 | Discharge: 2020-02-29 | Disposition: A | Discharge status: Home / Self Care | Payer: PPO | Source: Ambulatory Visit | Attending: Radiation Oncology | Admitting: Radiation Oncology

## 2020-02-29 ENCOUNTER — Encounter: Payer: Self-pay | Admitting: *Deleted

## 2020-02-29 ENCOUNTER — Telehealth: Payer: Self-pay | Admitting: Oncology

## 2020-02-29 DIAGNOSIS — Z510 Encounter for antineoplastic radiation therapy: Secondary | ICD-10-CM | POA: Insufficient documentation

## 2020-02-29 DIAGNOSIS — C499 Malignant neoplasm of connective and soft tissue, unspecified: Secondary | ICD-10-CM

## 2020-02-29 DIAGNOSIS — C7951 Secondary malignant neoplasm of bone: Secondary | ICD-10-CM

## 2020-02-29 LAB — BASIC METABOLIC PANEL
Anion gap: 11 (ref 5–15)
BUN: 26 mg/dL — ABNORMAL HIGH (ref 8–23)
CO2: 24 mmol/L (ref 22–32)
Calcium: 9.3 mg/dL (ref 8.9–10.3)
Chloride: 99 mmol/L (ref 98–111)
Creatinine, Ser: 0.83 mg/dL (ref 0.44–1.00)
GFR calc Af Amer: >60 mL/min (ref >60)
GFR calc non Af Amer: >60 mL/min (ref >60)
Glucose, Bld: 193 mg/dL — ABNORMAL HIGH (ref 70–99)
Potassium: 4.1 mmol/L (ref 3.5–5.1)
Sodium: 134 mmol/L — ABNORMAL LOW (ref 135–145)

## 2020-02-29 LAB — GLUCOSE, CAPILLARY
Glucose-Capillary: 189 mg/dL — ABNORMAL HIGH (ref 70–99)
Glucose-Capillary: 155 mg/dL — ABNORMAL HIGH (ref 70–99)
Glucose-Capillary: 173 mg/dL — ABNORMAL HIGH (ref 70–99)
Glucose-Capillary: 181 mg/dL — ABNORMAL HIGH (ref 70–99)

## 2020-02-29 LAB — MAGNESIUM: Magnesium: 2 mg/dL (ref 1.7–2.4)

## 2020-02-29 MED ORDER — FLEET ENEMA 7-19 GM/118ML RE ENEM
1.0000 | ENEMA | Freq: Once | RECTAL | Status: AC
  Administered 2020-02-29: 1 via RECTAL
  Filled 2020-02-29: qty 1

## 2020-02-29 MED ORDER — OXYCODONE HCL 5 MG PO TABS
20.0000 mg | ORAL_TABLET | ORAL | Status: DC | PRN
  Administered 2020-02-29 – 2020-03-02 (×5): 20 mg via ORAL
  Filled 2020-02-29 (×6): qty 4

## 2020-02-29 NOTE — Progress Notes (Signed)
Patient sat up in the chair a few times, states feeling better when sitting in chair than laying in bed. Appetite is good. Family in visiting. Khila given Fleets enema and had large results.

## 2020-02-29 NOTE — Progress Notes (Signed)
Per MD request, RN successfully faxed [phone]) referral to Dr. Acey Lav with [facility] [phone]) for a second opionin related to metastatic leiomyosarcoma stating pt needing to be seen within the next 2 weeks.

## 2020-02-29 NOTE — Consult Note (Signed)
Radiation Oncology         [phone] ________________________________  Name: Samantha Lozano        MRN: [id number]  Date of Service: 02/29/20 DOB: 07-17-1946  VQ:[id number], Samantha Main, MD    REFERRING PHYSICIAN: Dr. Jana Hakim  DIAGNOSIS: The encounter diagnosis was Metastatic cancer to pelvis [facility]).   HISTORY OF PRESENT ILLNESS: Samantha Lozano is a 74 y.o. female seen at the request of Dr. Jana Hakim for a newly diagnosed leiomyosarcoma.  The patient has been experiencing symptoms of of pleuritic chest pain and a CT scan in May 2021 revealed innumerable bilateral pulmonary nodules the largest measuring 2.5 cm in the left lower lobe, and a second measuring 1.9 cm in the right lower lobe.  She also had disease at T12 multiple ribs in the right humeral head.  A bone marrow biopsy on 01/15/2020 was nondiagnostic, she also had breast imaging that was negative for disease.  A CT scan of the brain without contrast on 513 was negative for metastatic disease, and a PET scan on 02/05/2020 revealed multiple lung lesions, soft tissue mass in the left back adjacent to the 12th rib, a 3.5 cm expansile lytic lesion in the right iliac bone with an SUV of 4 smaller right iliac bone adjacent to the acetabulum measuring 1.5 cm as well as the right femur.  This was noted more proximally.  A CT of the paraspinal mass on 02/17/2020 revealed a spindle cell malignancy most consistent with a leiomyosarcoma.  It was felt to be low-grade, she is pursuing a second opinion at [facility] [facility] in the near future.  She was admitted however on 02/26/2020 with increasing pain.  She had plain films of her thoracic spine lumbar spine and tibia/tibia.  All of which are negative for acute findings but revealed degenerative changes, no disease at the T12 and long regions, and less definitive findings on her plain films otherwise.  She had arthritic changes at the mid ankle without focal findings otherwise.  An MRI of the hip with and without  contrast on 02/26/2020 revealed progressive widespread osseous disease throughout the bony pelvis and proximal right femur, no definitive pathologic fracture was identified, she also had a large lesion of the right iliac crest with extraosseous soft tissue extension in the location of the lesion of the right anterior inferior iliac spine predisposes her to avulsive injury of the rectus femoris tendon, a 1.7 cm subcutaneous metastasis was also noted superficial to the right tensor fossa lata muscle.  She also had an unrelated partial tear of the left common hamstring tendon.  Given these findings palliative care has been helpful at trying to help with pain management.  We are asked to see the patient to consider palliative radiotherapy.     PREVIOUS RADIATION THERAPY: No   PAST MEDICAL HISTORY:  Past Medical History:  Diagnosis Date  . Diabetes mellitus, type 2 ([place])   . HLD (hyperlipidemia)   . HTN (hypertension)   . Leg edema   . Other specified disorders of thyroid        PAST SURGICAL HISTORY: Past Surgical History:  Procedure Laterality Date  . ABDOMINAL HYSTERECTOMY    . THYROID SURGERY       FAMILY HISTORY:  Family History  Problem Relation Age of Onset  . Diabetes Mother   . Hypertension Mother   . Cancer Mother   . Obesity Mother   . Hypertension Father   . Cancer Father   . Obesity Father  SOCIAL HISTORY:  reports that she has never smoked. She has never used smokeless tobacco. She reports previous alcohol use. She reports previous drug use.The patient is widowed and lives in [place]. Her daughter Samantha Lozano works in Management consultant Medicine and her granddaughter Samantha Lozano is a Oncologist as well whom we work with frequently.   ALLERGIES: Lisinopril   MEDICATIONS:  Current Facility-Administered Medications  Medication Dose Route Frequency Provider Last Rate Last Admin  . 0.9 %  sodium chloride infusion   Intravenous Continuous Lacretia Leigh, MD 10  mL/hr at 02/26/20 0933 10 mL/hr at 02/26/20 0933  . acetaminophen (TYLENOL) tablet 1,000 mg  1,000 mg Oral Q8H Griffith, Kelly A, DO   1,000 mg at 02/29/20 2426  . aspirin EC tablet 81 mg  81 mg Oral Daily Nicole Kindred A, DO   81 mg at 02/29/20 1122  . bisacodyl (DULCOLAX) EC tablet 5 mg  5 mg Oral Daily PRN Nicole Kindred A, DO      . carvedilol (COREG) tablet 25 mg  25 mg Oral BID WC Lacretia Leigh, MD   25 mg at 02/29/20 0816  . dexamethasone (DECADRON) tablet 4 mg  4 mg Oral Q12H Micheline Rough, MD   4 mg at 02/29/20 1121  . enoxaparin (LOVENOX) injection 40 mg  40 mg Subcutaneous Q24H Nicole Kindred A, DO   40 mg at 02/28/20 2121  . hydrALAZINE (APRESOLINE) tablet 25 mg  25 mg Oral Q6H PRN Nicole Kindred A, DO      . hydrochlorothiazide (HYDRODIURIL) tablet 25 mg  25 mg Oral Daily Lacretia Leigh, MD   25 mg at 02/29/20 1119  . HYDROmorphone (DILAUDID) injection 1 mg  1 mg Intravenous Q3H PRN Micheline Rough, MD   1 mg at 02/28/20 2035  . insulin aspart (novoLOG) injection 0-9 Units  0-9 Units Subcutaneous TID WC Nicole Kindred A, DO   2 Units at 02/29/20 8341  . levothyroxine (SYNTHROID) tablet 125 mcg  125 mcg Oral Q0600 Nicole Kindred A, DO   125 mcg at 02/29/20 9622  . losartan (COZAAR) tablet 50 mg  50 mg Oral Daily Lacretia Leigh, MD   50 mg at 02/29/20 1122  . magnesium citrate solution 1 Bottle  1 Bottle Oral Once PRN Ezekiel Slocumb, DO      . methocarbamol (ROBAXIN) tablet 1,000 mg  1,000 mg Oral QID Nicole Kindred A, DO   1,000 mg at 02/29/20 1121  . morphine (MS CONTIN) 12 hr tablet 60 mg  60 mg Oral Q12H Domingo Cocking, Gene, MD   60 mg at 02/29/20 1119  . omega-3 acid ethyl esters (LOVAZA) capsule 1 g  1 g Oral Daily Nicole Kindred A, DO   1 g at 02/29/20 1118  . ondansetron (ZOFRAN) tablet 4 mg  4 mg Oral Q6H PRN Nicole Kindred A, DO       Or  . ondansetron (ZOFRAN) injection 4 mg  4 mg Intravenous Q6H PRN Nicole Kindred A, DO      . oxyCODONE (Oxy IR/ROXICODONE) immediate  release tablet 15 mg  15 mg Oral Q3H PRN Micheline Rough, MD   15 mg at 02/29/20 2979  . polyethylene glycol (MIRALAX / GLYCOLAX) packet 17 g  17 g Oral BID Micheline Rough, MD   17 g at 02/29/20 1118  . pravastatin (PRAVACHOL) tablet 40 mg  40 mg Oral q1800 Nicole Kindred A, DO   40 mg at 02/28/20 1713  . senna (SENOKOT) tablet 17.2 mg  2 tablet  Oral BID Micheline Rough, MD   17.2 mg at 02/29/20 1120  . traZODone (DESYREL) tablet 50 mg  50 mg Oral QHS PRN Nicole Kindred A, DO         REVIEW OF SYSTEMS: On review of systems, the patient reports that she has been having terrible low back and right hip pain that shoots down the right leg which originally they thought was related to sciatica. She reports her right ankle has swollen recently as well and she has some significant pain with weight bearing as well. She is having pain in the mid to low back at the site she had biopsied as well. She does not have any loss of sensation but is having pain in the mid back along this region. She has been constipated with cathartics. No other complaints are noted.      PHYSICAL EXAM:  Wt Readings from Last 3 Encounters:  02/28/20 224 lb 10.4 oz (101.9 kg)  02/17/20 221 lb (100.2 kg)  01/22/20 221 lb 4.8 oz (100.4 kg)   Temp Readings from Last 3 Encounters:  02/29/20 98.3 F (36.8 C) (Oral)  02/17/20 98 F (36.7 C) (Oral)  01/22/20 98.5 F (36.9 C) (Temporal)   BP Readings from Last 3 Encounters:  02/29/20 132/77  02/17/20 (!) 148/83  01/22/20 (!) 154/75   Pulse Readings from Last 3 Encounters:  02/29/20 60  02/17/20 (!) 58  01/22/20 (!) 59   Pain Assessment Pain Score: 2 /10  Unable to assess due to encounter type.  ECOG = 3  0 - Asymptomatic (Fully active, able to carry on all predisease activities without restriction)  1 - Symptomatic but completely ambulatory (Restricted in physically strenuous activity but ambulatory and able to carry out work of a light or sedentary nature. For  example, light housework, office work)  2 - Symptomatic, <50% in bed during the day (Ambulatory and capable of all self care but unable to carry out any work activities. Up and about more than 50% of waking hours)  3 - Symptomatic, >50% in bed, but not bedbound (Capable of only limited self-care, confined to bed or chair 50% or more of waking hours)  4 - Bedbound (Completely disabled. Cannot carry on any self-care. Totally confined to bed or chair)  5 - Death   Eustace Pen MM, Creech RH, Tormey DC, et al. [phone]). "Toxicity and response criteria of the [facility]". [place] Oncol. 5 (6): 649-55    LABORATORY DATA:  Lab Results  Component Value Date   WBC 6.1 02/27/2020   HGB 11.1 (L) 02/27/2020   HCT 34.3 (L) 02/27/2020   MCV 86.0 02/27/2020   PLT 161 02/27/2020   Lab Results  Component Value Date   NA 134 (L) 02/29/2020   K 4.1 02/29/2020   CL 99 02/29/2020   CO2 24 02/29/2020   Lab Results  Component Value Date   ALT 20 02/26/2020   AST 22 02/26/2020   ALKPHOS 88 02/26/2020   BILITOT 1.0 02/26/2020      RADIOGRAPHY: DG Thoracic Spine 4V  Result Date: 02/26/2020 CLINICAL DATA:  Known metastatic disease. Unknown primary. Sudden onset of mid to low back pain extending into the right lower extremity. EXAM: THORACIC SPINE - 4+ VIEW COMPARISON:  CT of the chest 01/10/2020 FINDINGS: 12 rib-bearing thoracic type vertebral bodies are present. Progressive diffuse pulmonary metastases are noted. Vertebral body heights maintained. No acute or healing fractures are present. Degenerative endplate changes are noted throughout the  thoracic spine. Sternum and visualized ribs are within normal limits. Degenerative changes are present the lower cervical spine as well. IMPRESSION: 1. Progressive diffuse pulmonary metastases. 2. No acute or healing fractures. 3. Degenerative changes throughout the thoracic and cervical spine. Electronically Signed   By: San Morelle  M.D.   On: 02/26/2020 09:28   DG Lumbar Spine Complete  Result Date: 02/26/2020 CLINICAL DATA:  Severe right sided mid back pain and leg pain beginning at 1 a.m. Known metastatic disease the bone. EXAM: LUMBAR SPINE - COMPLETE 4+ VIEW COMPARISON:  PET scan 02/05/2020. FINDINGS: Five non rib-bearing lumbar type vertebral bodies are present. Vertebral body heights are maintained. No acute or healing fractures are present. No significant listhesis is present. Advanced degenerative changes are noted in the lower lumbar facets. Metastatic disease is less apparent by plain film radiographs. Soft tissues are unremarkable. Atherosclerotic calcifications are present. IMPRESSION: 1. Advanced degenerative changes in the lower lumbar facets without acute or healing fractures. 2. Metastatic disease is less apparent by plain film radiographs. Electronically Signed   By: San Morelle M.D.   On: 02/26/2020 09:25   DG Tibia/Fibula Right  Result Date: 02/26/2020 CLINICAL DATA:  Back pain radiating into the right lower leg. No known injury. EXAM: RIGHT TIBIA AND FIBULA - 2 VIEW COMPARISON:  None. FINDINGS: There is no acute bony or joint abnormality. Osteoarthritis about the knee is most severe in the patellofemoral compartment. Degenerative change at the tibiotalar joint and articulations of the midfoot is also identified. Calcaneal spurring is present. Soft tissues are unremarkable. IMPRESSION: No acute abnormality. Multifocal osteoarthritis. Electronically Signed   By: Inge Rise M.D.   On: 02/26/2020 09:24   MR HIP RIGHT W WO CONTRAST  Result Date: 02/26/2020 CLINICAL DATA:  Severe right mid back pain radiating into the right leg today. No acute injury. Metastatic carcinoma of unknown primary. EXAM: MRI OF THE RIGHT HIP WITHOUT AND WITH CONTRAST TECHNIQUE: Multiplanar, multisequence MR imaging was performed both before and after administration of intravenous contrast. CONTRAST:  49m GADAVIST GADOBUTROL 1  MMOL/ML IV SOLN COMPARISON:  PET-CT 02/05/2020. FINDINGS: Despite efforts by the technologist and patient, mild motion artifact is present on today's exam and could not be eliminated. This reduces exam sensitivity and specificity. Bones: There is widespread osseous metastatic disease which appears progressive compared with previous PET-CT. Largest lesions include a 3.3 cm in the subtrochanteric region of the proximal right femur (image 14/6) and a 2.1 cm lesion near the right anterior inferior iliac spine (image 5/6). Additional lesions are present throughout the bony pelvis, including bilateral sacral lesions and prominent involvement of the L5 vertebral body on the right. There is a large lesion involving the right iliac crest with extraosseous soft tissue extension, measuring up to 4.8 cm on image 10/4. There is a small lesion in the proximal left femoral diaphysis. No definite pathologic fracture at this time. Articular cartilage and labrum Articular cartilage: No focal chondral defect or subchondral signal abnormality identified. Labrum: There is no gross labral tear or paralabral abnormality. Joint or bursal effusion Joint effusion: No significant hip joint effusion. Bursae: No focal periarticular fluid collection. Muscles and tendons Muscles and tendons: Partial tear of the left common hamstring tendon. The gluteus and iliopsoas tendons appear intact. The location of the lesion at the right anterior inferior iliac spine predisposes to avulsive injury of the rectus femoris tendon. Other findings Miscellaneous: 1.7 cm subcutaneous metastasis noted superficial to the right tensor fascia lata muscle on image 5/6.  IMPRESSION: 1. Progressive widespread osseous metastatic disease throughout the bony pelvis and proximal right femur. No definite pathologic fracture at this time. 2. Large lesion involving the right iliac CREST with extraosseous soft tissue extension. The location of the lesion at the right anterior  inferior iliac spine predisposes to avulsive injury of the rectus femoris tendon. 3. 1.7 cm subcutaneous metastasis superficial to the right tensor fascia lata muscle. 4. Partial tear of the left common hamstring tendon. Electronically Signed   By: Richardean Sale M.D.   On: 02/26/2020 15:16   NM PET Image Initial (PI) Skull Base To Thigh  Result Date: 02/05/2020 CLINICAL DATA:  Initial treatment strategy for carcinoma unknown primary. COVID vaccine LEFT arm EXAM: NUCLEAR MEDICINE PET SKULL BASE TO THIGH TECHNIQUE: 10.2 mCi F-18 FDG was injected intravenously. Full-ring PET imaging was performed from the skull base to thigh after the radiotracer. CT data was obtained and used for attenuation correction and anatomic localization. Portion the dose infiltrated in the RIGHT antecubital fossa. While there is intense activity in the arm, there is adequate radiotracer activity throughout the body to perform a diagnostic scan. Fasting blood glucose: 148 mg/dl COMPARISON:  Chest CT 220 FINDINGS: Mediastinal blood pool activity: SUV max 2.25 Liver activity: SUV max 3.6 NECK: No hypermetabolic lymph nodes in the neck. Incidental CT findings: none CHEST: Multiple round pulmonary nodules of varying size. These lesions of mixed metabolic activity. Some lesion without activity. Several lesions do have metabolic activity albeit moderate for size. For example, 1.8 cm lesion in the LEFT upper lobe (image 24) with SUV max equal 3.7. 1.7 cm nodule RIGHT lower lobe SUV max equal 2.5. These the several small nodules with activity. For example LEFT upper lobe nodule measuring 0.9 cm with SUV max 2.0 Within the paraspinal musculature of the lower LEFT back (adjacent to the LEFT twelfth rib), there is soft tissue mass measuring 4.0 cm (image 120/4) with moderate metabolic activity (SUV max equal 3.9. Incidental CT findings: none ABDOMEN/PELVIS: No abnormal activity in liver. Adrenal glands are normal. No hypermetabolic activity  associated with colon reproductive organs. Pancreas normal. Spleen normal. Incidental CT findings: Post hysterectomy.  Adnexa unremarkable. SKELETON: There is expansile lytic lesion within the medial RIGHT iliac bone measuring 3.5 cm (image 146/4) with SUV max equal 4.0. The small lytic lesion in the RIGHT iliac bone adjacent to the acetabulum. The soft tissue component measuring 1.5 cm SUV max equal 4. Mild radiotracer activity associated with a RIGHT lateral rib lesion with SUV max equal 3.0 on image 58. Incidental CT findings: Infiltration of radiotracer in the RIGHT arm IMPRESSION: 1. Unusual pattern of metastatic disease with multiple organ systems involved (pulmonary metastasis, bone metastasis, and muscle metastasis) and relatively moderate metabolic activity. Findings suggest a malignancy with relatively low FDG avidity such as thyroid carcinoma, renal cell carcinoma or myeloma. 2. Biopsy target sites could include the hypermetabolic soft tissue mass in the LEFT paraspinal musculature or one of the two hypermetabolic lytic lesions in the RIGHT iliac bone. 3. RIGHT arm radiotracer infiltration. Electronically Signed   By: Suzy Bouchard M.D.   On: 02/05/2020 09:32   CT Biopsy  Result Date: 02/17/2020 INDICATION: 74 year old female with multifocal metastatic disease of uncertain primary etiology. She has a muscular metastasis in the left paraspinal muscles and presents for CT-guided biopsy of the same. EXAM: CT-guided biopsy MEDICATIONS: None. ANESTHESIA/SEDATION: Moderate (conscious) sedation was employed during this procedure. A total of Versed 2 mg and Fentanyl 100 mcg was administered intravenously.  Moderate Sedation Time: 10 minutes. The patient's level of consciousness and vital signs were monitored continuously by radiology nursing throughout the procedure under my direct supervision. FLUOROSCOPY TIME:  None COMPLICATIONS: None immediate. PROCEDURE: Informed written consent was obtained from the  patient after a thorough discussion of the procedural risks, benefits and alternatives. All questions were addressed. A timeout was performed prior to the initiation of the procedure. A planning axial CT scan was performed. The soft tissue mass in the left paraspinal musculature was successfully identified. The overlying skin was sterilely prepped and draped in the standard fashion using chlorhexidine skin prep. Local anesthesia was attained by infiltration with 1% lidocaine. A small dermatotomy was made. Under intermittent CT guidance, a 17 gauge introducer needle was advanced and positioned at the margin of the mass. Multiple 18 gauge core biopsies were then coaxially obtained using the bio Pince automated biopsy device. Biopsy specimens were placed in formalin and delivered to pathology for further analysis. No immediate complication. The patient tolerated the procedure well. IMPRESSION: Successful CT-guided core biopsy of left paraspinal soft tissue mass. Electronically Signed   By: Jacqulynn Cadet M.D.   On: 02/17/2020 14:04       IMPRESSION/PLAN: 1. Metastatic leiomyosarcoma. Dr. Lisbeth Renshaw is aware of the patient's case and I spent time discussing the pathology findings and reviewed the nature of sarcomatous diseases effecting the bones and soft tissues. She appears to be a good candidate for palliative radiotherapy to the right iliac and femur regions. I suspect Dr. Lisbeth Renshaw will also want to consider treatment to the paraspinus mass and possibly T12. The patient is having pain in her right ankle but plain films have been negative to date. I will follow up with Dr. Lisbeth Renshaw as well regarding the ankle. We discussed the risks, benefits, short, and long term effects of radiotherapy, and the patient is interested in proceeding. I discussed  the delivery and logistics of radiotherapy and anticipates a course of 2-3 weeks of radiotherapy. She will simulate tomorrow at 1pm, and we anticipate starting therapy on  Wednesday this week. She will also be meeting with Dr. Jana Hakim and possibly at [facility] in the sarcoma clinic to coordinate systemic therapy.    In a visit lasting 45 minutes, greater than 50% of the time was spent by phone discussing the patient's condition, in preparation for the discussion, and coordinating the patient's care.    Carola Rhine, PAC

## 2020-02-29 NOTE — Care Management Important Message (Signed)
Important Message  Patient Details IM Letter given to Marney Doctor RN Case Manager to present to the Patient Name: Samantha Lozano MRN: [id number] Date of Birth: [birth year]   Medicare Important Message Given:  Yes     Kerin Salen 02/29/2020, 10:12 AM

## 2020-02-29 NOTE — Progress Notes (Signed)
Consult requested by Dr. Jana Hakim. Case reviewed, and will contact pt today. Coordinating for simulation tomorrow and to be able to start palliative xrt this Wednesday.    Carola Rhine, PAC

## 2020-02-29 NOTE — Progress Notes (Signed)
PROGRESS NOTE    Samantha Lozano   NWG:[id number]  DOB: November 13, 1945  PCP: Orpah Melter, MD    DOA: 02/26/2020 LOS: 3   Brief Narrative   Samantha Lozano is a 74 y.o. female with medical history significant of widespread metastatic disease with very recent tissue diagnosis of leiomyosarcoma grade 1, hypertension, hyperlipidemia, type 2 diabetes, obesity, osteoarthritis who presented to the ED on 02/26/20 for progressive right hip pain in addition to back, right knee, and right ankle pain.  MRI of the hip showed progressive widespread metastatic disease throughout the bony pelvis and proximal femur, with large lytic lesion on the right iliac crest (see report for other findings).  Admitted for management of intractable pain.  Oncology is following.   Assessment & Plan   Intractable pain due to bony metastatic disease Most pain currently in right hip, ankle, knee, back.    Palliative care for was consulted for pain management.  They have adjusted her pain medications.  MS Contin dose was increased.  She also remains on OxyIR.  IV Dilaudid as needed for breakthrough pain.  Bowel regimen.  Has not had a bowel movement in a few days.  Laxatives were increased yesterday.  May need enemas or suppositories.  PT evaluation.  It looks like the plan is to start palliative radiation on Wednesday.  Leiomyosarcoma  Followed by oncologist Dr. Jana Hakim.  Considering palliative radiation for bony mets and pain relief.  Planning for 2nd opinion at [facility].  Hypokalemia Potassium level corrected.  Magnesium 2.0.    Diabetes mellitus type 2 Metformin on hold.  SSI.  CBGs reasonably well controlled.  6.7.  Essential hypertension Occasional high readings noted.  Continue carvedilol and HCTZ.  No longer taking losartan.    Hypothyroidism Continue Synthroid  Morbid Obesity Body mass index is 41.09 kg/m.  Complicates overall care and prognosis.  DVT prophylaxis: enoxaparin (LOVENOX) injection 40 mg  Start: 02/26/20 2200   Diet:  Diet Orders (From admission, onward)    Start     Ordered   02/26/20 1825  Diet Carb Modified Fluid consistency: Thin; Room service appropriate? Yes  Diet effective now       Question Answer Comment  Diet-HS Snack? Nothing   Calorie Level Medium 1600-2000   Fluid consistency: Thin   Room service appropriate? Yes      02/26/20 1824            Code Status: Full Code    Subjective 02/29/20    Patient states that she is feeling better today.  Able to move her right leg better than yesterday.  Was hoping to go home today but understands that she may benefit from getting radiation treatment.  Waiting on radiation oncology team to evaluate.  No other complaints offered except for constipation.   Disposition Plan & Communication   Status is: Inpatient  Remains inpatient appropriate because:Ongoing active pain requiring inpatient pain management   Dispo: The patient is from: Home              Anticipated d/c is to: Home              Anticipated d/c date is: 2 days              Patient currently is not medically stable to d/c.    Family Communication: Discussed in detail with the patient.   Consults, Procedures, Significant Events   Consultants:   Oncology  Palliative Care  Procedures:   None  Antimicrobials:   None    Objective   Vitals:   02/28/20 0908 02/28/20 1356 02/28/20 2102 02/29/20 0629  BP: (!) 187/98 (!) 172/87 (!) 155/69 132/77  Pulse: (!) 56 68 66 60  Resp:  18 14 18   Temp:  98.2 F (36.8 C) 98.2 F (36.8 C) 98.3 F (36.8 C)  TempSrc:  Oral Oral Oral  SpO2: 93% 90% 93% 96%  Weight:      Height:        Intake/Output Summary (Last 24 hours) at 02/29/2020 1314 Last data filed at 02/29/2020 1214 Gross per 24 hour  Intake 720 ml  Output --  Net 720 ml   Filed Weights   02/26/20 0732 02/26/20 2019 02/28/20 0638  Weight: 99.8 kg 102.3 kg 101.9 kg    Physical Exam:  General appearance: Awake alert.  In  no distress Resp: Clear to auscultation bilaterally.  Normal effort Cardio: S1-S2 is normal regular.  No S3-S4.  No rubs murmurs or bruit GI: Abdomen is soft.  Nontender nondistended.  Bowel sounds are present normal.  No masses organomegaly Extremities: No edema.  Improved mobility of the right lower extremity. Neurologic: Alert and oriented x3.  No focal neurological deficits.    Labs   Data Reviewed: I have personally reviewed following labs and imaging studies  CBC: Recent Labs  Lab 02/26/20 0938 02/27/20 0643  WBC 8.1 6.1  NEUTROABS 5.1  --   HGB 13.6 11.1*  HCT 41.8 34.3*  MCV 85.5 86.0  PLT 138* 856   Basic Metabolic Panel: Recent Labs  Lab 02/26/20 0938 02/28/20 0717 02/29/20 0539  NA 139 135 134*  K 3.5 3.2* 4.1  CL 102 103 99  CO2 24 24 24   GLUCOSE 125* 107* 193*  BUN 14 18 26*  CREATININE 0.65 0.74 0.83  CALCIUM 9.7 9.0 9.3  MG  --  1.8 2.0   GFR: Estimated Creatinine Clearance: 68.5 mL/min (by C-G formula based on SCr of 0.83 mg/dL). Liver Function Tests: Recent Labs  Lab 02/26/20 0938  AST 22  ALT 20  ALKPHOS 88  BILITOT 1.0  PROT 8.8*  ALBUMIN 4.9   Cardiac Enzymes: Recent Labs  Lab 02/26/20 0938  CKTOTAL 133   HbA1C: Recent Labs    02/27/20 0643  HGBA1C 6.7*   CBG: Recent Labs  Lab 02/28/20 1139 02/28/20 1702 02/28/20 2103 02/29/20 0731 02/29/20 1128  GLUCAP 128* 195* 259* 189* 155*     Recent Results (from the past 240 hour(s))  SARS Coronavirus 2 by RT PCR (hospital order, performed in [facility] hospital lab) Nasopharyngeal Nasopharyngeal Swab     Status: None   Collection Time: 02/26/20  4:10 PM   Specimen: Nasopharyngeal Swab  Result Value Ref Range Status   SARS Coronavirus 2 NEGATIVE NEGATIVE Final    Comment: (NOTE) SARS-CoV-2 target nucleic acids are NOT DETECTED.  The SARS-CoV-2 RNA is generally detectable in upper and lower respiratory specimens during the acute phase of infection. The  lowest concentration of SARS-CoV-2 viral copies this assay can detect is 250 copies / mL. A negative result does not preclude SARS-CoV-2 infection and should not be used as the sole basis for treatment or other patient management decisions.  A negative result may occur with improper specimen collection / handling, submission of specimen other than nasopharyngeal swab, presence of viral mutation(s) within the areas targeted by this assay, and inadequate number of viral copies (<250 copies / mL). A negative result must be combined with clinical  observations, patient history, and epidemiological information.  Fact Sheet for Patients:   StrictlyIdeas.no  Fact Sheet for Healthcare Providers: BankingDealers.co.za  This test is not yet approved or  cleared by the [place] FDA and has been authorized for detection and/or diagnosis of SARS-CoV-2 by FDA under an Emergency Use Authorization (EUA).  This EUA will remain in effect (meaning this test can be used) for the duration of the COVID-19 declaration under Section 564(b)(1) of the Act, 21 U.S.C. section 360bbb-3(b)(1), unless the authorization is terminated or revoked sooner.  Performed at [facility] [place] [address] [place], [place] [address]       Imaging Studies   No results found.   Medications   Scheduled Meds: . acetaminophen  1,000 mg Oral Q8H  . aspirin EC  81 mg Oral Daily  . carvedilol  25 mg Oral BID WC  . dexamethasone  4 mg Oral Q12H  . enoxaparin (LOVENOX) injection  40 mg Subcutaneous Q24H  . hydrochlorothiazide  25 mg Oral Daily  . insulin aspart  0-9 Units Subcutaneous TID WC  . levothyroxine  125 mcg Oral Q0600  . losartan  50 mg Oral Daily  . methocarbamol  1,000 mg Oral QID  . morphine  60 mg Oral Q12H  . omega-3 acid ethyl esters  1 g Oral Daily  . polyethylene glycol  17 g Oral BID  . pravastatin  40 mg Oral q1800  . senna  2  tablet Oral BID   Continuous Infusions: . sodium chloride 10 mL/hr (02/26/20 0933)       LOS: 3 days    Samantha Lozano, [facility]  02/29/2020, 1:14 PM    If 7PM-7AM, please contact night-coverage. How to contact the [facility] Attending or Consulting provider [place] or covering provider during after hours [place], for this patient?    1. Check the care team in [facility] and look for a) attending/consulting [facility] provider listed and b) the [facility] team listed 2. Log into [email or web address] and use [place]'s universal password to access. If you do not have the password, please contact the hospital operator. 3. Locate the [facility] provider you are looking for under [facility] and page to a number that you can be directly reached. 4. If you still have difficulty reaching the provider, please page the [facility] (Director on Call) for the Hospitalists listed on amion for assistance.

## 2020-02-29 NOTE — Telephone Encounter (Signed)
Scheduled appts per 6/18 los. Left voicemail with appt date and time.

## 2020-02-29 NOTE — Progress Notes (Signed)
PT Cancellation Note  Patient Details Name: Samantha Lozano MRN: [id number] DOB: 1946-04-01   Cancelled Treatment:    Reason Eval/Treat Not Completed: Pain limiting ability to participate    Doreatha Massed, PT Acute Rehabilitation  Office: [phone] Pager: [phone]

## 2020-02-29 NOTE — Progress Notes (Signed)
Samantha Lozano   DOB:1946-07-03   DP#:[id number]   WER#:[id number]  Subjective:  Pain is better today.  Palliative care has increased the dose of MS Contin.  Radiation oncology consult currently pending.  Still having ongoing issues with constipation.  Palliative care is assisting with bowel regimen.  Objective: White woman examined in bed Vitals:   02/29/20 0629 02/29/20 1349  BP: 132/77 (!) 154/75  Pulse: 60 60  Resp: 18 18  Temp: 98.3 F (36.8 C) 97.9 F (36.6 C)  SpO2: 96% 93%    Body mass index is 41.09 kg/m.  Intake/Output Summary (Last 24 hours) at 02/29/2020 1432 Last data filed at 02/29/2020 1214 Gross per 24 hour  Intake 720 ml  Output --  Net 720 ml     Sclerae unicteric  Lungs no rales or wheezes--auscultated anterolaterally  Heart regular rate and rhythm  Abdomen soft, +BS  Neuro nonfocal  MSK: Tumor nodule in the right lower extremity area, not erythematous, nontender, measuring approximately 2-1/2 cm  Breast exam: Deferred  CBG (last 3)  Recent Labs    02/28/20 2103 02/29/20 0731 02/29/20 1128  GLUCAP 259* 189* 155*     Labs:  Lab Results  Component Value Date   WBC 6.1 02/27/2020   HGB 11.1 (L) 02/27/2020   HCT 34.3 (L) 02/27/2020   MCV 86.0 02/27/2020   PLT 161 02/27/2020   NEUTROABS 5.1 02/26/2020    _0 @  Urine Studies No results for input(s): UHGB, CRYS in the last 72 hours.  Invalid input(s): UACOL, UAPR, USPG, UPH, UTP, UGL, UKET, UBIL, UNIT, UROB, ULEU, UEPI, UWBC, URBC, UBAC, CAST, [place], [place]  Basic Metabolic Panel: Recent Labs  Lab 02/26/20 0938 02/26/20 0938 02/28/20 0717 02/29/20 0539  NA 139  --  135 134*  K 3.5   < > 3.2* 4.1  CL 102  --  103 99  CO2 24  --  24 24  GLUCOSE 125*  --  107* 193*  BUN 14  --  18 26*  CREATININE 0.65  --  0.74 0.83  CALCIUM 9.7  --  9.0 9.3  MG  --   --  1.8 2.0   < > = values in this interval not displayed.   GFR Estimated Creatinine Clearance: 68.5 mL/min (by C-G  formula based on SCr of 0.83 mg/dL). Liver Function Tests: Recent Labs  Lab 02/26/20 0938  AST 22  ALT 20  ALKPHOS 88  BILITOT 1.0  PROT 8.8*  ALBUMIN 4.9   No results for input(s): LIPASE, AMYLASE in the last 168 hours. No results for input(s): AMMONIA in the last 168 hours. Coagulation profile No results for input(s): INR, PROTIME in the last 168 hours.  CBC: Recent Labs  Lab 02/26/20 0938 02/27/20 0643  WBC 8.1 6.1  NEUTROABS 5.1  --   HGB 13.6 11.1*  HCT 41.8 34.3*  MCV 85.5 86.0  PLT 138* 161   Cardiac Enzymes: Recent Labs  Lab 02/26/20 0938  CKTOTAL 133   BNP: Invalid input(s): POCBNP CBG: Recent Labs  Lab 02/28/20 1139 02/28/20 1702 02/28/20 2103 02/29/20 0731 02/29/20 1128  GLUCAP 128* 195* 259* 189* 155*   D-Dimer No results for input(s): DDIMER in the last 72 hours. Hgb A1c Recent Labs    02/27/20 0643  HGBA1C 6.7*   Lipid Profile No results for input(s): CHOL, HDL, LDLCALC, TRIG, CHOLHDL, LDLDIRECT in the last 72 hours. Thyroid function studies No results for input(s): TSH, T4TOTAL, T3FREE, THYROIDAB in the last 72  hours.  Invalid input(s): FREET3 Anemia work up No results for input(s): VITAMINB12, FOLATE, FERRITIN, TIBC, IRON, RETICCTPCT in the last 72 hours. Microbiology Recent Results (from the past 240 hour(s))  SARS Coronavirus 2 by RT PCR (hospital order, performed in [facility] hospital lab) Nasopharyngeal Nasopharyngeal Swab     Status: None   Collection Time: 02/26/20  4:10 PM   Specimen: Nasopharyngeal Swab  Result Value Ref Range Status   SARS Coronavirus 2 NEGATIVE NEGATIVE Final    Comment: (NOTE) SARS-CoV-2 target nucleic acids are NOT DETECTED.  The SARS-CoV-2 RNA is generally detectable in upper and lower respiratory specimens during the acute phase of infection. The lowest concentration of SARS-CoV-2 viral copies this assay can detect is 250 copies / mL. A negative result does not preclude SARS-CoV-2  infection and should not be used as the sole basis for treatment or other patient management decisions.  A negative result may occur with improper specimen collection / handling, submission of specimen other than nasopharyngeal swab, presence of viral mutation(s) within the areas targeted by this assay, and inadequate number of viral copies (<250 copies / mL). A negative result must be combined with clinical observations, patient history, and epidemiological information.  Fact Sheet for Patients:   StrictlyIdeas.no  Fact Sheet for Healthcare Providers: BankingDealers.co.za  This test is not yet approved or  cleared by the [place] FDA and has been authorized for detection and/or diagnosis of SARS-CoV-2 by FDA under an Emergency Use Authorization (EUA).  This EUA will remain in effect (meaning this test can be used) for the duration of the COVID-19 declaration under Section 564(b)(1) of the Act, 21 U.S.C. section 360bbb-3(b)(1), unless the authorization is terminated or revoked sooner.  Performed at [facility], [place] [address] [place], [place] [address]       Studies:  No results found.  Assessment: 73 y.o. [facility] woman presenting 01/10/2020 with pleuritic chest pain, chest CT scan same day showing innumerable bilateral pulmonary nodules, largest in the superior left lower lobe measuring 2.5 cm largest on the right side in the lower lobe at 1.9 cm.  Scan also showed lytic lesions at T12, multiple ribs, and right humeral head             (a) CEA, CA 19-9, CA 27-29 all within normal limits 01/13/2020             (b) bone marrow biopsy 01/15/2020 nondiagnostic             (c) right breast mammography and ultrasonography led to biopsy [id number], all benign             (d) head CT with and without contrast 01/21/2020 no evidence of brain metastases             (e) PET scan 02/05/2020 shows multiple lung  lesions, a soft tissue mass in the lower left back adjacent to the 12th rib, no abnormal liver activity, no hypermetabolic activity in the colon, pancreas, spleen, or adrenals, expansile lytic lesion in the medial right iliac bone measuring 3.5 cm with SUV of 4.0 and a smaller lytic lesion in the right iliac bone adjacent to the acetabulum measuring 1.5 cm with an SUV max of 4.             (f) CT biopsy obtained 02/17/2020 from the paraspinal mass shows ([id number]) spindle cell malignancy consistent with leiomyosarcoma, low-grade (0 necrosis, 1 differentiation, 2 mitotic rate = 3)  (1) consult  placed to radiation oncology for palliative radiation for pain control  (2) appointment requested at the [facility] sarcoma clinic for second opinion regarding further management   Plan:  Jesscia's pain has improved.  MS Contin has been increased and she has oxycodone for breakthrough pain.  Appreciate assistance from the palliative care team.  She is scheduled for a radiation oncology consult later today for palliative radiation to the right hip area.    The patient's biggest issue today is constipation.  This is likely opioid induced.  She is on several medications already for constipation.  I note that the hospitalist has ordered a fleets enema.  The patient's records have been faxed down to Dr. Acey Lav at [facility] for a second opinion related to her metastatic leiomyosarcoma.  We are requesting an appointment for the patient to be seen within the next 2 weeks.  We will continue to follow with you   Mikey Bussing, NP 02/29/2020  2:32 PM

## 2020-03-01 ENCOUNTER — Ambulatory Visit
Admit: 2020-03-01 | Discharge: 2020-03-01 | Disposition: A | Discharge status: Home / Self Care | Payer: PPO | Attending: Radiation Oncology | Admitting: Radiation Oncology

## 2020-03-01 DIAGNOSIS — Z515 Encounter for palliative care: Secondary | ICD-10-CM

## 2020-03-01 DIAGNOSIS — R52 Pain, unspecified: Secondary | ICD-10-CM

## 2020-03-01 DIAGNOSIS — Z7189 Other specified counseling: Secondary | ICD-10-CM

## 2020-03-01 LAB — GLUCOSE, CAPILLARY
Glucose-Capillary: 153 mg/dL — ABNORMAL HIGH (ref 70–99)
Glucose-Capillary: 186 mg/dL — ABNORMAL HIGH (ref 70–99)
Glucose-Capillary: 168 mg/dL — ABNORMAL HIGH (ref 70–99)
Glucose-Capillary: 235 mg/dL — ABNORMAL HIGH (ref 70–99)

## 2020-03-01 MED ORDER — ALUM & MAG HYDROXIDE-SIMETH 200-200-20 MG/5ML PO SUSP
30.0000 mL | Freq: Once | ORAL | Status: AC
  Administered 2020-03-01: 30 mL via ORAL
  Filled 2020-03-01: qty 30

## 2020-03-01 MED ORDER — ALUM & MAG HYDROXIDE-SIMETH 200-200-20 MG/5ML PO SUSP: 30.0000 mL | Freq: Four times a day (QID) | ORAL | Status: DC | PRN

## 2020-03-01 NOTE — Progress Notes (Addendum)
The patient was consented to treat three separate isocenters: the T12 spinous process and paraspinous mass as one, the right iliac region as a second, and the right proximal femur as a third site. We will begin XRT tomorrow and treat over 3 weeks. I also called her daughter to communicate the plan. We will follow up with her ankle as well to see if she's still having issues.       Carola Rhine, PAC

## 2020-03-01 NOTE — Progress Notes (Signed)
PROGRESS NOTE    Samantha Lozano   HKV:[id number]  DOB: 1946-01-26  PCP: Orpah Melter, MD    DOA: 02/26/2020 LOS: 4   Brief Narrative   Samantha Lozano is a 74 y.o. female with medical history significant of widespread metastatic disease with very recent tissue diagnosis of leiomyosarcoma grade 1, hypertension, hyperlipidemia, type 2 diabetes, obesity, osteoarthritis who presented to the ED on 02/26/20 for progressive right hip pain in addition to back, right knee, and right ankle pain.  MRI of the hip showed progressive widespread metastatic disease throughout the bony pelvis and proximal femur, with large lytic lesion on the right iliac crest (see report for other findings).  Admitted for management of intractable pain.  Oncology is following.   Assessment & Plan   Intractable pain due to bony metastatic disease Most pain currently in right hip, ankle, knee, back.    Palliative care for was consulted for pain management.  They have adjusted her pain medications.  MS Contin dose was increased.  She also remains on OxyIR.  IV Dilaudid as needed for breakthrough pain.  Bowel regimen.  She finally had a large bowel movement yesterday after she was given the enema.  PT evaluation.  She is supposed to get simulation today for radiation treatments.  Treatment is anticipated to start tomorrow.   Leiomyosarcoma  Followed by oncologist Dr. Jana Hakim.  Considering palliative radiation for bony mets and pain relief.  Planning for 2nd opinion at [facility].  Hypokalemia Potassium level corrected.  Magnesium 2.0.    Diabetes mellitus type 2 Metformin on hold.  SSI.  CBGs reasonably well controlled.  6.7.  Essential hypertension Occasional high readings noted.  Continue carvedilol and HCTZ.  No longer taking losartan.    Hypothyroidism Continue Synthroid  Morbid Obesity Body mass index is 41.09 kg/m.  Complicates overall care and prognosis.  DVT prophylaxis: enoxaparin (LOVENOX) injection  40 mg Start: 02/26/20 2200   Diet:  Diet Orders (From admission, onward)    Start     Ordered   02/26/20 1825  Diet Carb Modified Fluid consistency: Thin; Room service appropriate? Yes  Diet effective now       Question Answer Comment  Diet-HS Snack? Nothing   Calorie Level Medium 1600-2000   Fluid consistency: Thin   Room service appropriate? Yes      02/26/20 1824            Code Status: Full Code    Subjective 03/01/20    Patient mentions that pain is better controlled.  She had a bowel movement last night.  Overall she feels better.    Disposition Plan & Communication   Status is: Inpatient  Remains inpatient appropriate because:Ongoing active pain requiring inpatient pain management   Dispo: The patient is from: Home              Anticipated d/c is to: Home              Anticipated d/c date is: 2 days              Patient currently is not medically stable to d/c.  Anticipate discharge tomorrow after first radiation treatment.  Family Communication: Discussed in detail with the patient.   Consults, Procedures, Significant Events   Consultants:   Oncology  Palliative Care  Procedures:   None  Antimicrobials:   None    Objective   Vitals:   02/29/20 1349 02/29/20 2045 02/29/20 2250 03/01/20 0539  BP: (!) 154/75  140/71 138/70 (!) 150/60  Pulse: 60 62 63 (!) 57  Resp: 18 18 18 17   Temp: 97.9 F (36.6 C) 97.8 F (36.6 C) 98 F (36.7 C) 98.1 F (36.7 C)  TempSrc: Oral Oral Oral Oral  SpO2: 93% 94% 93% 96%  Weight:      Height:       No intake or output data in the 24 hours ending 03/01/20 1220 Filed Weights   02/26/20 0732 02/26/20 2019 02/28/20 0638  Weight: 99.8 kg 102.3 kg 101.9 kg    Physical Exam:  General appearance: Awake alert.  In no distress Resp: Clear to auscultation bilaterally.  Normal effort Cardio: S1-S2 is normal regular.  No S3-S4.  No rubs murmurs or bruit GI: Abdomen is soft.  Nontender nondistended.  Bowel  sounds are present normal.  No masses organomegaly Extremities: Restricted range of motion due to pain issues.  However her mobility has improved. Neurologic: Alert and oriented x3.  No focal neurological deficits.      Labs   Data Reviewed: I have personally reviewed following labs and imaging studies  CBC: Recent Labs  Lab 02/26/20 0938 02/27/20 0643  WBC 8.1 6.1  NEUTROABS 5.1  --   HGB 13.6 11.1*  HCT 41.8 34.3*  MCV 85.5 86.0  PLT 138* 865   Basic Metabolic Panel: Recent Labs  Lab 02/26/20 0938 02/28/20 0717 02/29/20 0539  NA 139 135 134*  K 3.5 3.2* 4.1  CL 102 103 99  CO2 24 24 24   GLUCOSE 125* 107* 193*  BUN 14 18 26*  CREATININE 0.65 0.74 0.83  CALCIUM 9.7 9.0 9.3  MG  --  1.8 2.0   GFR: Estimated Creatinine Clearance: 68.5 mL/min (by C-G formula based on SCr of 0.83 mg/dL). Liver Function Tests: Recent Labs  Lab 02/26/20 0938  AST 22  ALT 20  ALKPHOS 88  BILITOT 1.0  PROT 8.8*  ALBUMIN 4.9   Cardiac Enzymes: Recent Labs  Lab 02/26/20 0938  CKTOTAL 133   HbA1C: No results for input(s): HGBA1C in the last 72 hours. CBG: Recent Labs  Lab 02/29/20 1128 02/29/20 1631 02/29/20 2136 03/01/20 0738 03/01/20 1150  GLUCAP 155* 173* 181* 153* 186*     Recent Results (from the past 240 hour(s))  SARS Coronavirus 2 by RT PCR (hospital order, performed in [facility] hospital lab) Nasopharyngeal Nasopharyngeal Swab     Status: None   Collection Time: 02/26/20  4:10 PM   Specimen: Nasopharyngeal Swab  Result Value Ref Range Status   SARS Coronavirus 2 NEGATIVE NEGATIVE Final    Comment: (NOTE) SARS-CoV-2 target nucleic acids are NOT DETECTED.  The SARS-CoV-2 RNA is generally detectable in upper and lower respiratory specimens during the acute phase of infection. The lowest concentration of SARS-CoV-2 viral copies this assay can detect is 250 copies / mL. A negative result does not preclude SARS-CoV-2 infection and should not be used as the  sole basis for treatment or other patient management decisions.  A negative result may occur with improper specimen collection / handling, submission of specimen other than nasopharyngeal swab, presence of viral mutation(s) within the areas targeted by this assay, and inadequate number of viral copies (<250 copies / mL). A negative result must be combined with clinical observations, patient history, and epidemiological information.  Fact Sheet for Patients:   StrictlyIdeas.no  Fact Sheet for Healthcare Providers: BankingDealers.co.za  This test is not yet approved or  cleared by the [place] FDA and has been  authorized for detection and/or diagnosis of SARS-CoV-2 by FDA under an Emergency Use Authorization (EUA).  This EUA will remain in effect (meaning this test can be used) for the duration of the COVID-19 declaration under Section 564(b)(1) of the Act, 21 U.S.C. section 360bbb-3(b)(1), unless the authorization is terminated or revoked sooner.  Performed at [facility], [place] [address] [place], [place] [address]       Imaging Studies   No results found.   Medications   Scheduled Meds: . acetaminophen  1,000 mg Oral Q8H  . aspirin EC  81 mg Oral Daily  . carvedilol  25 mg Oral BID WC  . dexamethasone  4 mg Oral Q12H  . enoxaparin (LOVENOX) injection  40 mg Subcutaneous Q24H  . hydrochlorothiazide  25 mg Oral Daily  . insulin aspart  0-9 Units Subcutaneous TID WC  . levothyroxine  125 mcg Oral Q0600  . losartan  50 mg Oral Daily  . methocarbamol  1,000 mg Oral QID  . morphine  60 mg Oral Q12H  . omega-3 acid ethyl esters  1 g Oral Daily  . polyethylene glycol  17 g Oral BID  . pravastatin  40 mg Oral q1800  . senna  2 tablet Oral BID   Continuous Infusions: . sodium chloride 10 mL/hr (02/26/20 0933)       LOS: 4 days    Bonnielee Haff, [facility]  03/01/2020, 12:20 PM    If  7PM-7AM, please contact night-coverage. How to contact the [facility] Attending or Consulting provider [place] or covering provider during after hours [place], for this patient?    1. Check the care team in [facility] and look for a) attending/consulting TRH provider listed and b) the [facility] team listed 2. Log into [email or web address] and use [place]'s universal password to access. If you do not have the password, please contact the hospital operator. 3. Locate the [facility] provider you are looking for under [facility] and page to a number that you can be directly reached. 4. If you still have difficulty reaching the provider, please page the [facility] (Director on Call) for the Hospitalists listed on amion for assistance.

## 2020-03-01 NOTE — Progress Notes (Signed)
OT Cancellation Note  Patient Details Name: Samantha Lozano MRN: [id number] DOB: 17-May-1946   Cancelled Treatment:    Reason Eval/Treat Not Completed: OT screened, no needs identified, will sign off. Upon arrival patient ambulating in the hall with her son. Patient/son report no further acute OT needs at this time. Son states someone will be with patient 24/7 at home and he plans to get a wheelchair from [place], have all other DME needs. Will discontinue acute OT. Please re-consult if new needs arise.   Delbert Phenix OT Pager: [place] 03/01/2020, 7:51 AM

## 2020-03-01 NOTE — Progress Notes (Signed)
Daily Progress Note   Patient Name: Samantha Lozano       Date: 03/01/2020 DOB: May 31, 1946  Age: 74 y.o. MRN#: [id number] Attending Physician: Bonnielee Haff, MD Primary Care Physician: Orpah Melter, MD Admit Date: 02/26/2020  Reason for Consultation/Follow-up: Establishing goals of care, Non pain symptom management and Pain control  Subjective: [name] has just returned from [place] appointment, she is resting in bed, son [name] is at bedside, we discussed about current options for pain and non pain symptom management, she did have a bowel movement on 6-21 after her bowel regimen was adjusted.      Length of Stay: 4  Current Medications: Scheduled Meds:  . acetaminophen  1,000 mg Oral Q8H  . aspirin EC  81 mg Oral Daily  . carvedilol  25 mg Oral BID WC  . dexamethasone  4 mg Oral Q12H  . enoxaparin (LOVENOX) injection  40 mg Subcutaneous Q24H  . hydrochlorothiazide  25 mg Oral Daily  . insulin aspart  0-9 Units Subcutaneous TID WC  . levothyroxine  125 mcg Oral Q0600  . losartan  50 mg Oral Daily  . methocarbamol  1,000 mg Oral QID  . morphine  60 mg Oral Q12H  . omega-3 acid ethyl esters  1 g Oral Daily  . polyethylene glycol  17 g Oral BID  . pravastatin  40 mg Oral q1800  . senna  2 tablet Oral BID    Continuous Infusions: . sodium chloride 10 mL/hr (02/26/20 0933)    PRN Meds: alum & mag hydroxide-simeth, bisacodyl, hydrALAZINE, HYDROmorphone (DILAUDID) injection, magnesium citrate, ondansetron **OR** ondansetron (ZOFRAN) IV, oxyCODONE, traZODone  Physical Exam         General: Alert, awake, in no acute distress.  Heart: Regular rate and rhythm. No murmur appreciated. Lungs: Good air movement, clear Abdomen: Soft, nontender, nondistended, positive bowel  sounds.  Ext: No significant edema Skin: Warm and dry Neuro: Grossly intact, nonfocal.  Vital Signs: BP (!) 150/60 (BP Location: Left Arm)   Pulse (!) 57   Temp 98.1 F (36.7 C) (Oral)   Resp 17   Ht 5\' 2"  (1.575 m)   Wt 101.9 kg   SpO2 96%   BMI 41.09 kg/m  SpO2: SpO2: 96 % O2 Device: O2 Device: Room Air O2 Flow Rate:  Intake/output summary:  No intake or output data in the 24 hours ending 03/01/20 1419 LBM: Last BM Date: 02/29/20 Baseline Weight: Weight: 99.8 kg Most recent weight: Weight: 101.9 kg       Palliative Assessment/Data:      Patient Active Problem List   Diagnosis Date Noted  . Bony metastasis ([place]) 02/26/2020  . Intractable pain 02/26/2020  . Leiomyosarcoma ([place]) 02/26/2020  . Hypothyroidism 02/26/2020  . Bone metastases ([address] [address]) 01/22/2020  . Lung metastases ([place]) 01/13/2020  . Osteoarthritis of both knees 12/14/2019  . Pain in both knees 11/26/2019  . Essential hypertension 09/16/2019  . Depression 07/06/2019  . Class 2 severe obesity with serious comorbidity and body mass index (BMI) of 36.0 to 36.9 in adult [facility]) 07/30/2018  . Other fatigue 03/10/2018  . Shortness of breath on exertion 03/10/2018  . Type 2 diabetes mellitus ([place]) 03/10/2018  . Abnormal EKG 03/10/2018  . Vitamin D deficiency 03/10/2018  . B12 nutritional deficiency 03/10/2018    Palliative Care Assessment & Plan   Patient Profile: 74 year old female with newly diagnosed leiomyosarcoma with bony mets admitted for uncontrolled cancer related pain.  Assessment: Patient Active Problem List   Diagnosis Date Noted  . Bony metastasis ([place]) 02/26/2020  . Intractable pain 02/26/2020  . Leiomyosarcoma ([place]) 02/26/2020  . Hypothyroidism 02/26/2020  . Bone metastases ([place]) 01/22/2020  . Lung metastases ([place]) 01/13/2020  . Osteoarthritis of both knees 12/14/2019  . Pain in both knees 11/26/2019  . Essential hypertension 09/16/2019  . Depression 07/06/2019  . Class 2 severe  obesity with serious comorbidity and body mass index (BMI) of 36.0 to 36.9 in adult [facility]) 07/30/2018  . Other fatigue 03/10/2018  . Shortness of breath on exertion 03/10/2018  . Type 2 diabetes mellitus ([place]) 03/10/2018  . Abnormal EKG 03/10/2018  . Vitamin D deficiency 03/10/2018  . B12 nutritional deficiency 03/10/2018    Recommendations/Plan:   Cancer-related pain: MAR checked, continue with current opioids and adjuvants. On scheduled Tylenol, on MS Contin 60 mg Q 12 hours scheduled. PRN usage of Oxy IR 20 mg PO available 3 hours as needed. To start radiation in am. We will continue to follow and adjust medications based upon her clinical course.  Constipation, opioid related: she had a bowel movement on 6-21, continue current bowel regimen for now.    Goals of care: Await input from [place] regarding potential disease modifying therapy options. Offered active listening and supportive care.   Goals of Care and Additional Recommendations:  Limitations on Scope of Treatment: Full Scope Treatment  Code Status:    Code Status Orders  (From admission, onward)         Start     Ordered   02/26/20 1815  Full code  Continuous        02/26/20 1820        Code Status History    This patient has a current code status but no historical code status.   Advance Care Planning Activity    Advance Directive Documentation     Most Recent Value  Type of Advance Directive Healthcare Power of Attorney, Living will  Pre-existing out of facility DNR order (yellow form or pink MOST form) --  "MOST" Form in Place? --       Prognosis:   Unable to determine  Discharge Planning:  Home with Home Health most likely  Care plan was discussed with patient, family  Thank you for allowing the Palliative Medicine Team to assist in the  care of this patient.   Total Time 25 Prolonged Time Billed No   Greater than 50%  of this time was spent counseling and coordinating care related to the  above assessment and plan.  Loistine Chance, MD  Please contact Palliative Medicine Team phone at [phone] for questions and concerns.

## 2020-03-01 NOTE — Progress Notes (Signed)
Daily Progress Note   Patient Name: Samantha Lozano       Date: 03/01/2020 DOB: [birth year]  Age: 74 y.o. MRN#: [id number] Attending Physician: Bonnielee Haff, MD Primary Care Physician: Orpah Melter, MD Admit Date: 02/26/2020  Reason for Consultation/Follow-up: Establishing goals of care, Non pain symptom management and Pain control  Subjective: I saw and examined Ms. Kosak today.  Dr. Jana Hakim also arrived to her room partway through my visit and joined [name] for the end of my encounter.  Her daughter-in-law, [name], and her son [name] with the bedside as well.  We discussed her pain regimen and her pain management overnight.  She reports pain is a little bit worse than it was yesterday overall.  She reports getting relief from oral oxycodone, but states that it does not last very long.  She continues to use oral oxycodone and intermittent IV Dilaudid in addition to MS Contin that was started yesterday.  She also reports that it has been since Thursday since she had a bowel movement.  I did increase her regimen to Senokot 2 tabs twice daily, but discussed with her the need for a bowel movement and plan to increase MiraLAX as well.  If she does not have bowel movement by tomorrow, discussed possible need for enema/suppository.  Length of Stay: 4  Current Medications: Scheduled Meds:  . acetaminophen  1,000 mg Oral Q8H  . aspirin EC  81 mg Oral Daily  . carvedilol  25 mg Oral BID WC  . dexamethasone  4 mg Oral Q12H  . enoxaparin (LOVENOX) injection  40 mg Subcutaneous Q24H  . hydrochlorothiazide  25 mg Oral Daily  . insulin aspart  0-9 Units Subcutaneous TID WC  . levothyroxine  125 mcg Oral Q0600  . losartan  50 mg Oral Daily  . methocarbamol  1,000 mg Oral QID  . morphine  60 mg  Oral Q12H  . omega-3 acid ethyl esters  1 g Oral Daily  . polyethylene glycol  17 g Oral BID  . pravastatin  40 mg Oral q1800  . senna  2 tablet Oral BID    Continuous Infusions: . sodium chloride 10 mL/hr (02/26/20 0933)    PRN Meds: bisacodyl, hydrALAZINE, HYDROmorphone (DILAUDID) injection, magnesium citrate, ondansetron **OR** ondansetron (ZOFRAN) IV, oxyCODONE, traZODone  Physical Exam  General: Alert, awake, in no acute distress.  Heart: Regular rate and rhythm. No murmur appreciated. Lungs: Good air movement, clear Abdomen: Soft, nontender, nondistended, positive bowel sounds.  Ext: No significant edema Skin: Warm and dry Neuro: Grossly intact, nonfocal.  Vital Signs: BP 138/70 (BP Location: Left Arm)   Pulse 63   Temp 98 F (36.7 C) (Oral)   Resp 18   Ht 5\' 2"  (1.575 m)   Wt 101.9 kg   SpO2 93%   BMI 41.09 kg/m  SpO2: SpO2: 93 % O2 Device: O2 Device: Room Air O2 Flow Rate:    Intake/output summary:   Intake/Output Summary (Last 24 hours) at 03/01/2020 0014 Last data filed at 02/29/2020 1214 Gross per 24 hour  Intake 480 ml  Output --  Net 480 ml   LBM: Last BM Date: 02/25/20 Baseline Weight: Weight: 99.8 kg Most recent weight: Weight: 101.9 kg       Palliative Assessment/Data:      Patient Active Problem List   Diagnosis Date Noted  . Bony metastasis ([place]) 02/26/2020  . Intractable pain 02/26/2020  . Leiomyosarcoma ([place]) 02/26/2020  . Hypothyroidism 02/26/2020  . Bone metastases ([place]) 01/22/2020  . Lung metastases ([place]) 01/13/2020  . Osteoarthritis of both knees 12/14/2019  . Pain in both knees 11/26/2019  . Essential hypertension 09/16/2019  . Depression 07/06/2019  . Class 2 severe obesity with serious comorbidity and body mass index (BMI) of 36.0 to 36.9 in adult [facility]) 07/30/2018  . Other fatigue 03/10/2018  . Shortness of breath on exertion 03/10/2018  . Type 2 diabetes mellitus ([place]) 03/10/2018  . Abnormal EKG 03/10/2018  .  Vitamin D deficiency 03/10/2018  . B12 nutritional deficiency 03/10/2018    Palliative Care Assessment & Plan   Patient Profile: 74 year old female with newly diagnosed leiomyosarcoma with bony mets admitted for uncontrolled cancer related pain.  Assessment: Patient Active Problem List   Diagnosis Date Noted  . Bony metastasis ([place]) 02/26/2020  . Intractable pain 02/26/2020  . Leiomyosarcoma ([place]) 02/26/2020  . Hypothyroidism 02/26/2020  . Bone metastases ([place]) 01/22/2020  . Lung metastases ([place]) 01/13/2020  . Osteoarthritis of both knees 12/14/2019  . Pain in both knees 11/26/2019  . Essential hypertension 09/16/2019  . Depression 07/06/2019  . Class 2 severe obesity with serious comorbidity and body mass index (BMI) of 36.0 to 36.9 in adult [facility]) 07/30/2018  . Other fatigue 03/10/2018  . Shortness of breath on exertion 03/10/2018  . Type 2 diabetes mellitus ([place]) 03/10/2018  . Abnormal EKG 03/10/2018  . Vitamin D deficiency 03/10/2018  . B12 nutritional deficiency 03/10/2018    Recommendations/Plan:   Cancer-related pain: MAR reveals that she has had Dilaudid 1 mg x 1 dose (oral morphine equivalent of 20 mg), MS Contin 60 mg x 2 doses (oral morphine equivalent of 120 mg), and oxycodone 15 mg x 7 doses (oral morphine equivalent of 157.5 mg).  Her overall 24-hour oral morphine equivalent is therefore 297.5 mg.  Consideration for increasing her long-acting further, but with the fact that I think she will respond well to radiation therapy, I discussed with her plan to focus at this point on utilization of more short acting medication that I would if I did not think her pain was likely to improve in the semi near future.  We will therefore increase rescue oxycodone to 20 mg every 3 hours as needed (as this rescue dose is approximately 10% of total oral morphine equivalent of roughly 300 mg of oral morphine).  If pain remains uncontrolled, can certainly continue to titrate her opioids.   My hope is that she will be able to down titrate some opioids once radiation is started.  Continue adjunct treatments of Robaxin, scheduled Tylenol, and steroids.  Dr. Jana Hakim is planning to reach out to radiation oncology to discuss her case tomorrow.  I do think she has high likelihood of being benefited by radiation for her bony lesions.  We will continue to follow and adjust medications based upon her clinical course.  Constipation, opioid related: Senna 2 tabs twice daily.  MiraLAX twice daily schedule until she has a bowel movement.  Plan for enema thi afternoon.  Goals of care: Await input from [place] regarding potential disease modifying therapy options.  I did drop off a copy of hard choices for loving people for her and her family to review.  Goals of Care and Additional Recommendations:  Limitations on Scope of Treatment: Full Scope Treatment  Code Status:    Code Status Orders  (From admission, onward)         Start     Ordered   02/26/20 1815  Full code  Continuous        02/26/20 1820        Code Status History    This patient has a current code status but no historical code status.   Advance Care Planning Activity    Advance Directive Documentation     Most Recent Value  Type of Advance Directive Healthcare Power of Attorney, Living will  Pre-existing out of facility DNR order (yellow form or pink MOST form) --  "MOST" Form in Place? --       Prognosis:   Unable to determine  Discharge Planning:  Home with Home Health most likely  Care plan was discussed with patient, family  Thank you for allowing the Palliative Medicine Team to assist in the care of this patient.   Total Time 40 Prolonged Time Billed No   Greater than 50%  of this time was spent counseling and coordinating care related to the above assessment and plan.  Micheline Rough, MD  Please contact Palliative Medicine Team phone at [phone] for questions and concerns.

## 2020-03-02 ENCOUNTER — Ambulatory Visit
Admit: 2020-03-02 | Discharge: 2020-03-02 | Disposition: A | Discharge status: Home / Self Care | Payer: PPO | Attending: Radiation Oncology | Admitting: Radiation Oncology

## 2020-03-02 LAB — GLUCOSE, CAPILLARY
Glucose-Capillary: 139 mg/dL — ABNORMAL HIGH (ref 70–99)
Glucose-Capillary: 188 mg/dL — ABNORMAL HIGH (ref 70–99)

## 2020-03-02 MED ORDER — MORPHINE SULFATE ER 60 MG PO TBCR: 60.0000 mg | EXTENDED_RELEASE_TABLET | Freq: Two times a day (BID) | ORAL | 0 refills | Status: DC

## 2020-03-02 MED ORDER — OXYCODONE HCL 20 MG PO TABS: 20.0000 mg | ORAL_TABLET | ORAL | 0 refills | Status: DC | PRN

## 2020-03-02 MED ORDER — POLYETHYLENE GLYCOL 3350 17 G PO PACK: 17.0000 g | PACK | Freq: Two times a day (BID) | ORAL | 1 refills | Status: AC

## 2020-03-02 MED ORDER — SENNA 8.6 MG PO TABS: 2.0000 | ORAL_TABLET | Freq: Two times a day (BID) | ORAL | 1 refills | Status: AC

## 2020-03-02 MED ORDER — METHOCARBAMOL 500 MG PO TABS: 1000.0000 mg | ORAL_TABLET | Freq: Four times a day (QID) | ORAL | 0 refills | Status: AC

## 2020-03-02 MED ORDER — BISACODYL 10 MG RE SUPP: 10.0000 mg | Freq: Every day | RECTAL | Status: DC | PRN

## 2020-03-02 MED ORDER — DEXAMETHASONE 2 MG PO TABS: ORAL_TABLET | ORAL | 0 refills | Status: DC

## 2020-03-02 MED ORDER — BISACODYL 10 MG RE SUPP: 10.0000 mg | Freq: Every day | RECTAL | 0 refills | Status: AC | PRN

## 2020-03-02 NOTE — Progress Notes (Signed)
Physical Therapy Treatment Patient Details Name: Samantha Lozano MRN: [id number] DOB: 1946/09/06 Today's Date: 03/02/2020    History of Present Illness 74 yo female admitted with intractable pain. Imaging (+) lytic lesions pelvis and R LE. Hx of met ca, Dm, OA, obesity, leimyosarcoma    PT Comments    Pt demonstrating gradual progress.  She demonstrates safe balance with RW, but does have antalgic gait patterns and requiring frequent standing rest breaks.  Pt very motivated and with good family support - continue to recommend d/c home once discharged.     Follow Up Recommendations  Home health PT;Supervision for mobility/OOB (pt declined HHPT)     Equipment Recommendations  None recommended by PT (reports has RW)    Recommendations for Other Services       Precautions / Restrictions Precautions Precautions: Fall    Mobility  Bed Mobility Overal bed mobility: Modified Independent             General bed mobility comments: use of bed rail   Transfers Overall transfer level: Needs assistance Equipment used: Rolling walker (2 wheeled) Transfers: Sit to/from Stand Sit to Stand: Supervision         General transfer comment: cues for hand placement; sit to stand from bed and toilet.  Pt was able to perform toileting ADLs  Ambulation/Gait Ambulation/Gait assistance: Min guard Gait Distance (Feet): 150 Feet Assistive device: Rolling walker (2 wheeled) Gait Pattern/deviations: Step-to pattern;Decreased weight shift to right;Decreased stride length;Shuffle Gait velocity: decreased   General Gait Details: Decreased weight on R leg due to pain; Multiple standing rest breaks due to pain   Stairs Stairs:  (has ramp at home)           Wheelchair Mobility    Modified Rankin (Stroke Patients Only)       Balance Overall balance assessment: Needs assistance Sitting-balance support: No upper extremity supported;Feet supported Sitting balance-Leahy Scale:  Good     Standing balance support: No upper extremity supported;During functional activity Standing balance-Leahy Scale: Good Standing balance comment: Pt able to perform ADLs and washing hands at sink without UE support                            Cognition Arousal/Alertness: Awake/alert Behavior During Therapy: WFL for tasks assessed/performed Overall Cognitive Status: Within Functional Limits for tasks assessed                                 General Comments: Very pleasant; daughter in law present      Exercises      General Comments General comments (skin integrity, edema, etc.):  VSS.   Pt reports she ambulated in hall early by herself.    Pt and family educated on setting walker height, having supervision/assist as needed (encouraged pt to ask for and accept help as needed - she wants to be independent), recommended frequent mobility (every 1-2 hours even if only short walk) and ankle pumps      Pertinent Vitals/Pain Pain Assessment: 0-10 Pain Score: 7  Pain Location: 7/10 rest; 8/10 walk R hip and ankle Pain Descriptors / Indicators: Aching;Discomfort;Sore Pain Intervention(s): Limited activity within patient's tolerance;Monitored during session    Home Living                      Prior Function  PT Goals (current goals can now be found in the care plan section) Acute Rehab PT Goals Patient Stated Goal: less pain. regain functional mobility PT Goal Formulation: With patient/family Time For Goal Achievement: 03/12/20 Potential to Achieve Goals: Good Progress towards PT goals: Progressing toward goals    Frequency    Min 3X/week      PT Plan Current plan remains appropriate    Co-evaluation              AM-PAC PT "6 Clicks" Mobility   Outcome Measure  Help needed turning from your back to your side while in a flat bed without using bedrails?: None Help needed moving from lying on your back to  sitting on the side of a flat bed without using bedrails?: A Little Help needed moving to and from a bed to a chair (including a wheelchair)?: None Help needed standing up from a chair using your arms (e.g., wheelchair or bedside chair)?: None Help needed to walk in hospital room?: None Help needed climbing 3-5 steps with a railing? : A Little 6 Click Score: 22    End of Session Equipment Utilized During Treatment: Gait belt Activity Tolerance: Patient tolerated treatment well Patient left: in bed;with call bell/phone within reach;with family/visitor present;with bed alarm set Nurse Communication: Mobility status PT Visit Diagnosis: Muscle weakness (generalized) (M62.81);Difficulty in walking, not elsewhere classified (R26.2);Pain Pain - Right/Left: Right Pain - part of body: Leg;Hip;Ankle and joints of foot     Time: 8250-5397 PT Time Calculation (min) (ACUTE ONLY): 28 min  Charges:  $Gait Training: 8-22 mins $Therapeutic Activity: 8-22 mins                     Samantha Lozano, PT Acute Rehab Services Pager [phone] Samantha Lozano [facility] [place] 03/02/2020, 1:16 PM

## 2020-03-02 NOTE — TOC Initial Note (Signed)
Transition of Care [facility]) - Initial/Assessment Note    Patient Details  Name: Samantha Lozano MRN: [id number] Date of Birth: 12/28/1945  Transition of Care [facility] CM/SW Contact:    Alicyn Klann, Marjie Skiff, RN Phone Number: 03/02/2020, 11:29 AM  Clinical Narrative:                 Pt from home alone and plans to stay with daughter-in-law and son at dc. Physical therapy recommendations gone over with both pt and daughter-in-law. Both decline home health services at this time. Pt states she has a RW, BSC and shower chair at home.   Expected Discharge Plan: Home/Self Care Barriers to Discharge: Continued Medical Work up   Patient Goals and CMS Choice     Choice offered to / list presented to : Patient, Adult Children  Expected Discharge Plan and Services Expected Discharge Plan: Home/Self Care   Discharge Planning Services: CM Consult Post Acute Care Choice: [place] arrangements for the past 2 months: Single Family Home                                      Prior Living Arrangements/Services Living arrangements for the past 2 months: Single Family Home Lives with:: Self Patient language and need for interpreter reviewed:: Yes                 Activities of Daily Living Home Assistive Devices/Equipment: None ADL Screening (condition at time of admission) Patient's cognitive ability adequate to safely complete daily activities?: Yes Is the patient deaf or have difficulty hearing?: No Does the patient have difficulty seeing, even when wearing glasses/contacts?: No Does the patient have difficulty concentrating, remembering, or making decisions?: No Patient able to express need for assistance with ADLs?: Yes Does the patient have difficulty dressing or bathing?: No Independently performs ADLs?: Yes (appropriate for developmental age) Does the patient have difficulty walking or climbing stairs?: No Weakness of Legs: Both Weakness of Arms/Hands: None  Permission  Sought/Granted                  Emotional Assessment Appearance:: Appears stated age Attitude/Demeanor/Rapport: Engaged Affect (typically observed): Stable Orientation: : Oriented to Self, Oriented to  Time, Oriented to Place, Oriented to Situation      Admission diagnosis:  Bony metastasis ([place]) [C79.51] Metastatic cancer to pelvis [facility]) [C79.89] Patient Active Problem List   Diagnosis Date Noted  . Palliative care by specialist   . Goals of care, counseling/discussion   . Generalized pain   . Bony metastasis ([place]) 02/26/2020  . Intractable pain 02/26/2020  . Leiomyosarcoma ([place]) 02/26/2020  . Hypothyroidism 02/26/2020  . Bone metastases ([place]) 01/22/2020  . Lung metastases ([place]) 01/13/2020  . Osteoarthritis of both knees 12/14/2019  . Pain in both knees 11/26/2019  . Essential hypertension 09/16/2019  . Depression 07/06/2019  . Class 2 severe obesity with serious comorbidity and body mass index (BMI) of 36.0 to 36.9 in adult [facility]) 07/30/2018  . Other fatigue 03/10/2018  . Shortness of breath on exertion 03/10/2018  . Type 2 diabetes mellitus ([place]) 03/10/2018  . Abnormal EKG 03/10/2018  . Vitamin D deficiency 03/10/2018  . B12 nutritional deficiency 03/10/2018   PCP:  Orpah Melter, MD Pharmacy:   [id number] - [place], [place] [address] Phone: [phone] Fax: [phone]  Social Determinants of Health (SDOH) Interventions    Readmission Risk Interventions No flowsheet data found.

## 2020-03-02 NOTE — Discharge Summary (Signed)
[facility]  Physician Discharge Summary   Patient ID: Samantha Lozano MRN: [id number] DOB/AGE: Nov 07, 1945 74 y.o.  Admit date: 02/26/2020 Discharge date: 03/02/2020  PCP: Orpah Melter, MD  DISCHARGE DIAGNOSES:  Intractable cancer related pain due to bony metastatic disease Leiomyosarcoma Diabetes mellitus type 2, controlled Essential hypertension Hypothyroidism Morbid obesity  RECOMMENDATIONS FOR OUTPATIENT FOLLOW UP: 1. Patient to follow-up with her medical oncologist. 2. Patient to continue with radiation treatments.    Home Health: Patient has declined home health Equipment/Devices: None  CODE STATUS: Full code  DISCHARGE CONDITION: fair  Diet recommendation: Modified carbohydrate  INITIAL HISTORY: Samantha Lozano a 74 y.o.femalewith medical history significant ofwidespread metastatic disease with very recent tissue diagnosis of leiomyosarcoma grade 1, hypertension, hyperlipidemia, type 2 diabetes, obesity, osteoarthritis who presented to the ED on 02/26/20 for progressive right hip pain in addition to back, right knee, and right ankle pain. MRI of the hip showed progressive widespread metastatic disease throughout the bony pelvis and proximal femur, with large lytic lesion on the right iliac crest (see report for other findings).  Admitted for management of intractable pain.  Oncology is following.  Consultations:  Medical oncology  Radiation oncology  Palliative care  Procedures:  Radiation treatments   HOSPITAL COURSE:   Intractable paindue to bony metastatic disease Most pain currently in right hip, ankle, knee, back.   Palliative care for was consulted for pain management.  They have adjusted her pain medications.  MS Contin dose was increased.  She also remains on OxyIR.    She was also started on Robaxin and dexamethasone.  Dexamethasone will be tapered down. Bowel regimen.  She finally had a large bowel movement on 6/21 after she  was given the enema.  She will be prescribed suppositories as well.  Patient was also seen by radiation oncology.  She will undergo for first treatment today and then will need to return daily for treatments.  Patient has enough social support to be able to do so.  Patient was seen by physical therapy.  Home health was recommended however patient declines.  She mentions that she has all the necessary equipment at home.   Leiomyosarcoma Followed by oncologist Dr. Jana Hakim. Considering palliative radiation for bony mets and pain relief.  Planning for 2nd opinion at [facility].  Hypokalemia Potassium level corrected.  Magnesium 2.0.    Diabetes mellitus type 2 HbA1c 6.7.  May resume Metformin.  Elevated glucose level due to dexamethasone.  Should improve as it is tapered down.  Essential hypertension Continue carvedilol and hydrochlorothiazide.  Apparently no longer taking in the ARB.  Hypothyroidism Continue Synthroid  Morbid Obesity Estimated body mass index is 41.09 kg/m as calculated from the following:   Height as of this encounter: 5\' 2"  (1.575 m).   Weight as of this encounter: 101.9 kg.   Overall she is stable.  She can go home later today after her radiation treatment.  Discussed with her daughter-in-law.   PERTINENT LABS:  The results of significant diagnostics from this hospitalization (including imaging, microbiology, ancillary and laboratory) are listed below for reference.    Microbiology: Recent Results (from the past 240 hour(s))  SARS Coronavirus 2 by RT PCR (hospital order, performed in [facility] hospital lab) Nasopharyngeal Nasopharyngeal Swab     Status: None   Collection Time: 02/26/20  4:10 PM   Specimen: Nasopharyngeal Swab  Result Value Ref Range Status   SARS Coronavirus 2 NEGATIVE NEGATIVE Final    Comment: (NOTE) SARS-CoV-2 target nucleic  acids are NOT DETECTED.  The SARS-CoV-2 RNA is generally detectable in upper and lower respiratory specimens  during the acute phase of infection. The lowest concentration of SARS-CoV-2 viral copies this assay can detect is 250 copies / mL. A negative result does not preclude SARS-CoV-2 infection and should not be used as the sole basis for treatment or other patient management decisions.  A negative result may occur with improper specimen collection / handling, submission of specimen other than nasopharyngeal swab, presence of viral mutation(s) within the areas targeted by this assay, and inadequate number of viral copies (<250 copies / mL). A negative result must be combined with clinical observations, patient history, and epidemiological information.  Fact Sheet for Patients:   StrictlyIdeas.no  Fact Sheet for Healthcare Providers: BankingDealers.co.za  This test is not yet approved or  cleared by the [place] FDA and has been authorized for detection and/or diagnosis of SARS-CoV-2 by FDA under an Emergency Use Authorization (EUA).  This EUA will remain in effect (meaning this test can be used) for the duration of the COVID-19 declaration under Section 564(b)(1) of the Act, 21 U.S.C. section 360bbb-3(b)(1), unless the authorization is terminated or revoked sooner.  Performed at [facility], [place] [address] [place],  [address]      Labs:     Basic Metabolic Panel: Recent Labs  Lab 02/26/20 0938 02/28/20 0717 02/29/20 0539  NA 139 135 134*  K 3.5 3.2* 4.1  CL 102 103 99  CO2 24 24 24   GLUCOSE 125* 107* 193*  BUN 14 18 26*  CREATININE 0.65 0.74 0.83  CALCIUM 9.7 9.0 9.3  MG  --  1.8 2.0   Liver Function Tests: Recent Labs  Lab 02/26/20 0938  AST 22  ALT 20  ALKPHOS 88  BILITOT 1.0  PROT 8.8*  ALBUMIN 4.9   CBC: Recent Labs  Lab 02/26/20 0938 02/27/20 0643  WBC 8.1 6.1  NEUTROABS 5.1  --   HGB 13.6 11.1*  HCT 41.8 34.3*  MCV 85.5 86.0  PLT 138* 161   Cardiac Enzymes: Recent  Labs  Lab 02/26/20 0938  CKTOTAL 133    CBG: Recent Labs  Lab 03/01/20 1150 03/01/20 1646 03/01/20 2152 03/02/20 0730 03/02/20 1107  GLUCAP 186* 168* 235* 139* 188*     IMAGING STUDIES DG Thoracic Spine 4V  Result Date: 02/26/2020 CLINICAL DATA:  Known metastatic disease. Unknown primary. Sudden onset of mid to low back pain extending into the right lower extremity. EXAM: THORACIC SPINE - 4+ VIEW COMPARISON:  CT of the chest 01/10/2020 FINDINGS: 12 rib-bearing thoracic type vertebral bodies are present. Progressive diffuse pulmonary metastases are noted. Vertebral body heights maintained. No acute or healing fractures are present. Degenerative endplate changes are noted throughout the thoracic spine. Sternum and visualized ribs are within normal limits. Degenerative changes are present the lower cervical spine as well. IMPRESSION: 1. Progressive diffuse pulmonary metastases. 2. No acute or healing fractures. 3. Degenerative changes throughout the thoracic and cervical spine. Electronically Signed   By: San Morelle M.D.   On: 02/26/2020 09:28   DG Lumbar Spine Complete  Result Date: 02/26/2020 CLINICAL DATA:  Severe right sided mid back pain and leg pain beginning at 1 a.m. Known metastatic disease the bone. EXAM: LUMBAR SPINE - COMPLETE 4+ VIEW COMPARISON:  PET scan 02/05/2020. FINDINGS: Five non rib-bearing lumbar type vertebral bodies are present. Vertebral body heights are maintained. No acute or healing fractures are present. No significant listhesis is present.  Advanced degenerative changes are noted in the lower lumbar facets. Metastatic disease is less apparent by plain film radiographs. Soft tissues are unremarkable. Atherosclerotic calcifications are present. IMPRESSION: 1. Advanced degenerative changes in the lower lumbar facets without acute or healing fractures. 2. Metastatic disease is less apparent by plain film radiographs. Electronically Signed   By: San Morelle M.D.   On: 02/26/2020 09:25   DG Tibia/Fibula Right  Result Date: 02/26/2020 CLINICAL DATA:  Back pain radiating into the right lower leg. No known injury. EXAM: RIGHT TIBIA AND FIBULA - 2 VIEW COMPARISON:  None. FINDINGS: There is no acute bony or joint abnormality. Osteoarthritis about the knee is most severe in the patellofemoral compartment. Degenerative change at the tibiotalar joint and articulations of the midfoot is also identified. Calcaneal spurring is present. Soft tissues are unremarkable. IMPRESSION: No acute abnormality. Multifocal osteoarthritis. Electronically Signed   By: Inge Rise M.D.   On: 02/26/2020 09:24   MR HIP RIGHT W WO CONTRAST  Result Date: 02/26/2020 CLINICAL DATA:  Severe right mid back pain radiating into the right leg today. No acute injury. Metastatic carcinoma of unknown primary. EXAM: MRI OF THE RIGHT HIP WITHOUT AND WITH CONTRAST TECHNIQUE: Multiplanar, multisequence MR imaging was performed both before and after administration of intravenous contrast. CONTRAST:  61mL GADAVIST GADOBUTROL 1 MMOL/ML IV SOLN COMPARISON:  PET-CT 02/05/2020. FINDINGS: Despite efforts by the technologist and patient, mild motion artifact is present on today's exam and could not be eliminated. This reduces exam sensitivity and specificity. Bones: There is widespread osseous metastatic disease which appears progressive compared with previous PET-CT. Largest lesions include a 3.3 cm in the subtrochanteric region of the proximal right femur (image 14/6) and a 2.1 cm lesion near the right anterior inferior iliac spine (image 5/6). Additional lesions are present throughout the bony pelvis, including bilateral sacral lesions and prominent involvement of the L5 vertebral body on the right. There is a large lesion involving the right iliac crest with extraosseous soft tissue extension, measuring up to 4.8 cm on image 10/4. There is a small lesion in the proximal left femoral diaphysis. No  definite pathologic fracture at this time. Articular cartilage and labrum Articular cartilage: No focal chondral defect or subchondral signal abnormality identified. Labrum: There is no gross labral tear or paralabral abnormality. Joint or bursal effusion Joint effusion: No significant hip joint effusion. Bursae: No focal periarticular fluid collection. Muscles and tendons Muscles and tendons: Partial tear of the left common hamstring tendon. The gluteus and iliopsoas tendons appear intact. The location of the lesion at the right anterior inferior iliac spine predisposes to avulsive injury of the rectus femoris tendon. Other findings Miscellaneous: 1.7 cm subcutaneous metastasis noted superficial to the right tensor fascia lata muscle on image 5/6. IMPRESSION: 1. Progressive widespread osseous metastatic disease throughout the bony pelvis and proximal right femur. No definite pathologic fracture at this time. 2. Large lesion involving the right iliac CREST with extraosseous soft tissue extension. The location of the lesion at the right anterior inferior iliac spine predisposes to avulsive injury of the rectus femoris tendon. 3. 1.7 cm subcutaneous metastasis superficial to the right tensor fascia lata muscle. 4. Partial tear of the left common hamstring tendon. Electronically Signed   By: Richardean Sale M.D.   On: 02/26/2020 15:16   NM PET Image Initial (PI) Skull Base To Thigh  Result Date: 02/05/2020 CLINICAL DATA:  Initial treatment strategy for carcinoma unknown primary. COVID vaccine LEFT arm EXAM: NUCLEAR MEDICINE PET  SKULL BASE TO THIGH TECHNIQUE: 10.2 mCi F-18 FDG was injected intravenously. Full-ring PET imaging was performed from the skull base to thigh after the radiotracer. CT data was obtained and used for attenuation correction and anatomic localization. Portion the dose infiltrated in the RIGHT antecubital fossa. While there is intense activity in the arm, there is adequate radiotracer activity  throughout the body to perform a diagnostic scan. Fasting blood glucose: 148 mg/dl COMPARISON:  Chest CT 220 FINDINGS: Mediastinal blood pool activity: SUV max 2.25 Liver activity: SUV max 3.6 NECK: No hypermetabolic lymph nodes in the neck. Incidental CT findings: none CHEST: Multiple round pulmonary nodules of varying size. These lesions of mixed metabolic activity. Some lesion without activity. Several lesions do have metabolic activity albeit moderate for size. For example, 1.8 cm lesion in the LEFT upper lobe (image 24) with SUV max equal 3.7. 1.7 cm nodule RIGHT lower lobe SUV max equal 2.5. These the several small nodules with activity. For example LEFT upper lobe nodule measuring 0.9 cm with SUV max 2.0 Within the paraspinal musculature of the lower LEFT back (adjacent to the LEFT twelfth rib), there is soft tissue mass measuring 4.0 cm (image 120/4) with moderate metabolic activity (SUV max equal 3.9. Incidental CT findings: none ABDOMEN/PELVIS: No abnormal activity in liver. Adrenal glands are normal. No hypermetabolic activity associated with colon reproductive organs. Pancreas normal. Spleen normal. Incidental CT findings: Post hysterectomy.  Adnexa unremarkable. SKELETON: There is expansile lytic lesion within the medial RIGHT iliac bone measuring 3.5 cm (image 146/4) with SUV max equal 4.0. The small lytic lesion in the RIGHT iliac bone adjacent to the acetabulum. The soft tissue component measuring 1.5 cm SUV max equal 4. Mild radiotracer activity associated with a RIGHT lateral rib lesion with SUV max equal 3.0 on image 58. Incidental CT findings: Infiltration of radiotracer in the RIGHT arm IMPRESSION: 1. Unusual pattern of metastatic disease with multiple organ systems involved (pulmonary metastasis, bone metastasis, and muscle metastasis) and relatively moderate metabolic activity. Findings suggest a malignancy with relatively low FDG avidity such as thyroid carcinoma, renal cell carcinoma or  myeloma. 2. Biopsy target sites could include the hypermetabolic soft tissue mass in the LEFT paraspinal musculature or one of the two hypermetabolic lytic lesions in the RIGHT iliac bone. 3. RIGHT arm radiotracer infiltration. Electronically Signed   By: Suzy Bouchard M.D.   On: 02/05/2020 09:32   CT Biopsy  Result Date: 02/17/2020 INDICATION: 74 year old female with multifocal metastatic disease of uncertain primary etiology. She has a muscular metastasis in the left paraspinal muscles and presents for CT-guided biopsy of the same. EXAM: CT-guided biopsy MEDICATIONS: None. ANESTHESIA/SEDATION: Moderate (conscious) sedation was employed during this procedure. A total of Versed 2 mg and Fentanyl 100 mcg was administered intravenously. Moderate Sedation Time: 10 minutes. The patient's level of consciousness and vital signs were monitored continuously by radiology nursing throughout the procedure under my direct supervision. FLUOROSCOPY TIME:  None COMPLICATIONS: None immediate. PROCEDURE: Informed written consent was obtained from the patient after a thorough discussion of the procedural risks, benefits and alternatives. All questions were addressed. A timeout was performed prior to the initiation of the procedure. A planning axial CT scan was performed. The soft tissue mass in the left paraspinal musculature was successfully identified. The overlying skin was sterilely prepped and draped in the standard fashion using chlorhexidine skin prep. Local anesthesia was attained by infiltration with 1% lidocaine. A small dermatotomy was made. Under intermittent CT guidance, a 17 gauge introducer  needle was advanced and positioned at the margin of the mass. Multiple 18 gauge core biopsies were then coaxially obtained using the bio Pince automated biopsy device. Biopsy specimens were placed in formalin and delivered to pathology for further analysis. No immediate complication. The patient tolerated the procedure well.  IMPRESSION: Successful CT-guided core biopsy of left paraspinal soft tissue mass. Electronically Signed   By: Jacqulynn Cadet M.D.   On: 02/17/2020 14:04    DISCHARGE EXAMINATION: Vitals:   03/01/20 1654 03/01/20 2149 03/02/20 0555 03/02/20 1325  BP: (!) 171/76 (!) 131/54 (!) 155/94 (!) 153/67  Pulse: (!) 58 (!) 57 (!) 52 (!) 54  Resp: 18 16 16 18   Temp: 97.9 F (36.6 C) 98.3 F (36.8 C) 97.9 F (36.6 C) 97.9 F (36.6 C)  TempSrc: Oral Oral Oral Oral  SpO2: 94% 93% 95% 96%  Weight:      Height:       General appearance: Awake alert.  In no distress Resp: Clear to auscultation bilaterally.  Normal effort Cardio: S1-S2 is normal regular.  No S3-S4.  No rubs murmurs or bruit GI: Abdomen is soft.  Nontender nondistended.  Bowel sounds are present normal.  No masses organomegaly    DISPOSITION: Home  Discharge Instructions    Call MD for:  difficulty breathing, headache or visual disturbances   Complete by: As directed    Call MD for:  extreme fatigue   Complete by: As directed    Call MD for:  persistant dizziness or light-headedness   Complete by: As directed    Call MD for:  persistant nausea and vomiting   Complete by: As directed    Call MD for:  severe uncontrolled pain   Complete by: As directed    Call MD for:  temperature >100.4   Complete by: As directed    Diet Carb Modified   Complete by: As directed    Discharge instructions   Complete by: As directed    Please follow-up with your medical oncologist. And please continue with radiation treatments.  You were cared for by a hospitalist during your hospital stay. If you have any questions about your discharge medications or the care you received while you were in the hospital after you are discharged, you can call the unit and asked to speak with the hospitalist on call if the hospitalist that took care of you is not available. Once you are discharged, your primary care physician will handle any further medical  issues. Please note that NO REFILLS for any discharge medications will be authorized once you are discharged, as it is imperative that you return to your primary care physician (or establish a relationship with a primary care physician if you do not have one) for your aftercare needs so that they can reassess your need for medications and monitor your lab values. If you do not have a primary care physician, you can call [phone] for a physician referral.   Increase activity slowly   Complete by: As directed          Allergies as of 03/02/2020      Reactions   Lisinopril Cough      Medication List    STOP taking these medications   HYDROcodone-acetaminophen 5-325 MG tablet Commonly known as: NORCO/VICODIN   losartan 50 MG tablet Commonly known as: COZAAR   Vitamin D (Ergocalciferol) 1.25 MG (50000 UNIT) Caps capsule Commonly known as: DRISDOL     TAKE these medications   aspirin  EC 81 MG tablet Take 81 mg by mouth daily.   bisacodyl 10 MG suppository Commonly known as: DULCOLAX Place 1 suppository (10 mg total) rectally daily as needed for moderate constipation.   carvedilol 25 MG tablet Commonly known as: COREG Take 25 mg by mouth 2 (two) times daily with a meal.   dexamethasone 2 MG tablet Commonly known as: DECADRON Take 2 tablets twice daily for 3 days, then take 2 tablets once daily for 3 days, then take 1 tablet once daily for 3 days, then STOP.   Fish Oil 435 MG Caps Take 435 mg by mouth daily at 2 PM.   glucose blood test strip Check BS twice daily   hydrochlorothiazide 25 MG tablet Commonly known as: HYDRODIURIL Take 25 mg by mouth daily.   levothyroxine 125 MCG tablet Commonly known as: SYNTHROID Take 125 mcg by mouth daily before breakfast.   lovastatin 40 MG tablet Commonly known as: MEVACOR Take 40 mg by mouth at bedtime.   metFORMIN 850 MG tablet Commonly known as: GLUCOPHAGE Take 1 tablet (850 mg total) by mouth daily. with food     methocarbamol 500 MG tablet Commonly known as: ROBAXIN Take 2 tablets (1,000 mg total) by mouth 4 (four) times daily.   morphine 60 MG 12 hr tablet Commonly known as: MS CONTIN Take 1 tablet (60 mg total) by mouth every 12 (twelve) hours.   Oxycodone HCl 20 MG Tabs Take 1 tablet (20 mg total) by mouth every 3 (three) hours as needed for severe pain.   polyethylene glycol 17 g packet Commonly known as: MIRALAX / GLYCOLAX Take 17 g by mouth 2 (two) times daily.   senna 8.6 MG Tabs tablet Commonly known as: SENOKOT Take 2 tablets (17.2 mg total) by mouth 2 (two) times daily.         Follow-up Information    Orpah Melter, MD. Schedule an appointment as soon as possible for a visit in 2 week(s).   Specialty: Family Medicine Contact information: [address] [place] [address] [phone]        Magrinat, Virgie Dad, MD Follow up.   Specialty: Oncology Contact information: [place] [address] [phone]               TOTAL DISCHARGE TIME: 35 mins  [facility] Pager on [email or web address]  03/02/2020, 2:21 PM

## 2020-03-02 NOTE — Progress Notes (Signed)
Daily Progress Note   Patient Name: Samantha Lozano       Date: 03/02/2020 DOB: May 26, 1946  Age: 74 y.o. MRN#: [id number] Attending Physician: Bonnielee Haff, MD Primary Care Physician: Orpah Melter, MD Admit Date: 02/26/2020  Reason for Consultation/Follow-up: Establishing goals of care, Non pain symptom management and Pain control  Subjective: Samantha Lozano is awake alert, resting in bed, daughter in law at bedside, has a radiation appointment late afternoon, likely for d/c home after that.        Length of Stay: 5  Current Medications: Scheduled Meds:   acetaminophen  1,000 mg Oral Q8H   aspirin EC  81 mg Oral Daily   carvedilol  25 mg Oral BID WC   dexamethasone  4 mg Oral Q12H   enoxaparin (LOVENOX) injection  40 mg Subcutaneous Q24H   hydrochlorothiazide  25 mg Oral Daily   insulin aspart  0-9 Units Subcutaneous TID WC   levothyroxine  125 mcg Oral Q0600   losartan  50 mg Oral Daily   methocarbamol  1,000 mg Oral QID   morphine  60 mg Oral Q12H   omega-3 acid ethyl esters  1 g Oral Daily   polyethylene glycol  17 g Oral BID   pravastatin  40 mg Oral q1800   senna  2 tablet Oral BID    Continuous Infusions:  sodium chloride 10 mL/hr (02/26/20 0933)    PRN Meds: alum & mag hydroxide-simeth, bisacodyl, hydrALAZINE, HYDROmorphone (DILAUDID) injection, magnesium citrate, ondansetron **OR** ondansetron (ZOFRAN) IV, oxyCODONE, traZODone  Physical Exam         General: Alert, awake, in no acute distress.  Heart: Regular rate and rhythm. No murmur appreciated. Lungs: Good air movement, clear Abdomen: Soft, nontender, nondistended, positive bowel sounds.  Ext: No significant edema Skin: Warm and dry Neuro: Grossly intact, nonfocal.  Vital Signs: BP  (!) 155/94 (BP Location: Right Arm)    Pulse (!) 52    Temp 97.9 F (36.6 C) (Oral)    Resp 16    Ht 5\' 2"  (1.575 m)    Wt 101.9 kg    SpO2 95%    BMI 41.09 kg/m  SpO2: SpO2: 95 % O2 Device: O2 Device: Room Air O2 Flow Rate:    Intake/output summary:   Intake/Output Summary (Last 24 hours) at 03/02/2020  1034 Last data filed at 03/02/2020 7628 Gross per 24 hour  Intake 480 ml  Output --  Net 480 ml   LBM: Last BM Date: 02/29/20 Baseline Weight: Weight: 99.8 kg Most recent weight: Weight: 101.9 kg       Palliative Assessment/Data:      Patient Active Problem List   Diagnosis Date Noted   Palliative care by specialist    Goals of care, counseling/discussion    Generalized pain    Bony metastasis ([place]) 02/26/2020   Intractable pain 02/26/2020   Leiomyosarcoma ([place]) 02/26/2020   Hypothyroidism 02/26/2020   Bone metastases ([place]) 01/22/2020   Lung metastases ([place]) 01/13/2020   Osteoarthritis of both knees 12/14/2019   Pain in both knees 11/26/2019   Essential hypertension 09/16/2019   Depression 07/06/2019   Class 2 severe obesity with serious comorbidity and body mass index (BMI) of 36.0 to 36.9 in adult ([place]) 07/30/2018   Other fatigue 03/10/2018   Shortness of breath on exertion 03/10/2018   Type 2 diabetes mellitus ([place]) 03/10/2018   Abnormal EKG 03/10/2018   Vitamin D deficiency 03/10/2018   B12 nutritional deficiency 03/10/2018    Palliative Care Assessment & Plan   Patient Profile: 74 year old female with newly diagnosed leiomyosarcoma with bony mets admitted for uncontrolled cancer related pain.  Assessment: Patient Active Problem List   Diagnosis Date Noted   Palliative care by specialist    Goals of care, counseling/discussion    Generalized pain    Bony metastasis ([place]) 02/26/2020   Intractable pain 02/26/2020   Leiomyosarcoma ([place]) 02/26/2020   Hypothyroidism 02/26/2020   Bone metastases ([place]) 01/22/2020   Lung  metastases ([place]) 01/13/2020   Osteoarthritis of both knees 12/14/2019   Pain in both knees 11/26/2019   Essential hypertension 09/16/2019   Depression 07/06/2019   Class 2 severe obesity with serious comorbidity and body mass index (BMI) of 36.0 to 36.9 in adult ([place]) 07/30/2018   Other fatigue 03/10/2018   Shortness of breath on exertion 03/10/2018   Type 2 diabetes mellitus ([place]) 03/10/2018   Abnormal EKG 03/10/2018   Vitamin D deficiency 03/10/2018   B12 nutritional deficiency 03/10/2018    Recommendations/Plan:   Cancer-related pain: MAR checked, continue with current opioids and adjuvants. On scheduled Tylenol, on Samantha Contin 60 mg Q 12 hours scheduled. PRN usage of Oxy IR 20 mg PO available 3 hours as needed. To start radiation today. Constipation, opioid related: she had a bowel movement on 6-21, continue current bowel regimen for now. Add Dulcolax rectal suppository prn, discussed with patient and daughter in law.    Goals of care: Await input from [facility] regarding potential disease modifying therapy options. Offered active listening and supportive care. Recommend home based palliative care on discharge.   D/C recommendations from PMT standpoint:  Home with palliative services following.   Samantha Contin 60 mg PO Q 12 hours scheduled, Oxy IR 20  mg PO Q 3 hours prn.   Current bowel regimen.   Goals of Care and Additional Recommendations:  Limitations on Scope of Treatment: Full Scope Treatment  Code Status:    Code Status Orders  (From admission, onward)         Start     Ordered   02/26/20 1815  Full code  Continuous        02/26/20 1820        Code Status History    This patient has a current code status but no historical code status.   Advance  Care Planning Activity    Advance Directive Documentation     Most Recent Value  Type of Advance Directive Healthcare Power of Attorney, Living will  Pre-existing out of facility DNR order (yellow form or pink  MOST form) --  "MOST" Form in Place? --       Prognosis:   Unable to determine  Discharge Planning:  Home with Home Health most likely, recommend outpatient palliative.   Care plan was discussed with patient, family  Thank you for allowing the Palliative Medicine Team to assist in the care of this patient.   Total Time 35 Prolonged Time Billed No   Greater than 50%  of this time was spent counseling and coordinating care related to the above assessment and plan.  Loistine Chance, MD  Please contact Palliative Medicine Team phone at [phone] for questions and concerns.

## 2020-03-02 NOTE — Plan of Care (Signed)
  Problem: Activity: Goal: Risk for activity intolerance will decrease Outcome: Progressing   Problem: Nutrition: Goal: Adequate nutrition will be maintained Outcome: Progressing   

## 2020-03-03 ENCOUNTER — Ambulatory Visit
Admission: RE | Admit: 2020-03-03 | Discharge: 2020-03-03 | Disposition: A | Discharge status: Home / Self Care | Payer: PPO | Source: Ambulatory Visit | Attending: Radiation Oncology | Admitting: Radiation Oncology

## 2020-03-03 ENCOUNTER — Other Ambulatory Visit: Payer: Self-pay

## 2020-03-03 ENCOUNTER — Emergency Department (HOSPITAL_COMMUNITY)
Admission: EM | Admit: 2020-03-03 | Discharge: 2020-03-03 | Disposition: A | Discharge status: Home / Self Care | Payer: PPO | Attending: Emergency Medicine | Admitting: Emergency Medicine

## 2020-03-03 ENCOUNTER — Encounter (HOSPITAL_COMMUNITY): Payer: Self-pay

## 2020-03-03 DIAGNOSIS — Z7982 Long term (current) use of aspirin: Secondary | ICD-10-CM | POA: Insufficient documentation

## 2020-03-03 DIAGNOSIS — E039 Hypothyroidism, unspecified: Secondary | ICD-10-CM | POA: Insufficient documentation

## 2020-03-03 DIAGNOSIS — I213 ST elevation (STEMI) myocardial infarction of unspecified site: Secondary | ICD-10-CM | POA: Diagnosis not present

## 2020-03-03 DIAGNOSIS — Z7984 Long term (current) use of oral hypoglycemic drugs: Secondary | ICD-10-CM | POA: Diagnosis not present

## 2020-03-03 DIAGNOSIS — R112 Nausea with vomiting, unspecified: Secondary | ICD-10-CM | POA: Diagnosis not present

## 2020-03-03 DIAGNOSIS — Z888 Allergy status to other drugs, medicaments and biological substances status: Secondary | ICD-10-CM | POA: Insufficient documentation

## 2020-03-03 DIAGNOSIS — Z79899 Other long term (current) drug therapy: Secondary | ICD-10-CM | POA: Insufficient documentation

## 2020-03-03 DIAGNOSIS — R1084 Generalized abdominal pain: Secondary | ICD-10-CM | POA: Diagnosis not present

## 2020-03-03 DIAGNOSIS — I10 Essential (primary) hypertension: Secondary | ICD-10-CM | POA: Insufficient documentation

## 2020-03-03 DIAGNOSIS — E119 Type 2 diabetes mellitus without complications: Secondary | ICD-10-CM | POA: Diagnosis not present

## 2020-03-03 DIAGNOSIS — R52 Pain, unspecified: Secondary | ICD-10-CM | POA: Diagnosis not present

## 2020-03-03 DIAGNOSIS — G4489 Other headache syndrome: Secondary | ICD-10-CM | POA: Diagnosis not present

## 2020-03-03 DIAGNOSIS — R404 Transient alteration of awareness: Secondary | ICD-10-CM | POA: Diagnosis not present

## 2020-03-03 LAB — COMPREHENSIVE METABOLIC PANEL
ALT: 28 U/L (ref 0–44)
AST: 19 U/L (ref 15–41)
Albumin: 4.4 g/dL (ref 3.5–5.0)
Alkaline Phosphatase: 70 U/L (ref 38–126)
Anion gap: 14 (ref 5–15)
BUN: 31 mg/dL — ABNORMAL HIGH (ref 8–23)
CO2: 26 mmol/L (ref 22–32)
Calcium: 9.3 mg/dL (ref 8.9–10.3)
Chloride: 95 mmol/L — ABNORMAL LOW (ref 98–111)
Creatinine, Ser: 0.76 mg/dL (ref 0.44–1.00)
GFR calc Af Amer: >60 mL/min (ref >60)
GFR calc non Af Amer: >60 mL/min (ref >60)
Glucose, Bld: 182 mg/dL — ABNORMAL HIGH (ref 70–99)
Potassium: 3.7 mmol/L (ref 3.5–5.1)
Sodium: 135 mmol/L (ref 135–145)
Total Bilirubin: 0.9 mg/dL (ref 0.3–1.2)
Total Protein: 8.1 g/dL (ref 6.5–8.1)

## 2020-03-03 LAB — CBC WITH DIFFERENTIAL/PLATELET
Abs Immature Granulocytes: 0.07 10*3/uL (ref 0.00–0.07)
Basophils Absolute: 0 10*3/uL (ref 0.0–0.1)
Basophils Relative: 0 %
Eosinophils Absolute: 0 10*3/uL (ref 0.0–0.5)
Eosinophils Relative: 0 %
HCT: 40.9 % (ref 36.0–46.0)
Hemoglobin: 13.4 g/dL (ref 12.0–15.0)
Immature Granulocytes: 1 %
Lymphocytes Relative: 12 %
Lymphs Abs: 1.2 10*3/uL (ref 0.7–4.0)
MCH: 27.6 pg (ref 26.0–34.0)
MCHC: 32.8 g/dL (ref 30.0–36.0)
MCV: 84.2 fL (ref 80.0–100.0)
Monocytes Absolute: 0.5 10*3/uL (ref 0.1–1.0)
Monocytes Relative: 5 %
Neutro Abs: 8.1 10*3/uL — ABNORMAL HIGH (ref 1.7–7.7)
Neutrophils Relative %: 82 %
Platelets: 238 10*3/uL (ref 150–400)
RBC: 4.86 MIL/uL (ref 3.87–5.11)
RDW: 12.7 % (ref 11.5–15.5)
WBC: 10 10*3/uL (ref 4.0–10.5)
nRBC: 0 % (ref 0.0–0.2)

## 2020-03-03 LAB — URINALYSIS, ROUTINE W REFLEX MICROSCOPIC
Bacteria, UA: NONE SEEN
Bilirubin Urine: NEGATIVE
Glucose, UA: NEGATIVE mg/dL
Ketones, ur: NEGATIVE mg/dL
Nitrite: NEGATIVE
Protein, ur: NEGATIVE mg/dL
Specific Gravity, Urine: 1.02 (ref 1.005–1.030)
pH: 5 (ref 5.0–8.0)

## 2020-03-03 MED ORDER — ONDANSETRON 4 MG PO TBDP
4.0000 mg | ORAL_TABLET | Freq: Once | ORAL | Status: AC
  Administered 2020-03-03: 4 mg via ORAL
  Filled 2020-03-03: qty 1

## 2020-03-03 MED ORDER — ONDANSETRON HCL 4 MG/2ML IJ SOLN
4.0000 mg | Freq: Once | INTRAMUSCULAR | Status: DC
  Filled 2020-03-03: qty 2

## 2020-03-03 MED ORDER — MORPHINE SULFATE ER 30 MG PO TBCR
60.0000 mg | EXTENDED_RELEASE_TABLET | Freq: Once | ORAL | Status: DC
  Filled 2020-03-03: qty 4

## 2020-03-03 MED ORDER — OXYCODONE HCL 5 MG PO TABS
20.0000 mg | ORAL_TABLET | Freq: Once | ORAL | Status: AC
  Administered 2020-03-03: 20 mg via ORAL
  Filled 2020-03-03: qty 4

## 2020-03-03 MED ORDER — ONDANSETRON HCL 4 MG PO TABS
4.0000 mg | ORAL_TABLET | Freq: Four times a day (QID) | ORAL | 0 refills | Status: DC | PRN
Start: 2020-03-03 — End: 2020-03-03

## 2020-03-03 MED ORDER — ONDANSETRON HCL 4 MG PO TABS
4.0000 mg | ORAL_TABLET | Freq: Four times a day (QID) | ORAL | 0 refills | Status: DC | PRN
Start: 2020-03-03 — End: 2020-03-09

## 2020-03-03 MED ORDER — SODIUM CHLORIDE 0.9 % IV SOLN: INTRAVENOUS | Status: DC

## 2020-03-03 NOTE — ED Notes (Addendum)
Pt instructed to wait 5-10 minutes after administration of zofran, then to drink ginger ale and/or water, left at bedside and see how she does.

## 2020-03-03 NOTE — ED Provider Notes (Signed)
[facility] DEPT Provider Note   CSN: [id number] Arrival date & time: 03/03/20  1441     History Chief Complaint  Patient presents with  . Emesis  . Hypertension    Samantha Lozano is a 74 y.o. female.  HPI   5yF presenting with "I'm sick." Discharged from the hospital yesterday after being admitted with intractable cancer related pain due to bony metastatic disease. She says she feels the same as when she left but additionally began feeling increasingly nauseated after radiation treatment today and began vomiting. No fever. No new pain.   Past Medical History:  Diagnosis Date  . Diabetes mellitus, type 2 ([place])   . HLD (hyperlipidemia)   . HTN (hypertension)   . Leg edema   . Other specified disorders of thyroid     Patient Active Problem List   Diagnosis Date Noted  . Palliative care by specialist   . Goals of care, counseling/discussion   . Generalized pain   . Bony metastasis ([place]) 02/26/2020  . Intractable pain 02/26/2020  . Leiomyosarcoma ([place]) 02/26/2020  . Hypothyroidism 02/26/2020  . Bone metastases ([place]) 01/22/2020  . Lung metastases ([place]) 01/13/2020  . Osteoarthritis of both knees 12/14/2019  . Pain in both knees 11/26/2019  . Essential hypertension 09/16/2019  . Depression 07/06/2019  . Class 2 severe obesity with serious comorbidity and body mass index (BMI) of 36.0 to 36.9 in adult [facility]) 07/30/2018  . Other fatigue 03/10/2018  . Shortness of breath on exertion 03/10/2018  . Type 2 diabetes mellitus ([place]) 03/10/2018  . Abnormal EKG 03/10/2018  . Vitamin D deficiency 03/10/2018  . B12 nutritional deficiency 03/10/2018   Past Surgical History:  Procedure Laterality Date  . ABDOMINAL HYSTERECTOMY    . THYROID SURGERY      OB History    Gravida  1   Para      Term      Preterm      AB      Living  1     SAB      TAB      Ectopic      Multiple      Live Births             Family History    Problem Relation Age of Onset  . Diabetes Mother   . Hypertension Mother   . Cancer Mother   . Obesity Mother   . Hypertension Father   . Cancer Father   . Obesity Father    Social History   Tobacco Use  . Smoking status: Never Smoker  . Smokeless tobacco: Never Used  Substance Use Topics  . Alcohol use: Not Currently  . Drug use: Not Currently    Home Medications Prior to Admission medications   Medication Sig Start Date End Date Taking? Authorizing Provider  aspirin EC 81 MG tablet Take 81 mg by mouth daily.    [provider]  bisacodyl (DULCOLAX) 10 MG suppository Place 1 suppository (10 mg total) rectally daily as needed for moderate constipation. 03/02/20   Bonnielee Haff, MD  carvedilol (COREG) 25 MG tablet Take 25 mg by mouth 2 (two) times daily with a meal.    [provider]  dexamethasone (DECADRON) 2 MG tablet Take 2 tablets twice daily for 3 days, then take 2 tablets once daily for 3 days, then take 1 tablet once daily for 3 days, then STOP. 03/02/20   Bonnielee Haff, MD  glucose blood  test strip Check BS twice daily 04/30/19   Jearld Lesch A, DO  hydrochlorothiazide (HYDRODIURIL) 25 MG tablet Take 25 mg by mouth daily.    [provider]  levothyroxine (SYNTHROID, LEVOTHROID) 125 MCG tablet Take 125 mcg by mouth daily before breakfast.    [provider]  lovastatin (MEVACOR) 40 MG tablet Take 40 mg by mouth at bedtime.    [provider]  metFORMIN (GLUCOPHAGE) 850 MG tablet Take 1 tablet (850 mg total) by mouth daily. with food 08/17/19   Briscoe Deutscher, DO  methocarbamol (ROBAXIN) 500 MG tablet Take 2 tablets (1,000 mg total) by mouth 4 (four) times daily. 03/02/20   Bonnielee Haff, MD  morphine (MS CONTIN) 60 MG 12 hr tablet Take 1 tablet (60 mg total) by mouth every 12 (twelve) hours. 03/02/20   Bonnielee Haff, MD  Omega-3 Fatty Acids (FISH OIL) 435 MG CAPS Take 435 mg by mouth daily at 2 PM.     [provider]   oxyCODONE 20 MG TABS Take 1 tablet (20 mg total) by mouth every 3 (three) hours as needed for severe pain. 03/02/20   Bonnielee Haff, MD  polyethylene glycol (MIRALAX / GLYCOLAX) 17 g packet Take 17 g by mouth 2 (two) times daily. 03/02/20   Bonnielee Haff, MD  senna (SENOKOT) 8.6 MG TABS tablet Take 2 tablets (17.2 mg total) by mouth 2 (two) times daily. 03/02/20   Bonnielee Haff, MD   Allergies    Lisinopril  Review of Systems   Review of Systems All systems reviewed and negative, other than as noted in HPI.  Physical Exam Updated Vital Signs BP (!) 199/77 (BP Location: Left Arm)   Pulse 67   Temp (!) 97.5 F (36.4 C) (Oral)   Resp 13   Ht 5\' 2"  (1.575 m)   Wt 99.8 kg   SpO2 97%   BMI 40.24 kg/m   Physical Exam Vitals and nursing note reviewed.  Constitutional:      General: She is not in acute distress.    Appearance: She is well-developed.     Comments: Laying in bed.  Appears tired.  But in no acute distress.  HENT:     Head: Normocephalic and atraumatic.  Eyes:     General:        Right eye: No discharge.        Left eye: No discharge.     Conjunctiva/sclera: Conjunctivae normal.  Cardiovascular:     Rate and Rhythm: Regular rhythm.     Heart sounds: Normal heart sounds. No murmur heard.  No friction rub. No gallop.   Pulmonary:     Effort: Pulmonary effort is normal. No respiratory distress.     Breath sounds: Normal breath sounds.  Abdominal:     General: There is no distension.     Palpations: Abdomen is soft.     Tenderness: There is no abdominal tenderness.  Musculoskeletal:        General: No tenderness.     Cervical back: Neck supple.  Skin:    General: Skin is warm and dry.  Neurological:     Mental Status: She is alert.  Psychiatric:        Behavior: Behavior normal.        Thought Content: Thought content normal.     ED Results / Procedures / Treatments   Labs (all labs ordered are listed, but only abnormal results are displayed) Labs  Reviewed  CBC WITH DIFFERENTIAL/PLATELET - Abnormal;  Notable for the following components:      Result Value   Neutro Abs 8.1 (*)    All other components within normal limits  COMPREHENSIVE METABOLIC PANEL - Abnormal; Notable for the following components:   Chloride 95 (*)    Glucose, Bld 182 (*)    BUN 31 (*)    All other components within normal limits  URINALYSIS, ROUTINE W REFLEX MICROSCOPIC - Abnormal; Notable for the following components:   Hgb urine dipstick SMALL (*)    Leukocytes,Ua TRACE (*)    All other components within normal limits    EKG None  Radiology No results found.  Procedures Procedures (including critical care time)  Medications Ordered in ED Medications  0.9 %  sodium chloride infusion (has no administration in time range)  ondansetron (ZOFRAN) injection 4 mg (has no administration in time range)    ED Course  I have reviewed the triage vital signs and the nursing notes.  Pertinent labs & imaging results that were available during my care of the patient were reviewed by me and considered in my medical decision making (see chart for details).    MDM Rules/Calculators/A&P                         74 year old female with generalized fatigue and nausea.  Probably some element of deconditioning and also underlying metastatic disease and treatment.  She is now feeling much better after meds here in the emergency room.  She is not prescribed anything currently for nausea.  Prescription for Zofran provided.  She has been tolerating p.o. since she has been here.  No further vomiting.  Abdominal exam is reassuring.  Final Clinical Impression(s) / ED Diagnoses Final diagnoses:  Nausea and vomiting, intractability of vomiting not specified, unspecified vomiting type    Rx / DC Orders ED Discharge Orders         Ordered    ondansetron (ZOFRAN) 4 MG tablet  Every 6 hours PRN,   Status:  Discontinued     Reprint     03/03/20 2005    ondansetron (ZOFRAN) 4 MG  tablet  Every 6 hours PRN     Discontinue  Reprint     03/03/20 2028           Virgel Manifold, MD 03/04/20 2330

## 2020-03-03 NOTE — ED Triage Notes (Signed)
Pt came from home for vomiting and "just not feeling well." Pt is a cancer pt and had radiation this AM.

## 2020-03-03 NOTE — ED Notes (Signed)
Attempted [name] IV. Obtained labs. Pt states that line is too painful and requested removal. Removed at pt's request. Pt asks to see how the labs look and try PO zofran/fluids.

## 2020-03-04 ENCOUNTER — Ambulatory Visit: Payer: PPO

## 2020-03-07 ENCOUNTER — Ambulatory Visit
Admission: RE | Admit: 2020-03-07 | Discharge: 2020-03-07 | Disposition: A | Discharge status: Home / Self Care | Payer: PPO | Source: Ambulatory Visit | Attending: Radiation Oncology | Admitting: Radiation Oncology

## 2020-03-07 ENCOUNTER — Other Ambulatory Visit: Payer: Self-pay

## 2020-03-07 ENCOUNTER — Ambulatory Visit: Payer: PPO

## 2020-03-07 DIAGNOSIS — Z510 Encounter for antineoplastic radiation therapy: Secondary | ICD-10-CM | POA: Diagnosis not present

## 2020-03-07 DIAGNOSIS — C7951 Secondary malignant neoplasm of bone: Secondary | ICD-10-CM | POA: Diagnosis not present

## 2020-03-08 ENCOUNTER — Ambulatory Visit: Payer: PPO

## 2020-03-09 ENCOUNTER — Ambulatory Visit: Payer: PPO

## 2020-03-09 ENCOUNTER — Other Ambulatory Visit: Payer: Self-pay | Admitting: Radiation Oncology

## 2020-03-09 ENCOUNTER — Telehealth: Payer: Self-pay | Admitting: *Deleted

## 2020-03-09 MED ORDER — ONDANSETRON HCL 8 MG PO TABS
8.0000 mg | ORAL_TABLET | Freq: Three times a day (TID) | ORAL | 1 refills | Status: DC | PRN
Start: 2020-03-09 — End: 2020-03-31

## 2020-03-09 NOTE — Telephone Encounter (Signed)
This RN spoke with the patient's daughter - Samantha Lozano - per her call stating several concerns.  1. Referral to [facility]- they were notified that per insurance [facility] is out of network - and would need an authorization for visit to be scheduled. Samantha Lozano stated once authorization received they would hope they could a non in person visit ( virtual ) due to likelihood that pt would not be able to travel in car to [place].  2. Pt unable to go to radiation today due to not feeling very well today - she is having increased pain and nausea. Samantha Lozano states she spoke with Rad Onc nurse who will be following up with midlevel in radiation for additional nausea medications.  3. Samantha Lozano states she spoke with her mother's insurance case Freight forward[employer] and per discussion - it was advised that the patient and family might benefit from a palliative consult.  Samantha Lozano states they are very interested in pursuing this due to [name]'s status and not improving as well how to best manage the patient's needs. Samantha Lozano stated " this is all so new- I mean it's like one day she was out in her yard weed eating and then she was in the ER and is not doing well ".  This RN discussed palliative care- and will make referral to [facility].  Of note this RN contacted Dr Melburn Popper office as well and verified need for prior auth to be obtained by this office.  This RN contacted [facility]/palliative for referral and request to contact [name].  No further needs at this time- this note will be sent to covering provider for review of communication only.

## 2020-03-10 ENCOUNTER — Ambulatory Visit: Payer: PPO

## 2020-03-10 ENCOUNTER — Ambulatory Visit
Admission: RE | Admit: 2020-03-10 | Discharge: 2020-03-10 | Disposition: A | Discharge status: Home / Self Care | Payer: PPO | Source: Ambulatory Visit | Attending: Radiation Oncology | Admitting: Radiation Oncology

## 2020-03-10 ENCOUNTER — Other Ambulatory Visit: Payer: Self-pay

## 2020-03-10 DIAGNOSIS — Z510 Encounter for antineoplastic radiation therapy: Secondary | ICD-10-CM | POA: Insufficient documentation

## 2020-03-10 DIAGNOSIS — C7951 Secondary malignant neoplasm of bone: Secondary | ICD-10-CM | POA: Insufficient documentation

## 2020-03-11 ENCOUNTER — Ambulatory Visit
Admission: RE | Admit: 2020-03-11 | Discharge: 2020-03-11 | Disposition: A | Discharge status: Home / Self Care | Payer: PPO | Source: Ambulatory Visit | Attending: Radiation Oncology | Admitting: Radiation Oncology

## 2020-03-11 ENCOUNTER — Ambulatory Visit: Payer: PPO

## 2020-03-11 ENCOUNTER — Other Ambulatory Visit: Payer: Self-pay

## 2020-03-11 DIAGNOSIS — C7951 Secondary malignant neoplasm of bone: Secondary | ICD-10-CM | POA: Diagnosis not present

## 2020-03-11 DIAGNOSIS — Z510 Encounter for antineoplastic radiation therapy: Secondary | ICD-10-CM | POA: Diagnosis not present

## 2020-03-14 ENCOUNTER — Telehealth: Payer: Self-pay | Admitting: *Deleted

## 2020-03-14 NOTE — Telephone Encounter (Signed)
Received a Palliative care referral from Dr. Jana Hakim. Called and left a message from patient's daughter in law, [name]. My contact information given for a return call.

## 2020-03-15 ENCOUNTER — Ambulatory Visit
Admission: RE | Admit: 2020-03-15 | Discharge: 2020-03-15 | Disposition: A | Discharge status: Home / Self Care | Payer: PPO | Source: Ambulatory Visit | Attending: Radiation Oncology | Admitting: Radiation Oncology

## 2020-03-15 ENCOUNTER — Other Ambulatory Visit: Payer: Self-pay

## 2020-03-15 ENCOUNTER — Other Ambulatory Visit: Payer: Self-pay | Admitting: Oncology

## 2020-03-15 ENCOUNTER — Ambulatory Visit: Payer: PPO

## 2020-03-15 DIAGNOSIS — Z510 Encounter for antineoplastic radiation therapy: Secondary | ICD-10-CM | POA: Diagnosis not present

## 2020-03-16 ENCOUNTER — Ambulatory Visit
Admission: RE | Admit: 2020-03-16 | Discharge: 2020-03-16 | Disposition: A | Discharge status: Home / Self Care | Payer: PPO | Source: Ambulatory Visit | Attending: Radiation Oncology | Admitting: Radiation Oncology

## 2020-03-16 ENCOUNTER — Other Ambulatory Visit: Payer: Self-pay | Admitting: Oncology

## 2020-03-16 ENCOUNTER — Ambulatory Visit: Payer: PPO

## 2020-03-16 DIAGNOSIS — Z510 Encounter for antineoplastic radiation therapy: Secondary | ICD-10-CM | POA: Diagnosis not present

## 2020-03-17 ENCOUNTER — Other Ambulatory Visit: Payer: Self-pay

## 2020-03-17 ENCOUNTER — Ambulatory Visit: Payer: PPO

## 2020-03-17 ENCOUNTER — Telehealth: Payer: Self-pay | Admitting: *Deleted

## 2020-03-17 ENCOUNTER — Ambulatory Visit
Admission: RE | Admit: 2020-03-17 | Discharge: 2020-03-17 | Disposition: A | Discharge status: Home / Self Care | Payer: PPO | Source: Ambulatory Visit | Attending: Radiation Oncology | Admitting: Radiation Oncology

## 2020-03-17 DIAGNOSIS — E1159 Type 2 diabetes mellitus with other circulatory complications: Secondary | ICD-10-CM | POA: Diagnosis not present

## 2020-03-17 DIAGNOSIS — E782 Mixed hyperlipidemia: Secondary | ICD-10-CM | POA: Diagnosis not present

## 2020-03-17 DIAGNOSIS — I10 Essential (primary) hypertension: Secondary | ICD-10-CM | POA: Diagnosis not present

## 2020-03-17 DIAGNOSIS — Z510 Encounter for antineoplastic radiation therapy: Secondary | ICD-10-CM | POA: Diagnosis not present

## 2020-03-17 DIAGNOSIS — E039 Hypothyroidism, unspecified: Secondary | ICD-10-CM | POA: Diagnosis not present

## 2020-03-17 DIAGNOSIS — Z794 Long term (current) use of insulin: Secondary | ICD-10-CM | POA: Diagnosis not present

## 2020-03-17 DIAGNOSIS — C799 Secondary malignant neoplasm of unspecified site: Secondary | ICD-10-CM | POA: Diagnosis not present

## 2020-03-17 NOTE — Telephone Encounter (Signed)
This RN called to Dr Ardelle Anton at [facility] per follow up post receiving PA for consult.  Spoke with Herbert Spires NP coordinator for Dr Caleen Jobs- she states virtual visit can be done.  This rn gave her number for contact of daughter in law [place] for scheduling of virtual visit.  Tori request fax of PA authorization which this RN faxed with confirmation.

## 2020-03-18 ENCOUNTER — Ambulatory Visit: Payer: PPO

## 2020-03-18 ENCOUNTER — Ambulatory Visit
Admission: RE | Admit: 2020-03-18 | Discharge: 2020-03-18 | Disposition: A | Discharge status: Home / Self Care | Payer: PPO | Source: Ambulatory Visit | Attending: Radiation Oncology | Admitting: Radiation Oncology

## 2020-03-18 DIAGNOSIS — Z510 Encounter for antineoplastic radiation therapy: Secondary | ICD-10-CM | POA: Diagnosis not present

## 2020-03-19 ENCOUNTER — Ambulatory Visit: Payer: PPO

## 2020-03-20 ENCOUNTER — Ambulatory Visit: Payer: PPO

## 2020-03-21 ENCOUNTER — Other Ambulatory Visit: Payer: Self-pay

## 2020-03-21 ENCOUNTER — Encounter: Payer: Self-pay | Admitting: Radiation Oncology

## 2020-03-21 ENCOUNTER — Ambulatory Visit
Admission: RE | Admit: 2020-03-21 | Discharge: 2020-03-21 | Disposition: A | Discharge status: Home / Self Care | Payer: PPO | Source: Ambulatory Visit | Attending: Radiation Oncology | Admitting: Radiation Oncology

## 2020-03-21 ENCOUNTER — Ambulatory Visit: Payer: PPO

## 2020-03-21 DIAGNOSIS — Z510 Encounter for antineoplastic radiation therapy: Secondary | ICD-10-CM | POA: Diagnosis not present

## 2020-03-21 DIAGNOSIS — C7951 Secondary malignant neoplasm of bone: Secondary | ICD-10-CM | POA: Diagnosis not present

## 2020-03-22 ENCOUNTER — Ambulatory Visit: Payer: PPO

## 2020-03-22 ENCOUNTER — Telehealth: Payer: Self-pay | Admitting: *Deleted

## 2020-03-22 ENCOUNTER — Other Ambulatory Visit: Payer: Self-pay | Admitting: Oncology

## 2020-03-22 MED ORDER — MORPHINE SULFATE 30 MG PO TABS: 30.0000 mg | ORAL_TABLET | ORAL | 0 refills | Status: DC | PRN

## 2020-03-22 MED ORDER — MORPHINE SULFATE ER 60 MG PO TBCR: 120.0000 mg | EXTENDED_RELEASE_TABLET | Freq: Two times a day (BID) | ORAL | 0 refills | Status: DC

## 2020-03-22 NOTE — Telephone Encounter (Signed)
Received a return call from patient's daughter in law, [name], to schedule a Palliative care home visit. Visit scheduled for 7/16@2p .

## 2020-03-22 NOTE — Progress Notes (Signed)
I called [name] and spoke with [name] her daughter-in-law today.  Jakeria has not doing well.  She can barely walk.  She is in a great deal of pain.  It has taken hours until July 8 to get authorization from your insurance to [place].  [facility] now has the authorization and we have been trying to get a virtual visit scheduled so they could get a plan suggested by them before they see me on Friday.  Bihrle is using 60 mg of MSContin twice daily.  In addition she took 8 tablets of oxycodone 20 mg, namely 1 every 3 hours yesterday as well.  Despite that her pain is very poorly controlled.  We are increasing her MS Contin 220 mg twice daily starting with tonight's dose.  I am also writing for morphine 30 mg tablets which they may take every 3 hours as needed.  I have asked [name] to use this in preference of the oxycodone so we only have one type of narcotic on board.  So far there have been no problems with constipation  She will call me if there are any other issues.  Otherwise we will try to put it altogether on Friday.

## 2020-03-23 ENCOUNTER — Ambulatory Visit: Payer: PPO

## 2020-03-24 ENCOUNTER — Other Ambulatory Visit: Payer: Self-pay

## 2020-03-24 ENCOUNTER — Ambulatory Visit: Payer: PPO

## 2020-03-24 ENCOUNTER — Other Ambulatory Visit: Payer: Self-pay | Admitting: Oncology

## 2020-03-24 DIAGNOSIS — C7951 Secondary malignant neoplasm of bone: Secondary | ICD-10-CM | POA: Diagnosis not present

## 2020-03-24 DIAGNOSIS — C499 Malignant neoplasm of connective and soft tissue, unspecified: Secondary | ICD-10-CM | POA: Diagnosis not present

## 2020-03-24 DIAGNOSIS — C7989 Secondary malignant neoplasm of other specified sites: Secondary | ICD-10-CM | POA: Diagnosis not present

## 2020-03-24 DIAGNOSIS — R112 Nausea with vomiting, unspecified: Secondary | ICD-10-CM | POA: Diagnosis not present

## 2020-03-24 DIAGNOSIS — C7800 Secondary malignant neoplasm of unspecified lung: Secondary | ICD-10-CM

## 2020-03-24 DIAGNOSIS — C7801 Secondary malignant neoplasm of right lung: Secondary | ICD-10-CM | POA: Diagnosis not present

## 2020-03-24 DIAGNOSIS — C7802 Secondary malignant neoplasm of left lung: Secondary | ICD-10-CM | POA: Diagnosis not present

## 2020-03-24 MED ORDER — PROMETHAZINE HCL 25 MG RE SUPP
25.0000 mg | Freq: Four times a day (QID) | RECTAL | 0 refills | Status: AC | PRN
Start: 2020-03-24 — End: ?

## 2020-03-24 NOTE — Progress Notes (Signed)
[place]  Telephone:([phone]) [phone] Fax:(336) [phone]     ID: Samantha Lozano DOB: 09/03/1946  MR#: [id number]  NGE#:[id number]  Patient Care Team: Orpah Melter, MD as PCP - General (Family Medicine) Jayln Madeira, Virgie Dad, MD as Consulting Physician (Oncology) Chauncey Cruel, MD OTHER MD:  CHIEF COMPLAINT: metastatic cancer  CURRENT TREATMENT: workup in process   INTERVAL HISTORY: Samantha Lozano returns today for follow-up and treatment of her metastatic cancer accompanied.  Since her last visit here she had a virtual visit [facility], with Dr. Caleen Jobs and he recommended Doxil for her sarcoma.  I do not yet have a copy of his dictation, but his instructions to the family were very clear and conveyed to me by the patient's daughter-in-law [name].  We will discuss that option today  Note she had an echocardiogram 03/19/2018 which showed an ejection fraction in the 60% range.  She received palliative radiation as detailed below beginning 03/02/2020.  She tolerated this well.  Pain has been a major issue and we have been titrating her pain medicines as detailed below.  REVIEW OF SYSTEMS: Samantha Lozano thinks she may have had some relief from the palliative radiation, but still has significant pain in the right hip and right leg.  She cannot ambulate well because of this and she has a very hard time getting up from a sitting position as well.  She has significant nausea from the pain medication.  She has been taking Zofran for this with no significant relief.  She has not been constipated despite the narcotics.  She has been unable to drink as much as she would like.  Aside from this a detailed review of systems today was stable  HISTORY OF CURRENT ILLNESS: From the original intake note:  Samantha Lozano presented to the ED on 01/10/2020 with right-sided chest pain for one week. She described it as intermittent sharp pain, worse with deep breathing and certain movements, located near the  right breast. She underwent chest CT that day showing: innumerable bilateral pulmonary nodules concerning for metastatic disease; multiple lytic lesions involving bilateral ribs, proximal right humerus, and thoracic spine; probable nondisplaced pathologic fracture involving right lateral 5th rib; no acute cardiopulmonary process; no acute abnormality identified within upper abdomen.  She had bilateral screening mammography at [facility] on 05/26/2019 showing the breast density to be category C.  There was a 1.4 cm group of calcifications in the lower medial quadrant of the right breast which appeared changed.  She was brought back on 05/29/2019 for unilateral diagnostic right mammography which showed only benign scattered calcifications and benign vascular calcifications in the right breast.  There was no evidence of malignancy.  The patient's subsequent history is as detailed below.   PAST MEDICAL HISTORY: Past Medical History:  Diagnosis Date   Diabetes mellitus, type 2 ([place])    HLD (hyperlipidemia)    HTN (hypertension)    Leg edema    Other specified disorders of thyroid     PAST SURGICAL HISTORY: Past Surgical History:  Procedure Laterality Date   ABDOMINAL HYSTERECTOMY     THYROID SURGERY      FAMILY HISTORY: Family History  Problem Relation Age of Onset   Diabetes Mother    Hypertension Mother    Cancer Mother    Obesity Mother    Hypertension Father    Cancer Father    Obesity Father   The patient's father died at age 72 with cancer but the patient does not know what type.  He had many siblings and many of them had cancer but she does not know what types they may have been.  The patient's mother died at age 72 from what sounds like an anaplastic lymphoma.  The patient herself had 7 siblings.  One half sister died from ovarian cancer in her 65s.  1 brother died from pancreatic cancer age 62.   GYNECOLOGIC HISTORY:  No LMP recorded. Patient has had a  hysterectomy. Menarche: 74 years old Age at first live birth: 74 years old GX P 1 LMP 32s? HRT no  Hysterectomy? Yes, 09/2007, benign pathology BSO? 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  She is widowed, lives by herself, with no pets.  Son Samantha Lozano runs a water purification program.  His wife, [name], works in the [employer] at [employer] long.  The patient is not a church attender.    ADVANCED DIRECTIVES: Son Samantha Lozano and daughter-in-law [name] are jointly healthcare powers of attorney   HEALTH MAINTENANCE: Social History   Tobacco Use   Smoking status: Never Smoker   Smokeless tobacco: Never Used  Substance Use Topics   Alcohol use: Not Currently   Drug use: Not Currently     Colonoscopy: 03/2019  PAP: 06/2013, negative  Bone density:    Allergies  Allergen Reactions   Lisinopril Cough    Current Outpatient Medications  Medication Sig Dispense Refill   acetaminophen (TYLENOL) 500 MG tablet Take 1 tablet (500 mg total) by mouth in the morning, at noon, and at bedtime. Take with aleve 220 mg 120 tablet 0   aspirin EC 81 MG tablet Take 81 mg by mouth daily.     bisacodyl (DULCOLAX) 10 MG suppository Place 1 suppository (10 mg total) rectally daily as needed for moderate constipation. 12 suppository 0   carvedilol (COREG) 25 MG tablet Take 25 mg by mouth 2 (two) times daily with a meal.     glucose blood test strip Check BS twice daily 100 each 0   hydrochlorothiazide (HYDRODIURIL) 25 MG tablet Take 25 mg by mouth daily.     levothyroxine (SYNTHROID, LEVOTHROID) 125 MCG tablet Take 125 mcg by mouth daily before breakfast.     lovastatin (MEVACOR) 40 MG tablet Take 40 mg by mouth at bedtime.     metFORMIN (GLUCOPHAGE) 850 MG tablet Take 1 tablet (850 mg total) by mouth daily. with food 30 tablet 0   methocarbamol (ROBAXIN) 500 MG tablet Take 2 tablets (1,000 mg total) by  mouth 4 (four) times daily. 240 tablet 0   morphine (MS CONTIN) 60 MG 12 hr tablet Take 2 tablets (120 mg total) by mouth every 12 (twelve) hours. 120 tablet 0   morphine (MS CONTIN) 60 MG 12 hr tablet Take 2 tablets (120 mg total) by mouth every 12 (twelve) hours. 120 tablet 0   morphine (MSIR) 30 MG tablet Take 1 tablet (30 mg total) by mouth every 3 (three) hours as needed for severe pain. 120 tablet 0   morphine (MSIR) 30 MG tablet Take 1 tablet (30 mg total) by mouth every 4 (four) hours as needed for severe pain. 60 tablet 0   naproxen sodium (ALEVE) 220 MG tablet Take 1 tablet (220 mg total) by mouth in the morning, at noon, and at bedtime. Take with tylenol 500 mg 120 tablet 0   Omega-3 Fatty Acids (FISH OIL) 435 MG CAPS Take 435 mg by mouth daily at  2 PM.      ondansetron (ZOFRAN) 8 MG tablet Take 1 tablet (8 mg total) by mouth every 8 (eight) hours as needed for nausea or vomiting. 90 tablet 1   polyethylene glycol (MIRALAX / GLYCOLAX) 17 g packet Take 17 g by mouth 2 (two) times daily. 60 each 1   prochlorperazine (COMPAZINE) 10 MG tablet Take 1 tablet (10 mg total) by mouth every 6 (six) hours as needed for nausea or vomiting. 60 tablet 0   promethazine (PHENERGAN) 25 MG suppository Place 1 suppository (25 mg total) rectally every 6 (six) hours as needed for nausea or vomiting. 12 each 0   promethazine (PHENERGAN) 25 MG suppository Place 1 suppository (25 mg total) rectally every 6 (six) hours as needed for nausea or vomiting. 12 each 0   senna (SENOKOT) 8.6 MG TABS tablet Take 2 tablets (17.2 mg total) by mouth 2 (two) times daily. 120 tablet 1   venlafaxine XR (EFFEXOR-XR) 75 MG 24 hr capsule Take 1 capsule (75 mg total) by mouth daily with breakfast. 60 capsule 4   No current facility-administered medications for this visit.    OBJECTIVE: White woman examined in a wheelchair  Vitals:   03/25/20 0915 03/25/20 0916  BP: (!) 183/96 (!) 190/91  Pulse: 63   Resp: 16    Temp: 98.7 F (37.1 C)   SpO2: 97%    Wt Readings from Last 3 Encounters:  03/25/20 211 lb 14.4 oz (96.1 kg)  03/03/20 220 lb (99.8 kg)  02/28/20 224 lb 10.4 oz (101.9 kg)   Body mass index is 38.76 kg/m.    ECOG FS:2 - Symptomatic, <50% confined to bed  Ocular: Sclerae unicteric Ear-nose-throat: Wearing a mask Lymphatic: No cervical or supraclavicular adenopathy Lungs no rales or rhonchi Heart regular rate and rhythm Abd soft, obese, nontender MSK no focal spinal tenderness, grade 1 right lower extremity edema, without erythema Neuro: non-focal, right lower extremity deficit is likely due to pain rather than muscle weakness as such otherwise well-oriented, appropriate affect Breasts: Deferred    LAB RESULTS:  CMP     Component Value Date/Time   NA 137 03/25/2020 0806   NA 141 11/27/2018 1027   K 3.5 03/25/2020 0806   CL 96 (L) 03/25/2020 0806   CO2 31 03/25/2020 0806   GLUCOSE 176 (H) 03/25/2020 0806   BUN 10 03/25/2020 0806   BUN 19 11/27/2018 1027   CREATININE 0.79 03/25/2020 0806   CALCIUM 9.6 03/25/2020 0806   PROT 7.1 03/25/2020 0806   PROT 7.5 11/27/2018 1027   ALBUMIN 3.6 03/25/2020 0806   ALBUMIN 4.6 11/27/2018 1027   AST 21 03/25/2020 0806   ALT 22 03/25/2020 0806   ALKPHOS 85 03/25/2020 0806   BILITOT 0.6 03/25/2020 0806   GFRNONAA >60 03/25/2020 0806   GFRAA >60 03/25/2020 0806    No results found for: TOTALPROTELP, ALBUMINELP, A1GS, A2GS, BETS, BETA2SER, GAMS, MSPIKE, SPEI  Lab Results  Component Value Date   WBC 3.9 (L) 03/25/2020   NEUTROABS 2.7 03/25/2020   HGB 12.7 03/25/2020   HCT 37.2 03/25/2020   MCV 82.3 03/25/2020   PLT 117 (L) 03/25/2020    No results found for: LABCA2  No components found for: [id number]  No results for input(s): INR in the last 168 hours.  No results found for: [facility]  Lab Results  Component Value Date   ZCH885 14 01/13/2020    No results found for: OYD741  No results found for: OIN867  Lab  Results  Component Value Date   CA2729 27.5 01/13/2020    No components found for: HGQUANT  Lab Results  Component Value Date   CEA1 <1.00 01/13/2020   /  CEA ([facility])  Date Value Ref Range Status  01/13/2020 <1.00 0.00 - 5.00 ng/mL Final    Comment:    (NOTE) This test was performed using Architect's Chemiluminescent Microparticle Immunoassay. Values obtained from different assay methods cannot be used interchangeably. Please note that 5-10% of patients who smoke may see CEA levels up to 6.9 ng/mL. Performed at [facility], [place] [address] [place], [place] [address]      No results found for: AFPTUMOR  No results found for: CHROMOGRNA  No results found for: KPAFRELGTCHN, LAMBDASER, KAPLAMBRATIO (kappa/lambda light chains)  No results found for: HGBA, HGBA2QUANT, HGBFQUANT, HGBSQUAN (Hemoglobinopathy evaluation)   Lab Results  Component Value Date   LDH 223 (H) 01/13/2020    Lab Results  Component Value Date   IRON 48 01/13/2020   TIBC 321 01/13/2020   IRONPCTSAT 15 (L) 01/13/2020   (Iron and TIBC)  Lab Results  Component Value Date   FERRITIN 63 01/13/2020    Urinalysis    Component Value Date/Time   COLORURINE YELLOW 03/03/2020 1842   APPEARANCEUR CLEAR 03/03/2020 1842   LABSPEC 1.020 03/03/2020 1842   PHURINE 5.0 03/03/2020 1842   GLUCOSEU NEGATIVE 03/03/2020 1842   HGBUR SMALL (A) 03/03/2020 1842   BILIRUBINUR NEGATIVE 03/03/2020 1842   KETONESUR NEGATIVE 03/03/2020 1842   PROTEINUR NEGATIVE 03/03/2020 1842   NITRITE NEGATIVE 03/03/2020 1842   LEUKOCYTESUR TRACE (A) 03/03/2020 1842     STUDIES: DG Thoracic Spine 4V  Result Date: 02/26/2020 CLINICAL DATA:  Known metastatic disease. Unknown primary. Sudden onset of mid to low back pain extending into the right lower extremity. EXAM: THORACIC SPINE - 4+ VIEW COMPARISON:  CT of the chest 01/10/2020 FINDINGS: 12 rib-bearing thoracic type vertebral bodies are  present. Progressive diffuse pulmonary metastases are noted. Vertebral body heights maintained. No acute or healing fractures are present. Degenerative endplate changes are noted throughout the thoracic spine. Sternum and visualized ribs are within normal limits. Degenerative changes are present the lower cervical spine as well. IMPRESSION: 1. Progressive diffuse pulmonary metastases. 2. No acute or healing fractures. 3. Degenerative changes throughout the thoracic and cervical spine. Electronically Signed   By: San Morelle M.D.   On: 02/26/2020 09:28   DG Lumbar Spine Complete  Result Date: 02/26/2020 CLINICAL DATA:  Severe right sided mid back pain and leg pain beginning at 1 a.m. Known metastatic disease the bone. EXAM: LUMBAR SPINE - COMPLETE 4+ VIEW COMPARISON:  PET scan 02/05/2020. FINDINGS: Five non rib-bearing lumbar type vertebral bodies are present. Vertebral body heights are maintained. No acute or healing fractures are present. No significant listhesis is present. Advanced degenerative changes are noted in the lower lumbar facets. Metastatic disease is less apparent by plain film radiographs. Soft tissues are unremarkable. Atherosclerotic calcifications are present. IMPRESSION: 1. Advanced degenerative changes in the lower lumbar facets without acute or healing fractures. 2. Metastatic disease is less apparent by plain film radiographs. Electronically Signed   By: San Morelle M.D.   On: 02/26/2020 09:25   DG Tibia/Fibula Right  Result Date: 02/26/2020 CLINICAL DATA:  Back pain radiating into the right lower leg. No known injury. EXAM: RIGHT TIBIA AND FIBULA - 2 VIEW COMPARISON:  None. FINDINGS: There is no acute bony or joint abnormality. Osteoarthritis about the knee  is most severe in the patellofemoral compartment. Degenerative change at the tibiotalar joint and articulations of the midfoot is also identified. Calcaneal spurring is present. Soft tissues are unremarkable.  IMPRESSION: No acute abnormality. Multifocal osteoarthritis. Electronically Signed   By: Inge Rise M.D.   On: 02/26/2020 09:24   MR HIP RIGHT W WO CONTRAST  Result Date: 02/26/2020 CLINICAL DATA:  Severe right mid back pain radiating into the right leg today. No acute injury. Metastatic carcinoma of unknown primary. EXAM: MRI OF THE RIGHT HIP WITHOUT AND WITH CONTRAST TECHNIQUE: Multiplanar, multisequence MR imaging was performed both before and after administration of intravenous contrast. CONTRAST:  87m GADAVIST GADOBUTROL 1 MMOL/ML IV SOLN COMPARISON:  PET-CT 02/05/2020. FINDINGS: Despite efforts by the technologist and patient, mild motion artifact is present on today's exam and could not be eliminated. This reduces exam sensitivity and specificity. Bones: There is widespread osseous metastatic disease which appears progressive compared with previous PET-CT. Largest lesions include a 3.3 cm in the subtrochanteric region of the proximal right femur (image 14/6) and a 2.1 cm lesion near the right anterior inferior iliac spine (image 5/6). Additional lesions are present throughout the bony pelvis, including bilateral sacral lesions and prominent involvement of the L5 vertebral body on the right. There is a large lesion involving the right iliac crest with extraosseous soft tissue extension, measuring up to 4.8 cm on image 10/4. There is a small lesion in the proximal left femoral diaphysis. No definite pathologic fracture at this time. Articular cartilage and labrum Articular cartilage: No focal chondral defect or subchondral signal abnormality identified. Labrum: There is no gross labral tear or paralabral abnormality. Joint or bursal effusion Joint effusion: No significant hip joint effusion. Bursae: No focal periarticular fluid collection. Muscles and tendons Muscles and tendons: Partial tear of the left common hamstring tendon. The gluteus and iliopsoas tendons appear intact. The location of the  lesion at the right anterior inferior iliac spine predisposes to avulsive injury of the rectus femoris tendon. Other findings Miscellaneous: 1.7 cm subcutaneous metastasis noted superficial to the right tensor fascia lata muscle on image 5/6. IMPRESSION: 1. Progressive widespread osseous metastatic disease throughout the bony pelvis and proximal right femur. No definite pathologic fracture at this time. 2. Large lesion involving the right iliac CREST with extraosseous soft tissue extension. The location of the lesion at the right anterior inferior iliac spine predisposes to avulsive injury of the rectus femoris tendon. 3. 1.7 cm subcutaneous metastasis superficial to the right tensor fascia lata muscle. 4. Partial tear of the left common hamstring tendon. Electronically Signed   By: WRichardean SaleM.D.   On: 02/26/2020 15:16     ELIGIBLE FOR AVAILABLE RESEARCH PROTOCOL: no  ASSESSMENT: 74[facility] 01[facility]2020 with pleuritic chest pain, chest CT scan same day showing innumerable bilateral pulmonary nodules, largest in the superior left lower lobe measuring 2.5 cm largest on the right side in the lower lobe at 1.9 cm.  Scan also showed lytic lesions at T12, multiple ribs, and right humeral head  (a) CEA, CA 19-9, CA 27-29 all within normal limits 01/13/2020  (b) bone marrow biopsy 01/15/2020 nondiagnostic  (c) right breast mammography and ultrasonography led to biopsy [id number] [phone], all benign  (d) head CT with and without contrast 01/21/2020 no evidence of brain metastases  (e) PET scan 02/05/2020 shows multiple lung lesions, a soft tissue mass in the lower left back adjacent to the 12th rib, no abnormal liver activity, no hypermetabolic activity  in the colon, pancreas, spleen, or adrenals, expansile lytic lesion in the medial right iliac bone measuring 3.5 cm with SUV of 4.0 and a smaller lytic lesion in the right iliac bone adjacent to the acetabulum measuring 1.5 cm with an SUV max of  4.  (f) CT biopsy obtained 02/17/2020 from the paraspinal mass shows ([id number] [phone]) spindle cell malignancy consistent with leiomyosarcoma, low-grade, stage IV (0 necrosis, 1 differentiation, 2 mitotic rate = 3)  (1) palliative radiation to T12, right iliac region, and right proximal femur started 03/02/2020  (2) liposomal doxorubicin/Doxil to start 03/31/2020, repeat every 28 days  (a) echocardiogram  (b) restaging chest CT  (3) supportive care: pain management (updated 03/25/2020)  (a) tylenol 500 mg/ naproxen 220 mg TID  (b) MSContin 60 mg BID-- increase to 120 md BID depending on results  (c) MSIR 30 mg Q4h PRN  (d) bowel prophylaxis   PLAN: We discussed her diagnosis again and she also discussed it with Dr.Reidel through their virtual visit.  They understand the purpose of treatment is control of the disease and not cure.  The options were for treatment versus hospice.  She thinks she will be able to tolerate Doxil and we did discuss the possible toxicities side effects and complications of this agent including concerns regarding heart muscle weakness, mouth sores and mucositis, palmar plantar erythrodysesthesia, neuropathy, low blood counts, hair loss, nausea, fatigue, and other possible complications.  In my experience however many patients tolerate Doxil quite well and I think this is a good choice for her.  I do believe she will be able to tolerate it.  I have sent orders for a repeat CT of the chest to give [name] a new baseline and an echocardiogram before we start.  Tentative starting date 03/31/2020.  We will repeat this every 28 days and repeat a CT scan after the first 2 cycles  We discussed pain management and they are going to try Tylenol 500 mg with Aleve 220 mg 3 times a day as baseline.  They will continue the MS Contin as we discussed today.  They are going to stop oxycodone or Percocet and use MSIR instead for breakthrough pain.  Her nausea has not been well controlled.  I  wrote her for promethazine suppositories and Compazine and of course they also have the Zofran.  She is so far having no problems with constipation  I have scheduled a virtual visit in about a week after first Doxil dose to make sure everything is well.  She will then see [name] again on 819 for cycle #2 and I will see her again on September 16 for cycle #3 and before that visit she will have a repeat CT of the chest.  They know to call for any other issue that may develop before the next visit.  Total encounter time 40 minutes.Sarajane Jews C. Shawntez Dickison, MD 03/25/2020 1:43 PM Medical Oncology and Hematology [facility] [place], [place] [address] Tel. [phone]    Fax. [phone]   I, Jacqualyn Posey am acting as a Education administrator for Chauncey Cruel, MD.   I, Lurline Del MD, have reviewed the above documentation for accuracy and completeness, and I agree with the above.    *Total Encounter Time as defined by the Centers for Medicare and Medicaid Services includes, in addition to the face-to-face time of a patient visit (documented in the note above) non-face-to-face time: obtaining and reviewing outside history, ordering and  reviewing medications, tests or procedures, care coordination (communications with other health care professionals or caregivers) and documentation in the medical record.

## 2020-03-24 NOTE — Progress Notes (Signed)
Pt's daughter, [name] called stating her mother is not able to hold down pain medication or food. Spoke with Dr Jana Hakim who ordered Phenergan suppository 25 mg Q6H PRN nausea. Pt will be seen in clinic tomorrow, CBC, CMP ordered and pt will need appt for infusion for IVF, NS.

## 2020-03-25 ENCOUNTER — Inpatient Hospital Stay: Payer: PPO

## 2020-03-25 ENCOUNTER — Ambulatory Visit: Payer: PPO

## 2020-03-25 ENCOUNTER — Other Ambulatory Visit: Payer: Self-pay

## 2020-03-25 ENCOUNTER — Other Ambulatory Visit: Payer: PPO | Admitting: *Deleted

## 2020-03-25 ENCOUNTER — Inpatient Hospital Stay: Payer: PPO | Attending: Oncology | Admitting: Oncology

## 2020-03-25 VITALS — BP 190/91 | HR 63 | Temp 98.7°F | Resp 16 | Ht 62.0 in | Wt 211.9 lb

## 2020-03-25 VITALS — BP 190/90 | HR 75

## 2020-03-25 VITALS — BP 188/112 | HR 86 | Temp 97.6°F | Resp 20

## 2020-03-25 DIAGNOSIS — C7800 Secondary malignant neoplasm of unspecified lung: Secondary | ICD-10-CM | POA: Diagnosis not present

## 2020-03-25 DIAGNOSIS — C499 Malignant neoplasm of connective and soft tissue, unspecified: Secondary | ICD-10-CM

## 2020-03-25 DIAGNOSIS — C801 Malignant (primary) neoplasm, unspecified: Secondary | ICD-10-CM | POA: Insufficient documentation

## 2020-03-25 DIAGNOSIS — E860 Dehydration: Secondary | ICD-10-CM

## 2020-03-25 DIAGNOSIS — R112 Nausea with vomiting, unspecified: Secondary | ICD-10-CM

## 2020-03-25 DIAGNOSIS — Z79899 Other long term (current) drug therapy: Secondary | ICD-10-CM | POA: Insufficient documentation

## 2020-03-25 DIAGNOSIS — C7951 Secondary malignant neoplasm of bone: Secondary | ICD-10-CM | POA: Diagnosis not present

## 2020-03-25 DIAGNOSIS — Z7189 Other specified counseling: Secondary | ICD-10-CM

## 2020-03-25 DIAGNOSIS — Z515 Encounter for palliative care: Secondary | ICD-10-CM

## 2020-03-25 LAB — CBC WITH DIFFERENTIAL (CANCER CENTER ONLY)
Abs Immature Granulocytes: 0.01 10*3/uL (ref 0.00–0.07)
Basophils Absolute: 0 10*3/uL (ref 0.0–0.1)
Basophils Relative: 1 %
Eosinophils Absolute: 0.3 10*3/uL (ref 0.0–0.5)
Eosinophils Relative: 7 %
HCT: 37.2 % (ref 36.0–46.0)
Hemoglobin: 12.7 g/dL (ref 12.0–15.0)
Immature Granulocytes: 0 %
Lymphocytes Relative: 13 %
Lymphs Abs: 0.5 10*3/uL — ABNORMAL LOW (ref 0.7–4.0)
MCH: 28.1 pg (ref 26.0–34.0)
MCHC: 34.1 g/dL (ref 30.0–36.0)
MCV: 82.3 fL (ref 80.0–100.0)
Monocytes Absolute: 0.4 10*3/uL (ref 0.1–1.0)
Monocytes Relative: 9 %
Neutro Abs: 2.7 10*3/uL (ref 1.7–7.7)
Neutrophils Relative %: 70 %
Platelet Count: 117 10*3/uL — ABNORMAL LOW (ref 150–400)
RBC: 4.52 MIL/uL (ref 3.87–5.11)
RDW: 12.4 % (ref 11.5–15.5)
WBC Count: 3.9 10*3/uL — ABNORMAL LOW (ref 4.0–10.5)
nRBC: 0 % (ref 0.0–0.2)

## 2020-03-25 LAB — CMP (CANCER CENTER ONLY)
ALT: 22 U/L (ref 0–44)
AST: 21 U/L (ref 15–41)
Albumin: 3.6 g/dL (ref 3.5–5.0)
Alkaline Phosphatase: 85 U/L (ref 38–126)
Anion gap: 10 (ref 5–15)
BUN: 10 mg/dL (ref 8–23)
CO2: 31 mmol/L (ref 22–32)
Calcium: 9.6 mg/dL (ref 8.9–10.3)
Chloride: 96 mmol/L — ABNORMAL LOW (ref 98–111)
Creatinine: 0.79 mg/dL (ref 0.44–1.00)
GFR, Est AFR Am: >60 mL/min (ref >60)
GFR, Estimated: >60 mL/min (ref >60)
Glucose, Bld: 176 mg/dL — ABNORMAL HIGH (ref 70–99)
Potassium: 3.5 mmol/L (ref 3.5–5.1)
Sodium: 137 mmol/L (ref 135–145)
Total Bilirubin: 0.6 mg/dL (ref 0.3–1.2)
Total Protein: 7.1 g/dL (ref 6.5–8.1)

## 2020-03-25 MED ORDER — NAPROXEN SODIUM 220 MG PO TABS
220.0000 mg | ORAL_TABLET | Freq: Three times a day (TID) | ORAL | 0 refills | Status: DC
Start: 2020-03-25 — End: 2020-05-05

## 2020-03-25 MED ORDER — PROCHLORPERAZINE EDISYLATE 10 MG/2ML IJ SOLN
INTRAMUSCULAR | Status: AC
  Filled 2020-03-25: qty 2

## 2020-03-25 MED ORDER — PROCHLORPERAZINE MALEATE 10 MG PO TABS
10.0000 mg | ORAL_TABLET | Freq: Four times a day (QID) | ORAL | 0 refills | Status: AC | PRN
Start: 2020-03-25 — End: ?

## 2020-03-25 MED ORDER — SODIUM CHLORIDE 0.9 % IV SOLN
Freq: Once | INTRAVENOUS | Status: AC
  Filled 2020-03-25: qty 250

## 2020-03-25 MED ORDER — MORPHINE SULFATE 30 MG PO TABS: 30.0000 mg | ORAL_TABLET | ORAL | 0 refills | Status: DC | PRN

## 2020-03-25 MED ORDER — ACETAMINOPHEN 500 MG PO TABS
500.0000 mg | ORAL_TABLET | Freq: Three times a day (TID) | ORAL | 0 refills | Status: AC
Start: 2020-03-25 — End: ?

## 2020-03-25 MED ORDER — PROMETHAZINE HCL 25 MG RE SUPP: 25.0000 mg | Freq: Four times a day (QID) | RECTAL | 0 refills | Status: AC | PRN

## 2020-03-25 MED ORDER — PROCHLORPERAZINE EDISYLATE 10 MG/2ML IJ SOLN
10.0000 mg | Freq: Once | INTRAMUSCULAR | Status: AC
  Administered 2020-03-25: 10 mg via INTRAVENOUS

## 2020-03-25 MED ORDER — VENLAFAXINE HCL ER 75 MG PO CP24
75.0000 mg | ORAL_CAPSULE | Freq: Every day | ORAL | 4 refills | Status: DC
Start: 2020-03-25 — End: 2020-04-18

## 2020-03-25 MED ORDER — MORPHINE SULFATE ER 60 MG PO TBCR: 120.0000 mg | EXTENDED_RELEASE_TABLET | Freq: Two times a day (BID) | ORAL | 0 refills | Status: DC

## 2020-03-25 NOTE — Progress Notes (Signed)
Pharmacist Chemotherapy Monitoring - Initial Assessment    Anticipated start date: 03/31/18   Regimen:   Are orders appropriate based on the patients diagnosis, regimen, and cycle? Yes  Does the plan date match the patients scheduled date? Yes  Is the sequencing of drugs appropriate? Yes  Are the premedications appropriate for the patients regimen? Yes  Prior Authorization for treatment is: Pending o If applicable, is the correct biosimilar selected based on the patient's insurance? not applicable  Organ Function and Labs:  Are dose adjustments needed based on the patient's renal function, hepatic function, or hematologic function? No  Are appropriate labs ordered prior to the start of patient's treatment? Yes  Other organ system assessment, if indicated: doxorubicin liposomal: Echo/ MUGA  The following baseline labs, if indicated, have been ordered: N/A  Dose Assessment:  Are the drug doses appropriate? Yes  Are the following correct: o Drug concentrations Yes o IV fluid compatible with drug Yes o Administration routes Yes o Timing of therapy Yes  If applicable, does the patient have documented access for treatment and/or plans for port-a-cath placement? no  If applicable, have lifetime cumulative doses been properly documented and assessed? yes Lifetime Dose Tracking  No doses have been documented on this patient for the following tracked chemicals: Doxorubicin, Epirubicin, Idarubicin, Daunorubicin, Mitoxantrone, Bleomycin, Oxaliplatin, Carboplatin, Liposomal Doxorubicin  o   Toxicity Monitoring/Prevention:  The patient has the following take home antiemetics prescribed: Ondansetron and Prochlorperazine  The patient has the following take home medications prescribed: N/A  Medication allergies and previous infusion related reactions, if applicable, have been reviewed and addressed. Yes  The patient's current medication list has been assessed for drug-drug  interactions with their chemotherapy regimen. no significant drug-drug interactions were identified on review.  Order Review:  Are the treatment plan orders signed? No  Is the patient scheduled to see a provider prior to their treatment? No  I verify that I have reviewed each item in the above checklist and answered each question accordingly.  Romualdo Bolk [facility] 03/25/2020 10:35 AM

## 2020-03-25 NOTE — Patient Instructions (Signed)

## 2020-03-28 ENCOUNTER — Other Ambulatory Visit: Payer: Self-pay | Admitting: Oncology

## 2020-03-28 ENCOUNTER — Telehealth: Payer: Self-pay

## 2020-03-28 ENCOUNTER — Ambulatory Visit: Payer: PPO

## 2020-03-28 NOTE — Telephone Encounter (Signed)
Called Mrs. Koble and spoke to her daughter in law [name] about possibly moving her appointment to a different time on 7/22.  [name] reported that Mrs. Totten cannot even get out of bed right now and she wanted to know if she could put off chemo.  I told her I would put in a note to Dr. Jana Hakim and Val to call her and discuss this with her.  Daughter in law will be expecting the call.  Her appt is scheduled on 7/22 at 845 but that is not a good time for infusion as we do not have a slot.  If she chooses to come this week, please put in a scheduling message to move her to 1200 labs and 1300 infusion.  [name] is aware of this time change if she chooses to come this week.  Gardiner Rhyme, RN

## 2020-03-28 NOTE — Progress Notes (Signed)
I called birth and spoke with her daughter-in-law [name] who is currently her primary caregiver.  Per that is pretty much staying in bed.  She is using a bedside commode.  She sleeps most of the time.  Pain is much better and she is now pretty much off narcotics, taking nonsteroidals and Tylenol.  [name] says she had an enormous bowel movement today which is likely due to the earlier constipation.  The concern is that the patient feels too weak to get in a car and come here to get treated or relieving get out of bed.  We left it that the initial goals here are to get the patient out of bed more than half the day even if it is to a recliner and to try to drink at least a quart of liquid daily.  If she cannot do that then probably we need to move towards a hospice referral.

## 2020-03-29 ENCOUNTER — Telehealth: Payer: Self-pay | Admitting: *Deleted

## 2020-03-29 ENCOUNTER — Ambulatory Visit: Payer: PPO

## 2020-03-29 NOTE — Telephone Encounter (Signed)
This RN called [facility] per MD phone discussion with the pt's son- [name] - for full hospice services.  Above information given to [facility] with concern that pt is having significant decline and need for visit ASAP.  Note pt has been under palliative care with [facility]

## 2020-03-30 ENCOUNTER — Encounter: Payer: Self-pay | Admitting: Oncology

## 2020-03-30 ENCOUNTER — Telehealth: Payer: Self-pay | Admitting: *Deleted

## 2020-03-30 ENCOUNTER — Telehealth (HOSPITAL_COMMUNITY): Payer: Self-pay | Admitting: Radiology

## 2020-03-30 ENCOUNTER — Ambulatory Visit: Payer: PPO

## 2020-03-30 NOTE — Telephone Encounter (Signed)
Called and spoke with patient's daughter-in-law [name], to follow up on how patient has been doing since my visit 7/16. She reports that patient has not been doing well and has decided not to pursue chemotherapy. They have a hospice admission visit scheduled for today. She is appreciative of call.

## 2020-03-30 NOTE — Telephone Encounter (Signed)
Called patient to see if patient could come in tomorrow for echocardiogram. Unable to reach, left message on answering machine.

## 2020-03-31 ENCOUNTER — Ambulatory Visit: Payer: PPO

## 2020-03-31 ENCOUNTER — Inpatient Hospital Stay: Payer: PPO

## 2020-03-31 ENCOUNTER — Telehealth (HOSPITAL_COMMUNITY): Payer: Self-pay | Admitting: Radiology

## 2020-03-31 ENCOUNTER — Other Ambulatory Visit: Payer: Self-pay | Admitting: Oncology

## 2020-03-31 ENCOUNTER — Other Ambulatory Visit: Payer: Self-pay | Admitting: *Deleted

## 2020-03-31 MED ORDER — LORAZEPAM 2 MG/ML PO CONC: 0.6000 mg | Freq: Four times a day (QID) | ORAL | 0 refills | Status: AC | PRN

## 2020-03-31 MED ORDER — ONDANSETRON 8 MG PO TBDP: 8.0000 mg | ORAL_TABLET | Freq: Three times a day (TID) | ORAL | 0 refills | Status: AC | PRN

## 2020-03-31 MED ORDER — FENTANYL 25 MCG/HR TD PT72: 1.0000 | MEDICATED_PATCH | TRANSDERMAL | 0 refills | Status: DC

## 2020-03-31 NOTE — Progress Notes (Signed)
COMMUNITY PALLIATIVE CARE RN NOTE  PATIENT NAME: Samantha Lozano DOB: [birth year] MRN: [id number]  PRIMARY CARE PROVIDER: Orpah Melter, MD  RESPONSIBLE PARTY: Tonny Branch (daughter-in-law) Acct ID - Guarantor Home Phone Work Phone Relationship Acct Type  [id number] CINA, [phone]  Self P/F     [place], [address], [place] [id number]   Covid-19 Pre-screening Negative  PLAN OF CARE and INTERVENTION:  1. ADVANCE CARE PLANNING/GOALS OF CARE: Goal is for patient to remain in her home and avoid hospitalizations. She has a DNR. 2. PATIENT/CAREGIVER EDUCATION: Explained Palliative care services, Symptom management 3. DISEASE STATUS: Met with patient, her son and daughter-in-law in patient's home. Upon arrival, she is ambulating into the living room using her rollator walker and appears very weak and tired. She is leaning forward with an emesis basin present on the walker. [name] was able to provide patient's health history. Patient reports moderate to severe generalized pain, but is more evident in her right hip and leg. She just completed 10 Palliative radiation treatments on 7/12. She has also been experiencing ongoing issues with nausea and vomiting despite the use of Zofran. She was seen by Dr. Jana Hakim this morning and her MS Contin was increased to 120 mg BID and she has Oxycodone IR every 4 hours for breakthrough pain. Her Zofran was changed to Compazine today. She also received IV fluids for dehydration. She is scheduled for her 1st round of chemotherapy on 7/22. Unsure how patient will tolerate this. She is to have 6 treatments altogether, with a scan scheduled after every 2 rounds. Patient states that radiation was difficult for her and she was unable to be consistent with her radiation appointments because she would be too sick to go. She has a poor appetite d/t persistent nausea. She was able to eat a small cup of applesauce and peaches today. She requires assistance  with bathing and dressing. She is continent of both bowel and bladder. They gave verbal consent to receiving ongoing visit with Palliative care. Will continue to monitor.   HISTORY OF PRESENT ILLNESS: This is a 74 yo female with a diagnosis of of Metastatic Leiomyocarcoma. Palliative care team was asked to follow patient for additional support. Will follow up with patient in 1 week.   CODE STATUS: DNR ADVANCED DIRECTIVES: Y MOST FORM: no PPS: 40%   PHYSICAL EXAM:   VITALS: Today's Vitals   03/25/20 1443  BP: (!) 188/112  Pulse: 86  Resp: 20  Temp: 97.6 F (36.4 C)  TempSrc: Temporal  SpO2: 90%  PainSc: 6   PainLoc: Generalized    LUNGS: clear to auscultation  CARDIAC: Cor RRR EXTREMITIES: No edema SKIN: Exposed skin is dry and intact  NEURO: Alert and oriented x 3, increased generalized weakness, ambulatory w/walker   (Duration of visit and documentation 90 minutes)   Daryl Eastern, RN BSN

## 2020-03-31 NOTE — Telephone Encounter (Signed)
Samantha Lozano left a message cancelling the echocardiogram. She stated the patient doesn't need an echocardiogram at this time.

## 2020-04-01 ENCOUNTER — Ambulatory Visit: Payer: PPO

## 2020-04-04 ENCOUNTER — Other Ambulatory Visit: Payer: Self-pay | Admitting: Oncology

## 2020-04-04 ENCOUNTER — Ambulatory Visit: Payer: PPO

## 2020-04-04 NOTE — Progress Notes (Signed)
I called Ms. Samantha Lozano today and spoke with her daughter-in-law Samantha Lozano.  She tells me that Ms. Samantha Lozano finally agreed to take Phenergan suppositories and there has been no vomiting since Thursday, July 22.  She is taking morphine IR a little bit by mouth when necessary crushed in applesauce.  She is not taking anything else by mouth except a little bit to drink.  She is actually walking around and feeling a little bit better which is very good to hear.  We will continue to keep in touch and make sure that the patient symptoms are controlled.

## 2020-04-05 ENCOUNTER — Ambulatory Visit: Payer: PPO

## 2020-04-05 NOTE — Progress Notes (Signed)
[facility]  [phone] [phone]     ID: Samantha Lozano DOB: 1946/05/24  MR#: [id number]  KAJ#:[id number]  Patient Care Team: Samantha Melter, MD as PCP - General (Family Medicine) Samantha Lozano, Samantha Dad, MD as Consulting Physician (Oncology) Aurea Graff OTHER MD:  I connected with Samantha Lozano on 04/05/20 at  8:30 AM EDT by video enabled telemedicine visit and verified that I am speaking with the correct person using two identifiers.   I discussed the limitations, risks, security and privacy concerns of performing an evaluation and management service by telemedicine and the availability of in-person appointments. I also discussed with the patient that there may be a patient responsible charge related to this service. The patient expressed understanding and agreed to proceed.   Other persons participating in the visit and their role in the encounter: none  Patient's location: home  Provider's location: [place]: metastatic cancer  CURRENT TREATMENT: Comfort care under hospice   INTERVAL HISTORY: Samantha Lozano was contacted today for follow-up of her metastatic cancer.  She was in bed during our discussion.  She is now under the care of hospice, for comfort care.  We had discussed Doxil every 28 days as suggested by Dr. Angelina Lozano at [facility] but she decided against treatment   REVIEW OF SYSTEMS: Samantha Lozano has been having significant problems with pain in her left leg and significant nausea and vomiting.  Initially she did not want to take medication but her daughter-in-law Samantha Lozano has convinced her to take her antiemetics and she is now not vomiting.  She tells me she is drinking some and eating some.  She was out of bed all day yesterday but then her pain patch (fentanyl) fell off and she was again very uncomfortable.  She is now feeling better with the patch back on.  She is doing well with bowel movements also.   She knows to take MiraLAX and stool softeners as needed.   HISTORY OF CURRENT ILLNESS: From the original intake note:  Samantha Lozano presented to the ED on 01/10/2020 with right-sided chest pain for one week. She described it as intermittent sharp pain, worse with deep breathing and certain movements, located near the right breast. She underwent chest CT that day showing: innumerable bilateral pulmonary nodules concerning for metastatic disease; multiple lytic lesions involving bilateral ribs, proximal right humerus, and thoracic spine; probable nondisplaced pathologic fracture involving right lateral 5th rib; no acute cardiopulmonary process; no acute abnormality identified within upper abdomen.  She had bilateral screening mammography at [facility] on 05/26/2019 showing the breast density to be category C.  There was a 1.4 cm group of calcifications in the lower medial quadrant of the right breast which appeared changed.  She was brought back on 05/29/2019 for unilateral diagnostic right mammography which showed only benign scattered calcifications and benign vascular calcifications in the right breast.  There was no evidence of malignancy.  The patient's subsequent history is as detailed below.   PAST MEDICAL HISTORY: Past Medical History:  Diagnosis Date  . Diabetes mellitus, type 2 ([place])   . HLD (hyperlipidemia)   . HTN (hypertension)   . Leg edema   . Other specified disorders of thyroid     PAST SURGICAL HISTORY: Past Surgical History:  Procedure Laterality Date  . ABDOMINAL HYSTERECTOMY    . THYROID SURGERY      FAMILY HISTORY: Family History  Problem Relation Age of Onset  . Diabetes  Mother   . Hypertension Mother   . Cancer Mother   . Obesity Mother   . Hypertension Father   . Cancer Father   . Obesity Father   The patient's father died at age 90 with cancer but the patient does not know what type.  He had many siblings and many of them had cancer but she does not know  what types they may have been.  The patient's mother died at age 83 from what sounds like an anaplastic lymphoma.  The patient herself had 7 siblings.  One half sister died from ovarian cancer in her 81s.  1 brother died from pancreatic cancer age 48.   GYNECOLOGIC HISTORY:  No LMP recorded. Patient has had a hysterectomy. Menarche: 74 years old Age at first live birth: 74 years old GX P 1 LMP 61s? HRT no  Hysterectomy? Yes, 09/2007, benign pathology BSO? 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  She is widowed, lives by herself, with no pets.  Son Samantha Lozano runs a water purification program.  His wife, Samantha Lozano, works in the [place] at [employer] long.  The patient is not a church attender.    ADVANCED DIRECTIVES: Son Samantha Lozano and daughter-in-law Samantha Lozano are jointly healthcare powers of attorney   HEALTH MAINTENANCE: Social History   Tobacco Use  . Smoking status: Never Smoker  . Smokeless tobacco: Never Used  Substance Use Topics  . Alcohol use: Not Currently  . Drug use: Not Currently     Colonoscopy: 03/2019  PAP: 06/2013, negative  Bone density:    Allergies  Allergen Reactions  . Lisinopril Cough    Current Outpatient Medications  Medication Sig Dispense Refill  . acetaminophen (TYLENOL) 500 MG tablet Take 1 tablet (500 mg total) by mouth in the morning, at noon, and at bedtime. Take with aleve 220 mg 120 tablet 0  . aspirin EC 81 MG tablet Take 81 mg by mouth daily.    . bisacodyl (DULCOLAX) 10 MG suppository Place 1 suppository (10 mg total) rectally daily as needed for moderate constipation. 12 suppository 0  . carvedilol (COREG) 25 MG tablet Take 25 mg by mouth 2 (two) times daily with a meal.    . fentaNYL (DURAGESIC) 25 MCG/HR Place 1 patch onto the skin every 3 (three) days. 5 patch 0  . glucose blood test strip Check BS twice daily 100 each 0  . hydrochlorothiazide  (HYDRODIURIL) 25 MG tablet Take 25 mg by mouth daily.    [name] levothyroxine (SYNTHROID, LEVOTHROID) 125 MCG tablet Take 125 mcg by mouth daily before breakfast.    . LORazepam (ATIVAN) 2 MG/ML concentrated solution Take 0.3 mLs (0.6 mg total) by mouth every 6 (six) hours as needed for anxiety. 30 mL 0  . lovastatin (MEVACOR) 40 MG tablet Take 40 mg by mouth at bedtime.    . metFORMIN (GLUCOPHAGE) 850 MG tablet Take 1 tablet (850 mg total) by mouth daily. with food 30 tablet 0  . methocarbamol (ROBAXIN) 500 MG tablet Take 2 tablets (1,000 mg total) by mouth 4 (four) times daily. 240 tablet 0  . morphine (MS CONTIN) 60 MG 12 hr tablet Take 2 tablets (120 mg total) by mouth every 12 (twelve) hours. 120 tablet 0  . morphine (MS CONTIN) 60 MG 12 hr tablet Take 2 tablets (120 mg total) by mouth every 12 (twelve) hours. 120 tablet 0  . morphine (MSIR) 30 MG  tablet Take 1 tablet (30 mg total) by mouth every 3 (three) hours as needed for severe pain. 120 tablet 0  . morphine (MSIR) 30 MG tablet Take 1 tablet (30 mg total) by mouth every 4 (four) hours as needed for severe pain. 60 tablet 0  . naproxen sodium (ALEVE) 220 MG tablet Take 1 tablet (220 mg total) by mouth in the morning, at noon, and at bedtime. Take with tylenol 500 mg 120 tablet 0  . Omega-3 Fatty Acids (FISH OIL) 435 MG CAPS Take 435 mg by mouth daily at 2 PM.     . ondansetron (ZOFRAN ODT) 8 MG disintegrating tablet Take 1 tablet (8 mg total) by mouth every 8 (eight) hours as needed for nausea or vomiting (PRN 3 times daily dissolve under the tongue). 20 tablet 0  . polyethylene glycol (MIRALAX / GLYCOLAX) 17 g packet Take 17 g by mouth 2 (two) times daily. 60 each 1  . prochlorperazine (COMPAZINE) 10 MG tablet Take 1 tablet (10 mg total) by mouth every 6 (six) hours as needed for nausea or vomiting. 60 tablet 0  . promethazine (PHENERGAN) 25 MG suppository Place 1 suppository (25 mg total) rectally every 6 (six) hours as needed for nausea or  vomiting. 12 each 0  . promethazine (PHENERGAN) 25 MG suppository Place 1 suppository (25 mg total) rectally every 6 (six) hours as needed for nausea or vomiting. 12 each 0  . senna (SENOKOT) 8.6 MG TABS tablet Take 2 tablets (17.2 mg total) by mouth 2 (two) times daily. 120 tablet 1  . venlafaxine XR (EFFEXOR-XR) 75 MG 24 hr capsule Take 1 capsule (75 mg total) by mouth daily with breakfast. 60 capsule 4   No current facility-administered medications for this visit.    OBJECTIVE: White woman interviewed while in bed  There were no vitals filed for this visit. Wt Readings from Last 3 Encounters:  03/25/20 211 lb 14.4 oz (96.1 kg)  03/03/20 220 lb (99.8 kg)  02/28/20 224 lb 10.4 oz (101.9 kg)   There is no height or weight on file to calculate BMI.    ECOG FS:2 - Symptomatic, <50% confined to bed  Telemedicine visit 04/06/2020  LAB RESULTS:  CMP     Component Value Date/Time   NA 137 03/25/2020 0806   NA 141 11/27/2018 1027   K 3.5 03/25/2020 0806   CL 96 (L) 03/25/2020 0806   CO2 31 03/25/2020 0806   GLUCOSE 176 (H) 03/25/2020 0806   BUN 10 03/25/2020 0806   BUN 19 11/27/2018 1027   CREATININE 0.79 03/25/2020 0806   CALCIUM 9.6 03/25/2020 0806   PROT 7.1 03/25/2020 0806   PROT 7.5 11/27/2018 1027   ALBUMIN 3.6 03/25/2020 0806   ALBUMIN 4.6 11/27/2018 1027   AST 21 03/25/2020 0806   ALT 22 03/25/2020 0806   ALKPHOS 85 03/25/2020 0806   BILITOT 0.6 03/25/2020 0806   GFRNONAA >60 03/25/2020 0806   GFRAA >60 03/25/2020 0806    No results found for: TOTALPROTELP, ALBUMINELP, A1GS, A2GS, BETS, BETA2SER, GAMS, MSPIKE, SPEI  Lab Results  Component Value Date   WBC 3.9 (L) 03/25/2020   NEUTROABS 2.7 03/25/2020   HGB 12.7 03/25/2020   HCT 37.2 03/25/2020   MCV 82.3 03/25/2020   PLT 117 (L) 03/25/2020    No results found for: LABCA2  No components found for: EYCXKG818  No results for input(s): INR in the last 168 hours.  No results found for: LABCA2  Lab  Results  Component Value Date   NAT557 14 01/13/2020    No results found for: CAN125  No results found for: DUK025  Lab Results  Component Value Date   CA2729 27.5 01/13/2020    No components found for: HGQUANT  Lab Results  Component Value Date   CEA1 <1.00 01/13/2020   /  CEA ([place])  Date Value Ref Range Status  01/13/2020 <1.00 0.00 - 5.00 ng/mL Final    Comment:    (NOTE) This test was performed using Architect's Chemiluminescent Microparticle Immunoassay. Values obtained from different assay methods cannot be used interchangeably. Please note that 5-10% of patients who smoke may see CEA levels up to 6.9 ng/mL. Performed at [facility], [place] [address], [place], [place] [address]      No results found for: AFPTUMOR  No results found for: CHROMOGRNA  No results found for: KPAFRELGTCHN, LAMBDASER, KAPLAMBRATIO (kappa/lambda light chains)  No results found for: HGBA, HGBA2QUANT, HGBFQUANT, HGBSQUAN (Hemoglobinopathy evaluation)   Lab Results  Component Value Date   LDH 223 (H) 01/13/2020    Lab Results  Component Value Date   IRON 48 01/13/2020   TIBC 321 01/13/2020   IRONPCTSAT 15 (L) 01/13/2020   (Iron and TIBC)  Lab Results  Component Value Date   FERRITIN 63 01/13/2020    Urinalysis    Component Value Date/Time   COLORURINE YELLOW 03/03/2020 1842   APPEARANCEUR CLEAR 03/03/2020 1842   LABSPEC 1.020 03/03/2020 1842   PHURINE 5.0 03/03/2020 1842   GLUCOSEU NEGATIVE 03/03/2020 1842   HGBUR SMALL (A) 03/03/2020 1842   BILIRUBINUR NEGATIVE 03/03/2020 1842   KETONESUR NEGATIVE 03/03/2020 1842   PROTEINUR NEGATIVE 03/03/2020 1842   NITRITE NEGATIVE 03/03/2020 1842   LEUKOCYTESUR TRACE (A) 03/03/2020 1842    STUDIES: No results found.   ELIGIBLE FOR AVAILABLE RESEARCH PROTOCOL: no  ASSESSMENT: 74 y.o. [name] woman presenting 01/10/2020 with pleuritic chest pain, chest CT scan same day showing  innumerable bilateral pulmonary nodules, largest in the superior left lower lobe measuring 2.5 cm largest on the right side in the lower lobe at 1.9 cm.  Scan also showed lytic lesions at T12, multiple ribs, and right humeral head  (a) CEA, CA 19-9, CA 27-29 all within normal limits 01/13/2020  (b) bone marrow biopsy 01/15/2020 nondiagnostic  (c) right breast mammography and ultrasonography led to biopsy [id number], all benign  (d) head CT with and without contrast 01/21/2020 no evidence of brain metastases  (e) PET scan 02/05/2020 shows multiple lung lesions, a soft tissue mass in the lower left back adjacent to the 12th rib, no abnormal liver activity, no hypermetabolic activity in the colon, pancreas, spleen, or adrenals, expansile lytic lesion in the medial right iliac bone measuring 3.5 cm with SUV of 4.0 and a smaller lytic lesion in the right iliac bone adjacent to the acetabulum measuring 1.5 cm with an SUV max of 4.  (f) CT biopsy obtained 02/17/2020 from the paraspinal mass shows (WS-2 [phone]) spindle cell malignancy consistent with leiomyosarcoma, low-grade, stage IV (0 necrosis, 1 differentiation, 2 mitotic rate = 3)  (1) palliative radiation to T12, right iliac region, and right proximal femur started 03/02/2020  (2) liposomal doxorubicin/Doxil to start 03/31/2020, repeat every 28 days  (a) echocardiogram  (b) restaging chest CT  (3) supportive care: pain management (updated 03/25/2020)  (a) tylenol 500 mg/ naproxen 220 mg TID  (b) currently on fentanyl patch, 25 mcg/h  (c) MSIR 30 mg Q4h PRN, but  patient tells me she is not needing this  (d) bowel prophylaxis in place   PLAN: Samantha Lozano was going down rapidly at first but she has rallied some and then it is good to see.  She is more ambulatory, is eating some, and is drinking some.  She is having good pain control and good bowel movements.  I encouraged her to drink 1-1/2 quarts of liquid daily.  I emphasized that she needs to keep  up her bowel prophylaxis.  Pain is currently well controlled.  She does need to take her pain medicines so that she can function better.  She should continue her Synthroid but does not need to take her Metformin, which I think could make her hypoglycemic since she is not eating very much.  She has a little bit of Efudex cream for her skin cancer and she is going to try that.  I encouraged her to be as much out of bed as possible during the day  I will set her up for another video visit in 2 weeks but they know to call us for any other issue that may develop before then.   Samantha Lozano. Isak Sotomayor, MD 04/05/2020 10:35 PM Medical Oncology and Hematology [facility] [place], [place] [address] Tel. [phone]    Fax. [phone]   I, Wilburn Mylar, am acting as scribe for Dr. Virgie Lozano. Consuelo Suthers.  I, Lurline Del MD, have reviewed the above documentation for accuracy and completeness, and I agree with the above.    *Total Encounter Time as defined by the Centers for Medicare and Medicaid Services includes, in addition to the face-to-face time of a patient visit (documented in the note above) non-face-to-face time: obtaining and reviewing outside history, ordering and reviewing medications, tests or procedures, care coordination (communications with other health care professionals or caregivers) and documentation in the medical record.

## 2020-04-06 ENCOUNTER — Ambulatory Visit: Payer: PPO

## 2020-04-06 ENCOUNTER — Inpatient Hospital Stay (HOSPITAL_BASED_OUTPATIENT_CLINIC_OR_DEPARTMENT_OTHER): Payer: PPO | Admitting: Oncology

## 2020-04-06 DIAGNOSIS — C499 Malignant neoplasm of connective and soft tissue, unspecified: Secondary | ICD-10-CM

## 2020-04-07 ENCOUNTER — Telehealth: Payer: Self-pay | Admitting: Oncology

## 2020-04-07 ENCOUNTER — Telehealth: Payer: Self-pay | Admitting: Radiation Oncology

## 2020-04-07 ENCOUNTER — Ambulatory Visit: Payer: PPO

## 2020-04-07 NOTE — Telephone Encounter (Signed)
Scheduled appts per 7/28 los. Left voicemail with appt date/time/and details about it being a virtual visit.

## 2020-04-07 NOTE — Telephone Encounter (Signed)
  Radiation Oncology         [phone] ________________________________  Name: Samantha Lozano MRN: [id number]  Date of Service: 04/07/2020  DOB: 05-Oct-1945  Post Treatment Telephone Note  Diagnosis:   Metastatic leiomyosarcoma.  Interval Since Last Radiation: 12  weeks   03/02/20-03/21/20:  The patient's right pelvis, right femur, and T spine specifically along the T12 and paraspinus lesion were treated to 30 Gy in 10 fractions.  Narrative:  The patient was contacted today for routine follow-up. During treatment she did very well with radiotherapy and did not have significant desquamation.  Impression/Plan: 1. Metastatic leiomyosarcoma. I was unable to reach the patient today but I left her a voicemail and discussed that we would be happy to continue to follow her as needed, but she will also continue to follow up with Dr. Jana Hakim in medical oncology.      Carola Rhine, PAC

## 2020-04-08 ENCOUNTER — Ambulatory Visit: Payer: PPO

## 2020-04-11 ENCOUNTER — Ambulatory Visit: Payer: PPO

## 2020-04-11 ENCOUNTER — Other Ambulatory Visit: Payer: Self-pay | Admitting: *Deleted

## 2020-04-11 MED ORDER — LOSARTAN POTASSIUM 25 MG PO TABS
25.0000 mg | ORAL_TABLET | Freq: Every day | ORAL | 1 refills | Status: DC
Start: 2020-04-11 — End: 2020-05-30

## 2020-04-11 NOTE — Telephone Encounter (Signed)
Received a call from pt nurse Leandro Reasoner with [facility], requesting if ok to fill Fentanyl 25 mcg patches? Per Dr.Magrinat, OK to be filled by [facility]. Also, made aware of new pain in various areas, left elbow, right ankle, left scapula and palpable mass on left posterior flank, and bp elevation 199/101. Dr.Magrinat made aware. Losartan 25 mg was sent to pt pharmacy. Son called and made aware and verbalized understanding.

## 2020-04-12 ENCOUNTER — Ambulatory Visit: Payer: PPO

## 2020-04-13 ENCOUNTER — Ambulatory Visit: Payer: PPO

## 2020-04-14 ENCOUNTER — Ambulatory Visit: Payer: PPO

## 2020-04-16 ENCOUNTER — Other Ambulatory Visit: Payer: Self-pay | Admitting: Oncology

## 2020-04-18 NOTE — Telephone Encounter (Signed)
Is it OK for me to fill for 90 days?

## 2020-04-19 ENCOUNTER — Other Ambulatory Visit: Payer: Self-pay | Admitting: Oncology

## 2020-04-19 NOTE — Progress Notes (Signed)
[place]  [phone] [phone]     ID: Samantha Lozano DOB: August 28, 1946  MR#: [id number]  PRF#:[id number]  Patient Care Team: Orpah Melter, MD as PCP - General (Family Medicine) Yahmir Sokolov, Samantha Dad, MD as Consulting Physician (Oncology) Chauncey Cruel, MD OTHER MD:  I connected with Samantha Lozano on 04/20/20 at  8:30 AM EDT by video enabled telemedicine visit and verified that I am speaking with the correct person using two identifiers.   I discussed the limitations, risks, security and privacy concerns of performing an evaluation and management service by telemedicine and the availability of in-person appointments. I also discussed with the patient that there may be a patient responsible charge related to this service. The patient expressed understanding and agreed to proceed.   Other persons participating in the visit and their role in the encounter: Daughter-in-law [name]  Patient's location: home  Provider's location: [place]    CHIEF COMPLAINT: metastatic cancer  CURRENT TREATMENT: Comfort care under hospice   INTERVAL HISTORY: Samantha Lozano was contacted today for follow-up of her metastatic cancer. She is now under the care of hospice, for comfort care.    She tells me she gets up at about 6:30 in the morning, has coffee with her son before he goes to work and then does a little of this and a little over that" around the house.  She does have someone with her 24/7   REVIEW OF SYSTEMS: Samantha Lozano tells me the pain in her left upper extremity is worse, and starting of the elbow going down to her fingers.  Her fingers are numb.  She also has some numbness in her left foot.  The pain is worse at night but is there all the time.  Of course she continues to have pain in the right hip area as well.  She feels the fentanyl patch is working well and she takes for breakthrough morphine about 3 times a day most recently.  Despite  the narcotics she is having soft bowel movements every other day or so.  She is on MiraLAX daily and 6 stool softeners daily.   HISTORY OF CURRENT ILLNESS: From the original intake note:  Samantha Lozano presented to the ED on 01/10/2020 with right-sided chest pain for one week. She described it as intermittent sharp pain, worse with deep breathing and certain movements, located near the right breast. She underwent chest CT that day showing: innumerable bilateral pulmonary nodules concerning for metastatic disease; multiple lytic lesions involving bilateral ribs, proximal right humerus, and thoracic spine; probable nondisplaced pathologic fracture involving right lateral 5th rib; no acute cardiopulmonary process; no acute abnormality identified within upper abdomen.  She had bilateral screening mammography at [facility] on 05/26/2019 showing the breast density to be category C.  There was a 1.4 cm group of calcifications in the lower medial quadrant of the right breast which appeared changed.  She was brought back on 05/29/2019 for unilateral diagnostic right mammography which showed only benign scattered calcifications and benign vascular calcifications in the right breast.  There was no evidence of malignancy.  The patient's subsequent history is as detailed below.   PAST MEDICAL HISTORY: Past Medical History:  Diagnosis Date  . Diabetes mellitus, type 2 ([place])   . HLD (hyperlipidemia)   . HTN (hypertension)   . Leg edema   . Other specified disorders of thyroid     PAST SURGICAL HISTORY: Past Surgical History:  Procedure Laterality Date  .  ABDOMINAL HYSTERECTOMY    . THYROID SURGERY      FAMILY HISTORY: Family History  Problem Relation Age of Onset  . Diabetes Mother   . Hypertension Mother   . Cancer Mother   . Obesity Mother   . Hypertension Father   . Cancer Father   . Obesity Father   The patient's father died at age 10 with cancer but the patient does not know what type.  He  had many siblings and many of them had cancer but she does not know what types they may have been.  The patient's mother died at age 11 from what sounds like an anaplastic lymphoma.  The patient herself had 7 siblings.  One half sister died from ovarian cancer in her 55s.  1 brother died from pancreatic cancer age 72.   GYNECOLOGIC HISTORY:  No LMP recorded. Patient has had a hysterectomy. Menarche: 74 years old Age at first live birth: 74 years old GX P 1 LMP 66s? HRT no  Hysterectomy? Yes, 09/2007, benign pathology BSO? 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  She is widowed, lives by herself, with no pets.  Son Samantha Lozano runs a water purification program.  His wife, [name], works in the [employer] at [employer] long.  The patient is not a church attender.    ADVANCED DIRECTIVES: Son Samantha Lozano and daughter-in-law [name] are jointly healthcare powers of attorney   HEALTH MAINTENANCE: Social History   Tobacco Use  . Smoking status: Never Smoker  . Smokeless tobacco: Never Used  Substance Use Topics  . Alcohol use: Not Currently  . Drug use: Not Currently     Colonoscopy: 03/2019  PAP: 06/2013, negative  Bone density:    Allergies  Allergen Reactions  . Lisinopril Cough    Current Outpatient Medications  Medication Sig Dispense Refill  . acetaminophen (TYLENOL) 500 MG tablet Take 1 tablet (500 mg total) by mouth in the morning, at noon, and at bedtime. Take with aleve 220 mg 120 tablet 0  . aspirin EC 81 MG tablet Take 81 mg by mouth daily.    . bisacodyl (DULCOLAX) 10 MG suppository Place 1 suppository (10 mg total) rectally daily as needed for moderate constipation. 12 suppository 0  . carvedilol (COREG) 25 MG tablet Take 25 mg by mouth 2 (two) times daily with a meal.    . fentaNYL (DURAGESIC) 25 MCG/HR Place 1 patch onto the skin every 3 (three) days. 5 patch 0  . gabapentin  (NEURONTIN) 300 MG capsule Take 1 capsule (300 mg total) by mouth at bedtime. 30 capsule 4  . glucose blood test strip Check BS twice daily 100 each 0  . hydrochlorothiazide (HYDRODIURIL) 25 MG tablet Take 25 mg by mouth daily.    [name] levothyroxine (SYNTHROID, LEVOTHROID) 125 MCG tablet Take 125 mcg by mouth daily before breakfast.    . LORazepam (ATIVAN) 2 MG/ML concentrated solution Take 0.3 mLs (0.6 mg total) by mouth every 6 (six) hours as needed for anxiety. 30 mL 0  . losartan (COZAAR) 25 MG tablet Take 1 tablet (25 mg total) by mouth daily. 30 tablet 1  . lovastatin (MEVACOR) 40 MG tablet Take 40 mg by mouth at bedtime.    . metFORMIN (GLUCOPHAGE) 850 MG tablet Take 1 tablet (850 mg total) by mouth daily. with food 30 tablet 0  . methocarbamol (ROBAXIN) 500 MG tablet Take 2 tablets (1,000 mg total)  by mouth 4 (four) times daily. 240 tablet 0  . morphine (MSIR) 30 MG tablet Take 1 tablet (30 mg total) by mouth every 3 (three) hours as needed for severe pain. 120 tablet 0  . naproxen sodium (ALEVE) 220 MG tablet Take 1 tablet (220 mg total) by mouth in the morning, at noon, and at bedtime. Take with tylenol 500 mg 120 tablet 0  . Omega-3 Fatty Acids (FISH OIL) 435 MG CAPS Take 435 mg by mouth daily at 2 PM.     . ondansetron (ZOFRAN ODT) 8 MG disintegrating tablet Take 1 tablet (8 mg total) by mouth every 8 (eight) hours as needed for nausea or vomiting (PRN 3 times daily dissolve under the tongue). 20 tablet 0  . polyethylene glycol (MIRALAX / GLYCOLAX) 17 g packet Take 17 g by mouth 2 (two) times daily. 60 each 1  . prochlorperazine (COMPAZINE) 10 MG tablet Take 1 tablet (10 mg total) by mouth every 6 (six) hours as needed for nausea or vomiting. 60 tablet 0  . promethazine (PHENERGAN) 25 MG suppository Place 1 suppository (25 mg total) rectally every 6 (six) hours as needed for nausea or vomiting. 12 each 0  . promethazine (PHENERGAN) 25 MG suppository Place 1 suppository (25 mg total) rectally  every 6 (six) hours as needed for nausea or vomiting. 12 each 0  . senna (SENOKOT) 8.6 MG TABS tablet Take 2 tablets (17.2 mg total) by mouth 2 (two) times daily. 120 tablet 1  . venlafaxine XR (EFFEXOR-XR) 75 MG 24 hr capsule TAKE 1 CAPSULE (75 MG TOTAL) BY MOUTH DAILY WITH BREAKFAST. 90 capsule 4   No current facility-administered medications for this visit.    OBJECTIVE:   There were no vitals filed for this visit. Wt Readings from Last 3 Encounters:  03/25/20 211 lb 14.4 oz (96.1 kg)  03/03/20 220 lb (99.8 kg)  02/28/20 224 lb 10.4 oz (101.9 kg)   There is no height or weight on file to calculate BMI.    ECOG FS:2 - Symptomatic, <50% confined to bed  Telemedicine visit 04/20/2020  LAB RESULTS:  CMP     Component Value Date/Time   NA 137 03/25/2020 0806   NA 141 11/27/2018 1027   K 3.5 03/25/2020 0806   CL 96 (L) 03/25/2020 0806   CO2 31 03/25/2020 0806   GLUCOSE 176 (H) 03/25/2020 0806   BUN 10 03/25/2020 0806   BUN 19 11/27/2018 1027   CREATININE 0.79 03/25/2020 0806   CALCIUM 9.6 03/25/2020 0806   PROT 7.1 03/25/2020 0806   PROT 7.5 11/27/2018 1027   ALBUMIN 3.6 03/25/2020 0806   ALBUMIN 4.6 11/27/2018 1027   AST 21 03/25/2020 0806   ALT 22 03/25/2020 0806   ALKPHOS 85 03/25/2020 0806   BILITOT 0.6 03/25/2020 0806   GFRNONAA >60 03/25/2020 0806   GFRAA >60 03/25/2020 0806    No results found for: TOTALPROTELP, ALBUMINELP, A1GS, A2GS, BETS, BETA2SER, GAMS, MSPIKE, SPEI  Lab Results  Component Value Date   WBC 3.9 (L) 03/25/2020   NEUTROABS 2.7 03/25/2020   HGB 12.7 03/25/2020   HCT 37.2 03/25/2020   MCV 82.3 03/25/2020   PLT 117 (L) 03/25/2020    No results found for: LABCA2  No components found for: KVQQVZ563  No results for input(s): INR in the last 168 hours.  No results found for: [facility]  Lab Results  Component Value Date   OVF643 14 01/13/2020    No results found for: PIR518  No results found for: DHR416  Lab Results  Component  Value Date   CA2729 27.5 01/13/2020    No components found for: HGQUANT  Lab Results  Component Value Date   CEA1 <1.00 01/13/2020   /  CEA ([facility]In [facility])  Date Value Ref Range Status  01/13/2020 <1.00 0.00 - 5.00 ng/mL Final    Comment:    (NOTE) This test was performed using Architect's Chemiluminescent Microparticle Immunoassay. Values obtained from different assay methods cannot be used interchangeably. Please note that 5-10% of patients who smoke may see CEA levels up to 6.9 ng/mL. Performed at [facility], [place] [address] [place], [place] [address]      No results found for: AFPTUMOR  No results found for: CHROMOGRNA  No results found for: KPAFRELGTCHN, LAMBDASER, KAPLAMBRATIO (kappa/lambda light chains)  No results found for: HGBA, HGBA2QUANT, HGBFQUANT, HGBSQUAN (Hemoglobinopathy evaluation)   Lab Results  Component Value Date   LDH 223 (H) 01/13/2020    Lab Results  Component Value Date   IRON 48 01/13/2020   TIBC 321 01/13/2020   IRONPCTSAT 15 (L) 01/13/2020   (Iron and TIBC)  Lab Results  Component Value Date   FERRITIN 63 01/13/2020    Urinalysis    Component Value Date/Time   COLORURINE YELLOW 03/03/2020 1842   APPEARANCEUR CLEAR 03/03/2020 1842   LABSPEC 1.020 03/03/2020 1842   PHURINE 5.0 03/03/2020 1842   GLUCOSEU NEGATIVE 03/03/2020 1842   HGBUR SMALL (A) 03/03/2020 1842   BILIRUBINUR NEGATIVE 03/03/2020 1842   KETONESUR NEGATIVE 03/03/2020 1842   PROTEINUR NEGATIVE 03/03/2020 1842   NITRITE NEGATIVE 03/03/2020 1842   LEUKOCYTESUR TRACE (A) 03/03/2020 1842    STUDIES: No results found.   ELIGIBLE FOR AVAILABLE RESEARCH PROTOCOL: no  ASSESSMENT: 74 y.o. [facility] woman presenting 01/10/2020 with pleuritic chest pain, chest CT scan same day showing innumerable bilateral pulmonary nodules, largest in the superior left lower lobe measuring 2.5 cm largest on the right side in the lower lobe at 1.9 cm.   Scan also showed lytic lesions at T12, multiple ribs, and right humeral head  (a) CEA, CA 19-9, CA 27-29 all within normal limits 01/13/2020  (b) bone marrow biopsy 01/15/2020 nondiagnostic  (c) right breast mammography and ultrasonography led to biopsy [id number], all benign  (d) head CT with and without contrast 01/21/2020 no evidence of brain metastases  (e) PET scan 02/05/2020 shows multiple lung lesions, a soft tissue mass in the lower left back adjacent to the 12th rib, no abnormal liver activity, no hypermetabolic activity in the colon, pancreas, spleen, or adrenals, expansile lytic lesion in the medial right iliac bone measuring 3.5 cm with SUV of 4.0 and a smaller lytic lesion in the right iliac bone adjacent to the acetabulum measuring 1.5 cm with an SUV max of 4.  (f) CT biopsy obtained 02/17/2020 from the paraspinal mass shows ([phone]) spindle cell malignancy consistent with leiomyosarcoma, low-grade, stage IV (0 necrosis, 1 differentiation, 2 mitotic rate = 3)  (1) palliative radiation to T12, right iliac region, and right proximal femur started 03/02/2020  (2) liposomal doxorubicin/Doxil to start 03/31/2020, repeat every 28 days  (a) echocardiogram  (b) restaging chest CT  (3) supportive care: pain management (updated 03/25/2020)  (a) tylenol 500 mg/ naproxen 220 mg TID  (b) currently on fentanyl patch, 25 mcg/h  (c) MSIR 30 mg Q4h PRN, but patient tells me she is not needing this  (d) bowel prophylaxis in place  PLAN: Samantha Lozano is having more pain in the left arm starting of the elbow and going down to her fingers.  Her fingers are numb.  This is neuropathic pain.  It keeps her up at night.  We discussed gabapentin for this and I am starting her on gabapentin 300 mg at bedtime.  If that works for her we can consider some gabapentin during the day but of course the concern there is excessive somnolence.  Otherwise she is doing quite well as far as pain and bowel prophylaxis is  concerned and I am not making any change on her fentanyl patch or MSIR.  We will have another virtual conversation in 2 weeks.  Samantha Lozano. Chellsie Gomer, MD 04/20/2020 8:45 AM Medical Oncology and Hematology [facility] [place], [place] [address] Tel. [phone]    Fax. [phone]   I, Wilburn Mylar, am acting as scribe for Dr. Virgie Lozano. Samantha Lozano.  I, Lurline Del MD, have reviewed the above documentation for accuracy and completeness, and I agree with the above.   *Total Encounter Time as defined by the Centers for Medicare and Medicaid Services includes, in addition to the face-to-face time of a patient visit (documented in the note above) non-face-to-face time: obtaining and reviewing outside history, ordering and reviewing medications, tests or procedures, care coordination (communications with other health care professionals or caregivers) and documentation in the medical record.

## 2020-04-20 ENCOUNTER — Inpatient Hospital Stay: Payer: PPO | Attending: Oncology | Admitting: Oncology

## 2020-04-20 DIAGNOSIS — C7800 Secondary malignant neoplasm of unspecified lung: Secondary | ICD-10-CM

## 2020-04-20 DIAGNOSIS — C801 Malignant (primary) neoplasm, unspecified: Secondary | ICD-10-CM | POA: Insufficient documentation

## 2020-04-20 DIAGNOSIS — Z515 Encounter for palliative care: Secondary | ICD-10-CM | POA: Insufficient documentation

## 2020-04-20 DIAGNOSIS — C7951 Secondary malignant neoplasm of bone: Secondary | ICD-10-CM | POA: Insufficient documentation

## 2020-04-20 MED ORDER — GABAPENTIN 300 MG PO CAPS
300.0000 mg | ORAL_CAPSULE | Freq: Every day | ORAL | 4 refills | Status: DC
Start: 2020-04-20 — End: 2020-05-26

## 2020-04-20 MED ORDER — FENTANYL 25 MCG/HR TD PT72: 1.0000 | MEDICATED_PATCH | TRANSDERMAL | 0 refills | Status: DC

## 2020-04-20 MED ORDER — MORPHINE SULFATE 30 MG PO TABS: 30.0000 mg | ORAL_TABLET | ORAL | 0 refills | Status: DC | PRN

## 2020-04-23 NOTE — Progress Notes (Signed)
  Radiation Oncology         [phone] ________________________________  Name: Samantha Lozano MRN: [id number]  Date: 03/21/2020  DOB: May 11, 1946  End of Treatment Note  Diagnosis:   bone metastasis     Indication for treatment::  palliative       Radiation treatment dates:   03/02/20 - 03/21/20  Site/dose:   1. The right pelvis was treated to a dose of 30 Gy in 10 fractions using an isodose plan. This consisted of 5 fields. 2. The right femur was treated to a dose of 30 Gy in 10 fractions using an isodose plan. This consisted of 2 fields. 3. The T-spine was treated to a dose of 30 Gy in 10 fractions using an isodose plan. This consisted of 3 fields.   Narrative: The patient tolerated radiation treatment relatively well.     Plan: The patient has completed radiation treatment. The patient will return to radiation oncology clinic for routine followup in one month. I advised the patient to call or return sooner if they have any questions or concerns related to their recovery or treatment. ________________________________  Jodelle Gross, M.D., Ph.D.

## 2020-04-28 ENCOUNTER — Other Ambulatory Visit: Payer: PPO

## 2020-04-28 ENCOUNTER — Other Ambulatory Visit: Payer: Self-pay | Admitting: Oncology

## 2020-04-28 ENCOUNTER — Encounter: Payer: Self-pay | Admitting: *Deleted

## 2020-04-28 ENCOUNTER — Ambulatory Visit: Payer: PPO | Admitting: Adult Health

## 2020-04-28 ENCOUNTER — Ambulatory Visit: Payer: PPO

## 2020-04-28 ENCOUNTER — Other Ambulatory Visit: Payer: Self-pay | Admitting: *Deleted

## 2020-04-28 MED ORDER — FENTANYL 50 MCG/HR TD PT72: 1.0000 | MEDICATED_PATCH | TRANSDERMAL | 0 refills | Status: DC

## 2020-05-05 ENCOUNTER — Other Ambulatory Visit: Payer: Self-pay | Admitting: Oncology

## 2020-05-05 ENCOUNTER — Encounter: Payer: Self-pay | Admitting: Oncology

## 2020-05-05 NOTE — Progress Notes (Signed)
Samantha Lozano has daughter-in-law who is her primary caregiver called to tell us that the patient was having problems swelling.  Samantha Lozano had been on hydrochlorothiazide previously.  We are reinstating that particularly since her blood pressure has been reading high.  Her pain is generally well controlled at this point on narcotics.  Note that she is not using Aleve, which could also of course cause swelling.  She is having some musculoskeletal pain in her shoulders which Samantha Lozano thinks may or may not be cancer related.  The patient is weaker and is having to push herself off chairs and bed to get up and this may be causing some distress there.  They do have Robaxin on hand and I have no problem with him using that.  In general some days Samantha Lozano has no pain other days she has some pain and takes the extra morphine.  They are being quite careful with the bowel prophylaxis and they understand this is an ongoing issue that needs to be attended to.  They are right on top of it.  I am going to speak with them again on 05/25/2020 but they know to call for any other issue that may develop before the next visit.

## 2020-05-06 ENCOUNTER — Telehealth: Payer: Self-pay | Admitting: Oncology

## 2020-05-06 NOTE — Telephone Encounter (Signed)
Scheduled appt per 8/26 sch msg- pt daughter in law is aware of appt date and time

## 2020-05-12 ENCOUNTER — Telehealth: Payer: Self-pay

## 2020-05-12 ENCOUNTER — Other Ambulatory Visit: Payer: Self-pay | Admitting: Hematology and Oncology

## 2020-05-12 ENCOUNTER — Other Ambulatory Visit: Payer: Self-pay

## 2020-05-12 ENCOUNTER — Encounter: Payer: Self-pay | Admitting: Oncology

## 2020-05-12 MED ORDER — FENTANYL 50 MCG/HR TD PT72: 1.0000 | MEDICATED_PATCH | TRANSDERMAL | 0 refills | Status: DC

## 2020-05-12 MED ORDER — FUROSEMIDE 20 MG PO TABS
ORAL_TABLET | ORAL | 0 refills | Status: DC
Start: 2020-05-12 — End: 2020-05-26

## 2020-05-12 NOTE — Telephone Encounter (Signed)
TC from [place] w/ [facility] to report elevated BP yesterday of 191/117 (taken on left wrist) pain score 3/10 left elbow.  Nurse reports previous readings 8/18-166/88, 8/25-158/94.  Patient continues HCTZ without reduction of ankle edema.  Patient has increased fatigue, new "poss tumors" right thorax, right foot, left shoulder and increased left shoulder pain.  Nurse reports patient is using fentanyl but is taking less MS breakthrough because she is worried about constipation. Please advise.

## 2020-05-12 NOTE — Telephone Encounter (Signed)
Received verbal orders from Dr. Jana Hakim- change HCTZ to lasix 20mg  po q am; may repeat x1 daily.  Maura with [facility] and daughter in law [name] made aware of changes.  Rx sent to [facility].

## 2020-05-22 ENCOUNTER — Other Ambulatory Visit: Payer: Self-pay | Admitting: Oncology

## 2020-05-25 ENCOUNTER — Inpatient Hospital Stay: Payer: PPO | Attending: Oncology | Admitting: Oncology

## 2020-05-25 DIAGNOSIS — C499 Malignant neoplasm of connective and soft tissue, unspecified: Secondary | ICD-10-CM

## 2020-05-25 DIAGNOSIS — G893 Neoplasm related pain (acute) (chronic): Secondary | ICD-10-CM | POA: Insufficient documentation

## 2020-05-25 DIAGNOSIS — C7951 Secondary malignant neoplasm of bone: Secondary | ICD-10-CM | POA: Insufficient documentation

## 2020-05-25 DIAGNOSIS — Z79899 Other long term (current) drug therapy: Secondary | ICD-10-CM | POA: Insufficient documentation

## 2020-05-25 DIAGNOSIS — Z515 Encounter for palliative care: Secondary | ICD-10-CM | POA: Insufficient documentation

## 2020-05-25 DIAGNOSIS — M7989 Other specified soft tissue disorders: Secondary | ICD-10-CM | POA: Insufficient documentation

## 2020-05-25 DIAGNOSIS — C801 Malignant (primary) neoplasm, unspecified: Secondary | ICD-10-CM | POA: Insufficient documentation

## 2020-05-25 DIAGNOSIS — C7800 Secondary malignant neoplasm of unspecified lung: Secondary | ICD-10-CM | POA: Insufficient documentation

## 2020-05-25 NOTE — Progress Notes (Signed)
Samantha Lozano  [phone] [phone]     ID: Samantha Lozano DOB: 07-22-1946  MR#: [id number]  PJK#:[id number]  Patient Care Team: Orpah Melter, MD as PCP - General (Family Medicine) Bryten Maher, Virgie Dad, MD as Consulting Physician (Oncology) Chauncey Cruel, MD OTHER MD:  I connected with Samantha Lozano on 05/25/20 at  1:45 PM EDT by video enabled telemedicine visit and verified that I am speaking with the correct person using two identifiers.   I discussed the limitations, risks, security and privacy concerns of performing an evaluation and management service by telemedicine and the availability of in-person appointments. I also discussed with the patient that there may be a patient responsible charge related to this service. The patient expressed understanding and agreed to proceed.   Other persons participating in the visit and their role in the encounter: Daughter-in-law [name]  Patient's location: home  Provider's location: [place]    CHIEF COMPLAINT: metastatic cancer  CURRENT TREATMENT: Comfort care under hospice   INTERVAL HISTORY: Samantha Lozano was contacted today for follow-up of her metastatic cancer. She is now under the care of hospice, for comfort care.     REVIEW OF SYSTEMS: Samantha Lozano tells me her pain currently is generally well controlled.  She is also managing her constipation well and is staying ahead of it.  She does have significant swelling of the right leg and this only improved slightly with elevation.  She does not think she can get compression stockings on.  She also has significant pain in the left forearm down to her fourth and fifth digits.  Review of systems otherwise was generally stable.   HISTORY OF CURRENT ILLNESS: From the original intake note:  Samantha Lozano presented to the ED on 01/10/2020 with right-sided chest pain for one week. She described it as intermittent sharp pain, worse with deep  breathing and certain movements, located near the right breast. She underwent chest CT that day showing: innumerable bilateral pulmonary nodules concerning for metastatic disease; multiple lytic lesions involving bilateral ribs, proximal right humerus, and thoracic spine; probable nondisplaced pathologic fracture involving right lateral 5th rib; no acute cardiopulmonary process; no acute abnormality identified within upper abdomen.  She had bilateral screening mammography at [facility] on 05/26/2019 showing the breast density to be category C.  There was a 1.4 cm group of calcifications in the lower medial quadrant of the right breast which appeared changed.  She was brought back on 05/29/2019 for unilateral diagnostic right mammography which showed only benign scattered calcifications and benign vascular calcifications in the right breast.  There was no evidence of malignancy.  The patient's subsequent history is as detailed below.   PAST MEDICAL HISTORY: Past Medical History:  Diagnosis Date  . Diabetes mellitus, type 2 ([place])   . HLD (hyperlipidemia)   . HTN (hypertension)   . Leg edema   . Other specified disorders of thyroid     PAST SURGICAL HISTORY: Past Surgical History:  Procedure Laterality Date  . ABDOMINAL HYSTERECTOMY    . THYROID SURGERY      FAMILY HISTORY: Family History  Problem Relation Age of Onset  . Diabetes Mother   . Hypertension Mother   . Cancer Mother   . Obesity Mother   . Hypertension Father   . Cancer Father   . Obesity Father   The patient's father died at age 24 with cancer but the patient does not know what type.  He had many siblings and  many of them had cancer but she does not know what types they may have been.  The patient's mother died at age 93 from what sounds like an anaplastic lymphoma.  The patient herself had 7 siblings.  One half sister died from ovarian cancer in her 72s.  1 brother died from pancreatic cancer age 39.   GYNECOLOGIC HISTORY:    No LMP recorded. Patient has had a hysterectomy. Menarche: 74 years old Age at first live birth: 74 years old GX P 1 LMP 30s? HRT no  Hysterectomy? Yes, 09/2007, benign pathology BSO? 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  She is widowed, lives by herself, with no pets.  Son Samantha Lozano runs a water purification program.  His wife, [name], works in the [employer] long.  The patient is not a church attender.    ADVANCED DIRECTIVES: Son Samantha Lozano and daughter-in-law [name] are jointly healthcare powers of attorney   HEALTH MAINTENANCE: Social History   Tobacco Use  . Smoking status: Never Smoker  . Smokeless tobacco: Never Used  Substance Use Topics  . Alcohol use: Not Currently  . Drug use: Not Currently     Colonoscopy: 03/2019  PAP: 06/2013, negative  Bone density:    Allergies  Allergen Reactions  . Lisinopril Cough    Current Outpatient Medications  Medication Sig Dispense Refill  . acetaminophen (TYLENOL) 500 MG tablet Take 1 tablet (500 mg total) by mouth in the morning, at noon, and at bedtime. Take with aleve 220 mg 120 tablet 0  . aspirin EC 81 MG tablet Take 81 mg by mouth daily.    . bisacodyl (DULCOLAX) 10 MG suppository Place 1 suppository (10 mg total) rectally daily as needed for moderate constipation. 12 suppository 0  . carvedilol (COREG) 25 MG tablet Take 25 mg by mouth 2 (two) times daily with a meal.    . fentaNYL (DURAGESIC) 50 MCG/HR Place 1 patch onto the skin every 3 (three) days. 10 patch 0  . furosemide (LASIX) 20 MG tablet Take 1 tablet by mouth daily; may repeat 1 tablet daily as needed. 30 tablet 0  . gabapentin (NEURONTIN) 300 MG capsule Take 1 capsule (300 mg total) by mouth at bedtime. 30 capsule 4  . glucose blood test strip Check BS twice daily 100 each 0  . hydrochlorothiazide (HYDRODIURIL) 25 MG tablet Take 25 mg by mouth daily.    [name]  levothyroxine (SYNTHROID, LEVOTHROID) 125 MCG tablet Take 125 mcg by mouth daily before breakfast.    . LORazepam (ATIVAN) 2 MG/ML concentrated solution Take 0.3 mLs (0.6 mg total) by mouth every 6 (six) hours as needed for anxiety. 30 mL 0  . losartan (COZAAR) 25 MG tablet Take 1 tablet (25 mg total) by mouth daily. 30 tablet 1  . lovastatin (MEVACOR) 40 MG tablet Take 40 mg by mouth at bedtime.    . metFORMIN (GLUCOPHAGE) 850 MG tablet Take 1 tablet (850 mg total) by mouth daily. with food 30 tablet 0  . methocarbamol (ROBAXIN) 500 MG tablet Take 2 tablets (1,000 mg total) by mouth 4 (four) times daily. 240 tablet 0  . morphine (MSIR) 30 MG tablet Take 1 tablet (30 mg total) by mouth every 3 (three) hours as needed for severe pain. 120 tablet 0  . Omega-3 Fatty Acids (FISH OIL) 435 MG CAPS Take 435 mg by mouth daily at 2 PM.     .  ondansetron (ZOFRAN ODT) 8 MG disintegrating tablet Take 1 tablet (8 mg total) by mouth every 8 (eight) hours as needed for nausea or vomiting (PRN 3 times daily dissolve under the tongue). 20 tablet 0  . polyethylene glycol (MIRALAX / GLYCOLAX) 17 g packet Take 17 g by mouth 2 (two) times daily. 60 each 1  . prochlorperazine (COMPAZINE) 10 MG tablet Take 1 tablet (10 mg total) by mouth every 6 (six) hours as needed for nausea or vomiting. 60 tablet 0  . promethazine (PHENERGAN) 25 MG suppository Place 1 suppository (25 mg total) rectally every 6 (six) hours as needed for nausea or vomiting. 12 each 0  . promethazine (PHENERGAN) 25 MG suppository Place 1 suppository (25 mg total) rectally every 6 (six) hours as needed for nausea or vomiting. 12 each 0  . senna (SENOKOT) 8.6 MG TABS tablet Take 2 tablets (17.2 mg total) by mouth 2 (two) times daily. 120 tablet 1  . venlafaxine XR (EFFEXOR-XR) 75 MG 24 hr capsule TAKE 1 CAPSULE (75 MG TOTAL) BY MOUTH DAILY WITH BREAKFAST. 90 capsule 4   No current facility-administered medications for this visit.    OBJECTIVE:   There  were no vitals filed for this visit. Wt Readings from Last 3 Encounters:  03/25/20 211 lb 14.4 oz (96.1 kg)  03/03/20 220 lb (99.8 kg)  02/28/20 224 lb 10.4 oz (101.9 kg)   There is no height or weight on file to calculate BMI.    ECOG FS:2 - Symptomatic, <50% confined to bed  Telemedicine visit 05/25/2020  LAB RESULTS:  CMP     Component Value Date/Time   NA 137 03/25/2020 0806   NA 141 11/27/2018 1027   K 3.5 03/25/2020 0806   CL 96 (L) 03/25/2020 0806   CO2 31 03/25/2020 0806   GLUCOSE 176 (H) 03/25/2020 0806   BUN 10 03/25/2020 0806   BUN 19 11/27/2018 1027   CREATININE 0.79 03/25/2020 0806   CALCIUM 9.6 03/25/2020 0806   PROT 7.1 03/25/2020 0806   PROT 7.5 11/27/2018 1027   ALBUMIN 3.6 03/25/2020 0806   ALBUMIN 4.6 11/27/2018 1027   AST 21 03/25/2020 0806   ALT 22 03/25/2020 0806   ALKPHOS 85 03/25/2020 0806   BILITOT 0.6 03/25/2020 0806   GFRNONAA >60 03/25/2020 0806   GFRAA >60 03/25/2020 0806    No results found for: TOTALPROTELP, ALBUMINELP, A1GS, A2GS, BETS, BETA2SER, GAMS, MSPIKE, SPEI  Lab Results  Component Value Date   WBC 3.9 (L) 03/25/2020   NEUTROABS 2.7 03/25/2020   HGB 12.7 03/25/2020   HCT 37.2 03/25/2020   MCV 82.3 03/25/2020   PLT 117 (L) 03/25/2020    No results found for: LABCA2  No components found for: ULAGTX646  No results for input(s): INR in the last 168 hours.  No results found for: LABCA2  Lab Results  Component Value Date   OEH212 14 01/13/2020    No results found for: YQM250  No results found for: IBB048  Lab Results  Component Value Date   CA2729 27.5 01/13/2020    No components found for: HGQUANT  Lab Results  Component Value Date   CEA1 <1.00 01/13/2020   /  CEA ([place])  Date Value Ref Range Status  01/13/2020 <1.00 0.00 - 5.00 ng/mL Final    Comment:    (NOTE) This test was performed using Architect's Chemiluminescent Microparticle Immunoassay. Values obtained from different assay methods  cannot be used interchangeably. Please note that 5-10% of patients  who smoke may see CEA levels up to 6.9 ng/mL. Performed at [facility], [address], [place], [place] [address]      No results found for: AFPTUMOR  No results found for: CHROMOGRNA  No results found for: KPAFRELGTCHN, LAMBDASER, KAPLAMBRATIO (kappa/lambda light chains)  No results found for: HGBA, HGBA2QUANT, HGBFQUANT, HGBSQUAN (Hemoglobinopathy evaluation)   Lab Results  Component Value Date   LDH 223 (H) 01/13/2020    Lab Results  Component Value Date   IRON 48 01/13/2020   TIBC 321 01/13/2020   IRONPCTSAT 15 (L) 01/13/2020   (Iron and TIBC)  Lab Results  Component Value Date   FERRITIN 63 01/13/2020    Urinalysis    Component Value Date/Time   COLORURINE YELLOW 03/03/2020 1842   APPEARANCEUR CLEAR 03/03/2020 1842   LABSPEC 1.020 03/03/2020 1842   PHURINE 5.0 03/03/2020 1842   GLUCOSEU NEGATIVE 03/03/2020 1842   HGBUR SMALL (A) 03/03/2020 1842   BILIRUBINUR NEGATIVE 03/03/2020 1842   KETONESUR NEGATIVE 03/03/2020 1842   PROTEINUR NEGATIVE 03/03/2020 1842   NITRITE NEGATIVE 03/03/2020 1842   LEUKOCYTESUR TRACE (A) 03/03/2020 1842    STUDIES: No results found.   ELIGIBLE FOR AVAILABLE RESEARCH PROTOCOL: no  ASSESSMENT: 74 y.o. [facility] woman presenting 01/10/2020 with pleuritic chest pain, chest CT scan same day showing innumerable bilateral pulmonary nodules, largest in the superior left lower lobe measuring 2.5 cm largest on the right side in the lower lobe at 1.9 cm.  Scan also showed lytic lesions at T12, multiple ribs, and right humeral head  (a) CEA, CA 19-9, CA 27-29 all within normal limits 01/13/2020  (b) bone marrow biopsy 01/15/2020 nondiagnostic  (c) right breast mammography and ultrasonography led to biopsy [id number], all benign  (d) head CT with and without contrast 01/21/2020 no evidence of brain metastases  (e) PET scan 02/05/2020 shows  multiple lung lesions, a soft tissue mass in the lower left back adjacent to the 12th rib, no abnormal liver activity, no hypermetabolic activity in the colon, pancreas, spleen, or adrenals, expansile lytic lesion in the medial right iliac bone measuring 3.5 cm with SUV of 4.0 and a smaller lytic lesion in the right iliac bone adjacent to the acetabulum measuring 1.5 cm with an SUV max of 4.  (f) CT biopsy obtained 02/17/2020 from the paraspinal mass shows (WS-2 [phone]) spindle cell malignancy consistent with leiomyosarcoma, low-grade, stage IV (0 necrosis, 1 differentiation, 2 mitotic rate = 3)  (1) palliative radiation to T12, right iliac region, and right proximal femur started 03/02/2020  (2) liposomal doxorubicin/Doxil to start 03/31/2020, repeat every 28 days  (a) echocardiogram  (b) restaging chest CT  (3) supportive care: pain management (updated 03/25/2020)  (a) tylenol 500 mg/ naproxen 220 mg TID  (b) currently on fentanyl patch, 25 mcg/h  (c) MSIR 30 mg Q4h PRN  (d) bowel prophylaxis in place   PLAN: Prabhleen continues to have significant pain in the left forearm.  We tried gabapentin for this and it does not seem to have helped very much.  Her hospice nurse will be coming back tomorrow 05/26/2020 I have given her my pager so she can call me and can examine the arm and let me know what she finds.  If there were a mass there causing this we could proceed to radiation.  The right lower extremity swelling is more difficult to deal with.  She could obtain Ace wrap send rapid while in bed and this might allow  her to walk some.  However even if that is not the case I suggest that she could get in her wheelchair and get out of bed and roll around the house so she has more mobility.  I thought Samantha Lozano would have more rapid progression then she appears to be having.  At this point we are doing virtual visits every couple of weeks but I will be glad to see her in person if things stabilize  further.  Virgie Dad. Sharonlee Nine, MD 05/25/2020 5:44 PM Medical Oncology and Hematology [facility] [place], [place] [address] Tel. [phone]    Fax. [phone]   I, Wilburn Mylar, am acting as scribe for Dr. Virgie Dad. Thu Baggett.  I, Lurline Del MD, have reviewed the above documentation for accuracy and completeness, and I agree with the above.   *Total Encounter Time as defined by the Centers for Medicare and Medicaid Services includes, in addition to the face-to-face time of a patient visit (documented in the note above) non-face-to-face time: obtaining and reviewing outside history, ordering and reviewing medications, tests or procedures, care coordination (communications with other health care professionals or caregivers) and documentation in the medical record.

## 2020-05-26 ENCOUNTER — Ambulatory Visit: Payer: PPO

## 2020-05-26 ENCOUNTER — Other Ambulatory Visit: Payer: Self-pay | Admitting: Oncology

## 2020-05-26 ENCOUNTER — Ambulatory Visit: Payer: PPO | Admitting: Oncology

## 2020-05-26 ENCOUNTER — Telehealth: Payer: Self-pay | Admitting: Oncology

## 2020-05-26 ENCOUNTER — Other Ambulatory Visit: Payer: PPO

## 2020-05-26 MED ORDER — GABAPENTIN 300 MG PO CAPS
300.0000 mg | ORAL_CAPSULE | Freq: Four times a day (QID) | ORAL | 4 refills | Status: DC
Start: 2020-05-26 — End: 2020-05-26

## 2020-05-26 MED ORDER — GABAPENTIN 300 MG PO CAPS: 300.0000 mg | ORAL_CAPSULE | Freq: Four times a day (QID) | ORAL | 4 refills | Status: DC

## 2020-05-26 NOTE — Telephone Encounter (Signed)
Scheduled appts per 9/15 los. Pt confirmed appt date and time.

## 2020-05-26 NOTE — Progress Notes (Signed)
Samantha Lozano from [facility] [place] care and the nurse taking care of Ms. Samantha Lozano call me today and healthy doing physical exam of the left lower extremity.  We are going to try gabapentin 3 mg 4 times daily and see if that controls the pain a little better

## 2020-05-30 ENCOUNTER — Other Ambulatory Visit: Payer: Self-pay | Admitting: Oncology

## 2020-06-01 ENCOUNTER — Other Ambulatory Visit: Payer: Self-pay

## 2020-06-01 MED ORDER — GABAPENTIN 100 MG PO CAPS: 100.0000 mg | ORAL_CAPSULE | Freq: Every day | ORAL | 0 refills | Status: DC

## 2020-06-01 MED ORDER — GABAPENTIN 300 MG PO CAPS: 300.0000 mg | ORAL_CAPSULE | Freq: Every day | ORAL | 4 refills | Status: DC

## 2020-06-01 NOTE — Progress Notes (Signed)
Mickel Baas, RN from [facility] hospice called updating that patient is declining xray to Left arm/elbow.   Dr. Jana Hakim notified - orders for Gabapentin 100mg  in AM, 300mg  QHS given.  RN notified Mickel Baas, RN.   Updated prescriptions sent to pharmacy.

## 2020-06-02 ENCOUNTER — Other Ambulatory Visit: Payer: Self-pay | Admitting: Oncology

## 2020-06-02 MED ORDER — FENTANYL 50 MCG/HR TD PT72
1.0000 | MEDICATED_PATCH | TRANSDERMAL | 0 refills | Status: DC
Start: 2020-06-02 — End: 2020-07-01

## 2020-06-08 NOTE — Progress Notes (Signed)
[place]  [phone] [phone]     ID: Samantha Lozano DOB: [birth year]  MR#: [id number]  XHB#:[id number]  Patient Care Team: Orpah Melter, MD as PCP - General (Family Medicine) Mikal Blasdell, Virgie Dad, MD as Consulting Physician (Oncology) Chauncey Cruel, MD OTHER MD:  I connected with Samantha Lozano on 06/09/20 at 12:30 PM EDT by video enabled telemedicine visit and verified that I am speaking with the correct person using two identifiers.   I discussed the limitations, risks, security and privacy concerns of performing an evaluation and management service by telemedicine and the availability of in-person appointments. I also discussed with the patient that there may be a patient responsible charge related to this service. The patient expressed understanding and agreed to proceed.   Other persons participating in the visit and their role in the encounter: Daughter-in-law Samantha Lozano  Patient's location: home  Provider's location: [place]    CHIEF COMPLAINT: metastatic cancer  CURRENT TREATMENT: Comfort care under hospice   INTERVAL HISTORY: Samantha Lozano was contacted today for follow-up of her metastatic cancer. She is now under the care of hospice, for comfort care.    She is doing remarkably well. Pain is well-controlled on fentanyl 50 mcg/h patch + morphine BID + tylenol BID. Constipation is controlled on miralax and stool softeners. The right leg swelling persists but is no worse and she is able to ambulate with a walker, sometimes outdoors. She has had no falls  REVIEW OF SYSTEMS: Samantha Lozano tells me her hair is thinning ut. Taste is not normal but she is eating better and they went out to eat once and she enjoyed that. Asked if there was anything we neeed to fix today she said no, everything was fine.   HISTORY OF CURRENT ILLNESS: From the original intake note:  Samantha Lozano presented to the ED on 01/10/2020 with  right-sided chest pain for one week. She described it as intermittent sharp pain, worse with deep breathing and certain movements, located near the right breast. She underwent chest CT that day showing: innumerable bilateral pulmonary nodules concerning for metastatic disease; multiple lytic lesions involving bilateral ribs, proximal right humerus, and thoracic spine; probable nondisplaced pathologic fracture involving right lateral 5th rib; no acute cardiopulmonary process; no acute abnormality identified within upper abdomen.  She had bilateral screening mammography at [facility] on 05/26/2019 showing the breast density to be category C.  There was a 1.4 cm group of calcifications in the lower medial quadrant of the right breast which appeared changed.  She was brought back on 05/29/2019 for unilateral diagnostic right mammography which showed only benign scattered calcifications and benign vascular calcifications in the right breast.  There was no evidence of malignancy.  The patient's subsequent history is as detailed below.   PAST MEDICAL HISTORY: Past Medical History:  Diagnosis Date  . Diabetes mellitus, type 2 ([place])   . HLD (hyperlipidemia)   . HTN (hypertension)   . Leg edema   . Other specified disorders of thyroid     PAST SURGICAL HISTORY: Past Surgical History:  Procedure Laterality Date  . ABDOMINAL HYSTERECTOMY    . THYROID SURGERY      FAMILY HISTORY: Family History  Problem Relation Age of Onset  . Diabetes Mother   . Hypertension Mother   . Cancer Mother   . Obesity Mother   . Hypertension Father   . Cancer Father   . Obesity Father   The patient's father died at  age 79 with cancer but the patient does not know what type.  He had many siblings and many of them had cancer but she does not know what types they may have been.  The patient's mother died at age 44 from what sounds like an anaplastic lymphoma.  The patient herself had 7 siblings.  One half sister died from  ovarian cancer in her 2s.  1 brother died from pancreatic cancer age 63.   GYNECOLOGIC HISTORY:  No LMP recorded. Patient has had a hysterectomy. Menarche: 74 years old Age at first live birth: 74 years old GX P 1 LMP 68s? HRT no  Hysterectomy? Yes, 09/2007, benign pathology BSO? 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  She is widowed, lives by herself, with no pets.  Son Samantha Lozano runs a water purification program.  His wife, Samantha Lozano, works in the [place] at [employer] long.  The patient is not a church attender.    ADVANCED DIRECTIVES: Son Samantha Lozano and daughter-in-law Samantha Lozano are jointly healthcare powers of attorney   HEALTH MAINTENANCE: Social History   Tobacco Use  . Smoking status: Never Smoker  . Smokeless tobacco: Never Used  Substance Use Topics  . Alcohol use: Not Currently  . Drug use: Not Currently     Colonoscopy: 03/2019  PAP: 06/2013, negative  Bone density:    Allergies  Allergen Reactions  . Lisinopril Cough    Current Outpatient Medications  Medication Sig Dispense Refill  . acetaminophen (TYLENOL) 500 MG tablet Take 1 tablet (500 mg total) by mouth in the morning, at noon, and at bedtime. Take with aleve 220 mg 120 tablet 0  . aspirin EC 81 MG tablet Take 81 mg by mouth daily.    . bisacodyl (DULCOLAX) 10 MG suppository Place 1 suppository (10 mg total) rectally daily as needed for moderate constipation. 12 suppository 0  . carvedilol (COREG) 25 MG tablet Take 25 mg by mouth 2 (two) times daily with a meal.    . fentaNYL (DURAGESIC) 50 MCG/HR Place 1 patch onto the skin every 3 (three) days. 10 patch 0  . furosemide (LASIX) 20 MG tablet TAKE 1 TABLET BY MOUTH DAILY MAY REPEAT 1 TABLET DAILY AS NEEDED. 30 tablet 0  . gabapentin (NEURONTIN) 100 MG capsule Take 1 capsule (100 mg total) by mouth daily. 90 capsule 0  . gabapentin (NEURONTIN) 300 MG capsule Take 1  capsule (300 mg total) by mouth at bedtime. 120 capsule 4  . glucose blood test strip Check BS twice daily 100 each 0  . hydrochlorothiazide (HYDRODIURIL) 25 MG tablet Take 25 mg by mouth daily.    [name] levothyroxine (SYNTHROID, LEVOTHROID) 125 MCG tablet Take 125 mcg by mouth daily before breakfast.    . LORazepam (ATIVAN) 2 MG/ML concentrated solution Take 0.3 mLs (0.6 mg total) by mouth every 6 (six) hours as needed for anxiety. 30 mL 0  . losartan (COZAAR) 25 MG tablet TAKE 1 TABLET BY MOUTH EVERY DAY 30 tablet 1  . lovastatin (MEVACOR) 40 MG tablet Take 40 mg by mouth at bedtime.    . metFORMIN (GLUCOPHAGE) 850 MG tablet Take 1 tablet (850 mg total) by mouth daily. with food 30 tablet 0  . methocarbamol (ROBAXIN) 500 MG tablet Take 2 tablets (1,000 mg total) by mouth 4 (four) times daily. 240 tablet 0  . morphine (MSIR) 30 MG tablet Take 1 tablet (30 mg total) by mouth  every 3 (three) hours as needed for severe pain. 120 tablet 0  . Omega-3 Fatty Acids (FISH OIL) 435 MG CAPS Take 435 mg by mouth daily at 2 PM.     . ondansetron (ZOFRAN ODT) 8 MG disintegrating tablet Take 1 tablet (8 mg total) by mouth every 8 (eight) hours as needed for nausea or vomiting (PRN 3 times daily dissolve under the tongue). 20 tablet 0  . polyethylene glycol (MIRALAX / GLYCOLAX) 17 g packet Take 17 g by mouth 2 (two) times daily. 60 each 1  . prochlorperazine (COMPAZINE) 10 MG tablet Take 1 tablet (10 mg total) by mouth every 6 (six) hours as needed for nausea or vomiting. 60 tablet 0  . promethazine (PHENERGAN) 25 MG suppository Place 1 suppository (25 mg total) rectally every 6 (six) hours as needed for nausea or vomiting. 12 each 0  . promethazine (PHENERGAN) 25 MG suppository Place 1 suppository (25 mg total) rectally every 6 (six) hours as needed for nausea or vomiting. 12 each 0  . senna (SENOKOT) 8.6 MG TABS tablet Take 2 tablets (17.2 mg total) by mouth 2 (two) times daily. 120 tablet 1  . venlafaxine XR  (EFFEXOR-XR) 75 MG 24 hr capsule TAKE 1 CAPSULE (75 MG TOTAL) BY MOUTH DAILY WITH BREAKFAST. 90 capsule 4   No current facility-administered medications for this visit.    OBJECTIVE:   There were no vitals filed for this visit. Wt Readings from Last 3 Encounters:  03/25/20 211 lb 14.4 oz (96.1 kg)  03/03/20 220 lb (99.8 kg)  02/28/20 224 lb 10.4 oz (101.9 kg)   There is no height or weight on file to calculate BMI.    ECOG FS:2 - Symptomatic, <50% confined to bed  Telemedicine visit 06/09/2020  LAB RESULTS:  CMP     Component Value Date/Time   NA 137 03/25/2020 0806   NA 141 11/27/2018 1027   K 3.5 03/25/2020 0806   CL 96 (L) 03/25/2020 0806   CO2 31 03/25/2020 0806   GLUCOSE 176 (H) 03/25/2020 0806   BUN 10 03/25/2020 0806   BUN 19 11/27/2018 1027   CREATININE 0.79 03/25/2020 0806   CALCIUM 9.6 03/25/2020 0806   PROT 7.1 03/25/2020 0806   PROT 7.5 11/27/2018 1027   ALBUMIN 3.6 03/25/2020 0806   ALBUMIN 4.6 11/27/2018 1027   AST 21 03/25/2020 0806   ALT 22 03/25/2020 0806   ALKPHOS 85 03/25/2020 0806   BILITOT 0.6 03/25/2020 0806   GFRNONAA >60 03/25/2020 0806   GFRAA >60 03/25/2020 0806    No results found for: TOTALPROTELP, ALBUMINELP, A1GS, A2GS, BETS, BETA2SER, GAMS, MSPIKE, SPEI  Lab Results  Component Value Date   WBC 3.9 (L) 03/25/2020   NEUTROABS 2.7 03/25/2020   HGB 12.7 03/25/2020   HCT 37.2 03/25/2020   MCV 82.3 03/25/2020   PLT 117 (L) 03/25/2020    No results found for: LABCA2  No components found for: NOBSJG283  No results for input(s): INR in the last 168 hours.  No results found for: LABCA2  Lab Results  Component Value Date   MOQ947 14 01/13/2020    No results found for: MLY650  No results found for: PTW656  Lab Results  Component Value Date   CA2729 27.5 01/13/2020    No components found for: HGQUANT  Lab Results  Component Value Date   CEA1 <1.00 01/13/2020   /  CEA ([place])  Date Value Ref Range Status    01/13/2020 <1.00 0.00 -  5.00 ng/mL Final    Comment:    (NOTE) This test was performed using Architect's Chemiluminescent Microparticle Immunoassay. Values obtained from different assay methods cannot be used interchangeably. Please note that 5-10% of patients who smoke may see CEA levels up to 6.9 ng/mL. Performed at [facility], [place] [address] [place], [place] [address]      No results found for: AFPTUMOR  No results found for: CHROMOGRNA  No results found for: KPAFRELGTCHN, LAMBDASER, KAPLAMBRATIO (kappa/lambda light chains)  No results found for: HGBA, HGBA2QUANT, HGBFQUANT, HGBSQUAN (Hemoglobinopathy evaluation)   Lab Results  Component Value Date   LDH 223 (H) 01/13/2020    Lab Results  Component Value Date   IRON 48 01/13/2020   TIBC 321 01/13/2020   IRONPCTSAT 15 (L) 01/13/2020   (Iron and TIBC)  Lab Results  Component Value Date   FERRITIN 63 01/13/2020    Urinalysis    Component Value Date/Time   COLORURINE YELLOW 03/03/2020 1842   APPEARANCEUR CLEAR 03/03/2020 1842   LABSPEC 1.020 03/03/2020 1842   PHURINE 5.0 03/03/2020 1842   GLUCOSEU NEGATIVE 03/03/2020 1842   HGBUR SMALL (A) 03/03/2020 1842   BILIRUBINUR NEGATIVE 03/03/2020 1842   KETONESUR NEGATIVE 03/03/2020 1842   PROTEINUR NEGATIVE 03/03/2020 1842   NITRITE NEGATIVE 03/03/2020 1842   LEUKOCYTESUR TRACE (A) 03/03/2020 1842    STUDIES: No results found.   ELIGIBLE FOR AVAILABLE RESEARCH PROTOCOL: no  ASSESSMENT: 74 y.o. [facility] woman presenting 01/10/2020 with pleuritic chest pain, chest CT scan same day showing innumerable bilateral pulmonary nodules, largest in the superior left lower lobe measuring 2.5 cm largest on the right side in the lower lobe at 1.9 cm.  Scan also showed lytic lesions at T12, multiple ribs, and right humeral head  (a) CEA, CA 19-9, CA 27-29 all within normal limits 01/13/2020  (b) bone marrow biopsy 01/15/2020 nondiagnostic  (c)  right breast mammography and ultrasonography led to biopsy [id number], all benign  (d) head CT with and without contrast 01/21/2020 no evidence of brain metastases  (e) PET scan 02/05/2020 shows multiple lung lesions, a soft tissue mass in the lower left back adjacent to the 12th rib, no abnormal liver activity, no hypermetabolic activity in the colon, pancreas, spleen, or adrenals, expansile lytic lesion in the medial right iliac bone measuring 3.5 cm with SUV of 4.0 and a smaller lytic lesion in the right iliac bone adjacent to the acetabulum measuring 1.5 cm with an SUV max of 4.  (f) CT biopsy obtained 02/17/2020 from the paraspinal mass shows ([id number] [phone]) spindle cell malignancy consistent with leiomyosarcoma, low-grade, stage IV (0 necrosis, 1 differentiation, 2 mitotic rate = 3)  (1) palliative radiation 03/02/20 - 03/21/20 Site/dose:   1. The right pelvis was treated to a dose of 30 Gy in 10 fractions using an isodose plan. This consisted of 5 fields. 2. The right femur was treated to a dose of 30 Gy in 10 fractions using an isodose plan. This consisted of 2 fields. The T-spine was treated to a dose of 30 Gy in 10 fractions using an isodose plan. This consisted of 3 fields. to T12, right iliac region, and right proximal femur started 03/02/2020  (2) liposomal doxorubicin/Doxil to start 03/31/2020, patient opted against   (3) supportive care: pain management (updated 06/09/2020)  (a) tylenol 500 mg BID PRN  (b) currently on fentanyl patch, 50 mcg/h  (c) MSIR 30 mg BID PRN  (d) bowel prophylaxis in place  PLAN: Samantha Lozano is now just about 4 months out from definitive diagnosis of her metastatic sarcoma. Thanks to her family and hospice her symptoms are well-controlled and she is moderately functional. I don't think she would be any better at this point if she had received doxil--likely she would be feeling worse--and I am glad she is having some time.  At this point I cannot make her  functional status better. I will reschedule a virtual visit in 4 weeks (instead of 2). They know to call if any urgency occurs before then  Samantha Jews C. Kelie Gainey, MD 06/09/2020 12:39 PM Medical Oncology and Hematology [facility] [place], [place] [address] Tel. [phone]    Fax. [phone]   I, Wilburn Mylar, am acting as scribe for Dr. Virgie Dad. Aila Terra.  I, Lurline Del MD, have reviewed the above documentation for accuracy and completeness, and I agree with the above.   *Total Encounter Time as defined by the Centers for Medicare and Medicaid Services includes, in addition to the face-to-face time of a patient visit (documented in the note above) non-face-to-face time: obtaining and reviewing outside history, ordering and reviewing medications, tests or procedures, care coordination (communications with other health care professionals or caregivers) and documentation in the medical record.

## 2020-06-09 ENCOUNTER — Inpatient Hospital Stay (HOSPITAL_BASED_OUTPATIENT_CLINIC_OR_DEPARTMENT_OTHER): Payer: PPO | Admitting: Oncology

## 2020-06-09 DIAGNOSIS — C801 Malignant (primary) neoplasm, unspecified: Secondary | ICD-10-CM | POA: Diagnosis not present

## 2020-06-09 DIAGNOSIS — M7989 Other specified soft tissue disorders: Secondary | ICD-10-CM | POA: Diagnosis not present

## 2020-06-09 DIAGNOSIS — C7951 Secondary malignant neoplasm of bone: Secondary | ICD-10-CM | POA: Diagnosis not present

## 2020-06-09 DIAGNOSIS — C7800 Secondary malignant neoplasm of unspecified lung: Secondary | ICD-10-CM | POA: Diagnosis not present

## 2020-06-09 DIAGNOSIS — Z7189 Other specified counseling: Secondary | ICD-10-CM | POA: Diagnosis not present

## 2020-06-09 DIAGNOSIS — Z79899 Other long term (current) drug therapy: Secondary | ICD-10-CM | POA: Diagnosis not present

## 2020-06-09 DIAGNOSIS — C499 Malignant neoplasm of connective and soft tissue, unspecified: Secondary | ICD-10-CM

## 2020-06-09 DIAGNOSIS — Z515 Encounter for palliative care: Secondary | ICD-10-CM | POA: Diagnosis not present

## 2020-06-09 DIAGNOSIS — G893 Neoplasm related pain (acute) (chronic): Secondary | ICD-10-CM | POA: Diagnosis not present

## 2020-06-13 ENCOUNTER — Other Ambulatory Visit: Payer: Self-pay | Admitting: Oncology

## 2020-06-16 ENCOUNTER — Other Ambulatory Visit: Payer: Self-pay | Admitting: Oncology

## 2020-06-21 ENCOUNTER — Telehealth: Payer: Self-pay

## 2020-06-21 NOTE — Telephone Encounter (Signed)
Maura, RN from hospice called to give update on pt stating pt had to use comfort kit with Roxanol 5 mg for pain. Cristela Blue states within 7 minutes of having Roxanol pt's pain level was 2/10. Per Dr Jana Hakim, they are to refill Roxanol and give q1-2HR PRN pain. Cristela Blue also states a hospital bed has been ordered for pt, and she has begun to ask about if she is dying. Pt will have a Chaplain come to her home 10/14. Cristela Blue understands to call with any further concerns.

## 2020-06-28 ENCOUNTER — Encounter: Payer: Self-pay | Admitting: Oncology

## 2020-06-30 ENCOUNTER — Other Ambulatory Visit: Payer: Self-pay | Admitting: Oncology

## 2020-07-01 ENCOUNTER — Telehealth: Payer: Self-pay | Admitting: Oncology

## 2020-07-01 ENCOUNTER — Other Ambulatory Visit: Payer: Self-pay | Admitting: Oncology

## 2020-07-01 MED ORDER — GABAPENTIN 300 MG PO CAPS: 300.0000 mg | ORAL_CAPSULE | Freq: Every day | ORAL | 4 refills | Status: AC

## 2020-07-01 MED ORDER — FENTANYL 50 MCG/HR TD PT72
1.0000 | MEDICATED_PATCH | TRANSDERMAL | 0 refills | Status: DC
Start: 2020-07-01 — End: 2020-08-02

## 2020-07-01 MED ORDER — GABAPENTIN 100 MG PO CAPS: 100.0000 mg | ORAL_CAPSULE | Freq: Every day | ORAL | 0 refills | Status: AC

## 2020-07-01 MED ORDER — MORPHINE SULFATE 30 MG PO TABS
30.0000 mg | ORAL_TABLET | ORAL | 0 refills | Status: AC | PRN
Start: 2020-07-01 — End: ?

## 2020-07-01 NOTE — Telephone Encounter (Signed)
Scheduled appointment per 10/22 sch msg. Spoke to patient who is aware of appointment.

## 2020-07-01 NOTE — Progress Notes (Signed)
I called Samantha Lozano today for an update on Samantha Lozano.  For some reason we were supposed to have had a follow-up and did not.  She says [name] is declining.  She does well in the morning but in the afternoon she pretty much gets into bed and does not get out.  She does get up to use the bathroom still with assistance.  We switched to a liquid morphine and Samantha Lozano wonders if that is the reason [name] is more sleepy or if she is just declining.  We reviewed the fact that [name] is welcome to take the oral morphine pills so long as she can swallow without difficulty, which she still can.  Of course she is also on the fentanyl patch.  The liquid morphine really is for people who cannot swallow and is good to have around but otherwise there is no need to make that change.  We discussed what is likely to be happening over the next few days and weeks and how [name] at some point will not be able to get out of bed and may need a bedside commode or a Foley or both.  I am putting her in for a virtual visit a week from today.  I am refilling her pain medication.

## 2020-07-08 ENCOUNTER — Inpatient Hospital Stay: Payer: PPO | Attending: Oncology | Admitting: Oncology

## 2020-07-08 DIAGNOSIS — C7951 Secondary malignant neoplasm of bone: Secondary | ICD-10-CM | POA: Diagnosis not present

## 2020-07-08 DIAGNOSIS — C7800 Secondary malignant neoplasm of unspecified lung: Secondary | ICD-10-CM

## 2020-07-08 DIAGNOSIS — C499 Malignant neoplasm of connective and soft tissue, unspecified: Secondary | ICD-10-CM

## 2020-07-08 MED ORDER — SENNA 8.6 MG PO TABS: 2.0000 | ORAL_TABLET | Freq: Two times a day (BID) | ORAL | 6 refills | Status: AC

## 2020-07-08 NOTE — Progress Notes (Signed)
Samantha Lozano  [phone] [phone]     ID: [name] HARE DOB: 1946/02/15  MR#: [id number]  EFE#:[id number]  Patient Care Team: Orpah Melter, MD as PCP - General (Family Medicine) Charisma Charlot, Virgie Dad, MD as Consulting Physician (Oncology) Chauncey Cruel, MD OTHER MD:  I connected with Samantha Lozano on 07/08/20 at 10:15 AM EDT by video enabled telemedicine visit and verified that I am speaking with the correct person using two identifiers.   I discussed the limitations, risks, security and privacy concerns of performing an evaluation and management service by telemedicine and the availability of in-person appointments. I also discussed with the patient that there may be a patient responsible charge related to this service. The patient expressed understanding and agreed to proceed.   Other persons participating in the visit and their role in the encounter: Daughter-in-law [name]  Patient's location: home  Provider's location: [place]    CHIEF COMPLAINT: metastatic cancer  CURRENT TREATMENT: Comfort care under hospice   INTERVAL HISTORY: Samantha Lozano was contacted today for follow-up of her metastatic cancer. She is now under the care of hospice, for comfort care.    She is doing remarkably well. Pain is well-controlled on fentanyl 50 mcg/h patch + morphine BID + tylenol BID. Constipation is controlled on miralax and stool softeners. The right leg swelling persists but is no worse and she is able to ambulate with a walker, sometimes outdoors. She has had no falls   REVIEW OF SYSTEMS: Samantha Lozano tells me she is "okay".  Yesterday she had some friends come over for a lunch T and she said that was a lot of fun.  She says her pain is well controlled on her current medications.  She is not constipated.  They did request some increase in her Senokot which I was glad to provide   COVID 19 VACCINATION STATUS:   HISTORY OF CURRENT  ILLNESS: From the original intake note:  MAYKAYLA HIGHLEY presented to the ED on 01/10/2020 with right-sided chest pain for one week. She described it as intermittent sharp pain, worse with deep breathing and certain movements, located near the right breast. She underwent chest CT that day showing: innumerable bilateral pulmonary nodules concerning for metastatic disease; multiple lytic lesions involving bilateral ribs, proximal right humerus, and thoracic spine; probable nondisplaced pathologic fracture involving right lateral 5th rib; no acute cardiopulmonary process; no acute abnormality identified within upper abdomen.  She had bilateral screening mammography at [facility] on 05/26/2019 showing the breast density to be category C.  There was a 1.4 cm group of calcifications in the lower medial quadrant of the right breast which appeared changed.  She was brought back on 05/29/2019 for unilateral diagnostic right mammography which showed only benign scattered calcifications and benign vascular calcifications in the right breast.  There was no evidence of malignancy.  The patient's subsequent history is as detailed below.   PAST MEDICAL HISTORY: Past Medical History:  Diagnosis Date  . Diabetes mellitus, type 2 ([place])   . HLD (hyperlipidemia)   . HTN (hypertension)   . Leg edema   . Other specified disorders of thyroid     PAST SURGICAL HISTORY: Past Surgical History:  Procedure Laterality Date  . ABDOMINAL HYSTERECTOMY    . THYROID SURGERY      FAMILY HISTORY: Family History  Problem Relation Age of Onset  . Diabetes Mother   . Hypertension Mother   . Cancer Mother   . Obesity Mother   .  Hypertension Father   . Cancer Father   . Obesity Father   The patient's father died at age 33 with cancer but the patient does not know what type.  He had many siblings and many of them had cancer but she does not know what types they may have been.  The patient's mother died at age 47 from what  sounds like an anaplastic lymphoma.  The patient herself had 7 siblings.  One half sister died from ovarian cancer in her 14s.  1 brother died from pancreatic cancer age 55.   GYNECOLOGIC HISTORY:  No LMP recorded. Patient has had a hysterectomy. Menarche: 74 years old Age at first live birth: 74 years old GX P 1 LMP 14s? HRT no  Hysterectomy? Yes, 09/2007, benign pathology BSO? 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  She is widowed, lives by herself, with no pets.  Son [name] runs a water purification program.  His wife, [name], works in the [place] at [employer] long.  The patient is not a church attender.    ADVANCED DIRECTIVES: Son [name] and daughter-in-law [name] are jointly healthcare powers of attorney   HEALTH MAINTENANCE: Social History   Tobacco Use  . Smoking status: Never Smoker  . Smokeless tobacco: Never Used  Substance Use Topics  . Alcohol use: Not Currently  . Drug use: Not Currently     Colonoscopy: 03/2019  PAP: 06/2013, negative  Bone density:    Allergies  Allergen Reactions  . Lisinopril Cough    Current Outpatient Medications  Medication Sig Dispense Refill  . acetaminophen (TYLENOL) 500 MG tablet Take 1 tablet (500 mg total) by mouth in the morning, at noon, and at bedtime. Take with aleve 220 mg 120 tablet 0  . aspirin EC 81 MG tablet Take 81 mg by mouth daily.    . bisacodyl (DULCOLAX) 10 MG suppository Place 1 suppository (10 mg total) rectally daily as needed for moderate constipation. 12 suppository 0  . carvedilol (COREG) 25 MG tablet Take 25 mg by mouth 2 (two) times daily with a meal.    . fentaNYL (DURAGESIC) 50 MCG/HR Place 1 patch onto the skin every 3 (three) days. 10 patch 0  . furosemide (LASIX) 20 MG tablet TAKE 1 TABLET BY MOUTH DAILY MAY REPEAT 1 TABLET DAILY AS NEEDED. 90 tablet 1  . gabapentin (NEURONTIN) 100 MG capsule Take 1 capsule  (100 mg total) by mouth daily. 90 capsule 0  . gabapentin (NEURONTIN) 300 MG capsule Take 1 capsule (300 mg total) by mouth at bedtime. 120 capsule 4  . glucose blood test strip Check BS twice daily 100 each 0  . levothyroxine (SYNTHROID, LEVOTHROID) 125 MCG tablet Take 125 mcg by mouth daily before breakfast.    . LORazepam (ATIVAN) 2 MG/ML concentrated solution Take 0.3 mLs (0.6 mg total) by mouth every 6 (six) hours as needed for anxiety. 30 mL 0  . losartan (COZAAR) 25 MG tablet TAKE 1 TABLET BY MOUTH EVERY DAY 30 tablet 1  . lovastatin (MEVACOR) 40 MG tablet Take 40 mg by mouth at bedtime.    . metFORMIN (GLUCOPHAGE) 850 MG tablet Take 1 tablet (850 mg total) by mouth daily. with food 30 tablet 0  . methocarbamol (ROBAXIN) 500 MG tablet Take 2 tablets (1,000 mg total) by mouth 4 (four) times daily. 240 tablet 0  . morphine (MSIR) 30 MG tablet Take 1 tablet (30  mg total) by mouth every 3 (three) hours as needed for severe pain. 120 tablet 0  . Omega-3 Fatty Acids (FISH OIL) 435 MG CAPS Take 435 mg by mouth daily at 2 PM.     . ondansetron (ZOFRAN ODT) 8 MG disintegrating tablet Take 1 tablet (8 mg total) by mouth every 8 (eight) hours as needed for nausea or vomiting (PRN 3 times daily dissolve under the tongue). 20 tablet 0  . polyethylene glycol (MIRALAX / GLYCOLAX) 17 g packet Take 17 g by mouth 2 (two) times daily. 60 each 1  . prochlorperazine (COMPAZINE) 10 MG tablet Take 1 tablet (10 mg total) by mouth every 6 (six) hours as needed for nausea or vomiting. 60 tablet 0  . promethazine (PHENERGAN) 25 MG suppository Place 1 suppository (25 mg total) rectally every 6 (six) hours as needed for nausea or vomiting. 12 each 0  . promethazine (PHENERGAN) 25 MG suppository Place 1 suppository (25 mg total) rectally every 6 (six) hours as needed for nausea or vomiting. 12 each 0  . senna (SENOKOT) 8.6 MG TABS tablet Take 2 tablets (17.2 mg total) by mouth 2 (two) times daily. 120 tablet 1  . senna  (SENOKOT) 8.6 MG TABS tablet Take 2 tablets (17.2 mg total) by mouth 2 (two) times daily. 120 tablet 6  . venlafaxine XR (EFFEXOR-XR) 75 MG 24 hr capsule TAKE 1 CAPSULE (75 MG TOTAL) BY MOUTH DAILY WITH BREAKFAST. 90 capsule 4   No current facility-administered medications for this visit.    OBJECTIVE: On the video the patient is sitting up, appears alert, very coherent, and seems comfortable  There were no vitals filed for this visit. Wt Readings from Last 3 Encounters:  03/25/20 211 lb 14.4 oz (96.1 kg)  03/03/20 220 lb (99.8 kg)  02/28/20 224 lb 10.4 oz (101.9 kg)   There is no height or weight on file to calculate BMI.    ECOG FS:2 - Symptomatic, <50% confined to bed  Telemedicine visit 07/08/2020  LAB RESULTS:  CMP     Component Value Date/Time   NA 137 03/25/2020 0806   NA 141 11/27/2018 1027   K 3.5 03/25/2020 0806   CL 96 (L) 03/25/2020 0806   CO2 31 03/25/2020 0806   GLUCOSE 176 (H) 03/25/2020 0806   BUN 10 03/25/2020 0806   BUN 19 11/27/2018 1027   CREATININE 0.79 03/25/2020 0806   CALCIUM 9.6 03/25/2020 0806   PROT 7.1 03/25/2020 0806   PROT 7.5 11/27/2018 1027   ALBUMIN 3.6 03/25/2020 0806   ALBUMIN 4.6 11/27/2018 1027   AST 21 03/25/2020 0806   ALT 22 03/25/2020 0806   ALKPHOS 85 03/25/2020 0806   BILITOT 0.6 03/25/2020 0806   GFRNONAA >60 03/25/2020 0806   GFRAA >60 03/25/2020 0806    No results found for: TOTALPROTELP, ALBUMINELP, A1GS, A2GS, BETS, BETA2SER, GAMS, MSPIKE, SPEI  Lab Results  Component Value Date   WBC 3.9 (L) 03/25/2020   NEUTROABS 2.7 03/25/2020   HGB 12.7 03/25/2020   HCT 37.2 03/25/2020   MCV 82.3 03/25/2020   PLT 117 (L) 03/25/2020    No results found for: LABCA2  No components found for: YDXAJO878  No results for input(s): INR in the last 168 hours.  No results found for: LABCA2  Lab Results  Component Value Date   MVE720 14 01/13/2020    No results found for: NOB096  No results found for: GEZ662  Lab  Results  Component Value Date  EK8003 27.5 01/13/2020    No components found for: HGQUANT  Lab Results  Component Value Date   CEA1 <1.00 01/13/2020   /  CEA ([facility])  Date Value Ref Range Status  01/13/2020 <1.00 0.00 - 5.00 ng/mL Final    Comment:    (NOTE) This test was performed using Architect's Chemiluminescent Microparticle Immunoassay. Values obtained from different assay methods cannot be used interchangeably. Please note that 5-10% of patients who smoke may see CEA levels up to 6.9 ng/mL. Performed at [facility], [place] [address], [place], [place] [address]      No results found for: AFPTUMOR  No results found for: CHROMOGRNA  No results found for: KPAFRELGTCHN, LAMBDASER, KAPLAMBRATIO (kappa/lambda light chains)  No results found for: HGBA, HGBA2QUANT, HGBFQUANT, HGBSQUAN (Hemoglobinopathy evaluation)   Lab Results  Component Value Date   LDH 223 (H) 01/13/2020    Lab Results  Component Value Date   IRON 48 01/13/2020   TIBC 321 01/13/2020   IRONPCTSAT 15 (L) 01/13/2020   (Iron and TIBC)  Lab Results  Component Value Date   FERRITIN 63 01/13/2020    Urinalysis    Component Value Date/Time   COLORURINE YELLOW 03/03/2020 1842   APPEARANCEUR CLEAR 03/03/2020 1842   LABSPEC 1.020 03/03/2020 1842   PHURINE 5.0 03/03/2020 1842   GLUCOSEU NEGATIVE 03/03/2020 1842   HGBUR SMALL (A) 03/03/2020 1842   BILIRUBINUR NEGATIVE 03/03/2020 1842   KETONESUR NEGATIVE 03/03/2020 1842   PROTEINUR NEGATIVE 03/03/2020 1842   NITRITE NEGATIVE 03/03/2020 1842   LEUKOCYTESUR TRACE (A) 03/03/2020 1842    STUDIES: No results found.   ELIGIBLE FOR AVAILABLE RESEARCH PROTOCOL: no  ASSESSMENT: 74 [facility] woman presenting 01/10/2020 with pleuritic chest pain, chest CT scan same day showing innumerable bilateral pulmonary nodules, largest in the superior left lower lobe measuring 2.5 cm largest on the right side in the lower  lobe at 1.9 cm.  Scan also showed lytic lesions at T12, multiple ribs, and right humeral head  (a) CEA, CA 19-9, CA 27-29 all within normal limits 01/13/2020  (b) bone marrow biopsy 01/15/2020 nondiagnostic  (c) right breast mammography and ultrasonography led to biopsy [id number], all benign  (d) head CT with and without contrast 01/21/2020 no evidence of brain metastases  (e) PET scan 02/05/2020 shows multiple lung lesions, a soft tissue mass in the lower left back adjacent to the 12th rib, no abnormal liver activity, no hypermetabolic activity in the colon, pancreas, spleen, or adrenals, expansile lytic lesion in the medial right iliac bone measuring 3.5 cm with SUV of 4.0 and a smaller lytic lesion in the right iliac bone adjacent to the acetabulum measuring 1.5 cm with an SUV max of 4.  (f) CT biopsy obtained 02/17/2020 from the paraspinal mass shows ([id number] [phone]) spindle cell malignancy consistent with leiomyosarcoma, low-grade, stage IV (0 necrosis, 1 differentiation, 2 mitotic rate = 3)  (1) palliative radiation 03/02/20 - 03/21/20 Site/dose:   1. The right pelvis was treated to a dose of 30 Gy in 10 fractions using an isodose plan. This consisted of 5 fields. 2. The right femur was treated to a dose of 30 Gy in 10 fractions using an isodose plan. This consisted of 2 fields. The T-spine was treated to a dose of 30 Gy in 10 fractions using an isodose plan. This consisted of 3 fields. to T12, right iliac region, and right proximal femur started 03/02/2020  (2) liposomal doxorubicin/Doxil to start 03/31/2020,  patient opted against   (3) supportive care: pain management (updated 06/09/2020)  (a) tylenol 500 mg BID PRN  (b) currently on fentanyl patch, 50 mcg/h  (c) MSIR 30 mg BID PRN  (d) bowel prophylaxis in place   PLAN: Samantha Lozano is coming up on 5 months from definitive diagnosis of her sarcoma.  She continues to receive supportive care at home with hospice.  Her daughter-in-law [name]  really runs the show and is doing a great job of making sure the patient is comfortable safe and as functional as possible  From the nurses report a couple of weeks ago it sounded like vertigo was really going downhill but she seems to have rallied and now is back to the way she was about a month ago  I will see her again in 4 weeks but of course I will be glad to see her anytime before that on an as-needed basis   Samantha Lozano C. Shunda Rabadi, MD 07/08/2020 10:34 AM Medical Oncology and Hematology [facility] [place], [place] [address] Tel. [phone]    Fax. [phone]   I, Wilburn Mylar, am acting as scribe for Dr. Virgie Dad. Rosielee Corporan.  I, Lurline Del MD, have reviewed the above documentation for accuracy and completeness, and I agree with the above.   *Total Encounter Time as defined by the Centers for Medicare and Medicaid Services includes, in addition to the face-to-face time of a patient visit (documented in the note above) non-face-to-face time: obtaining and reviewing outside history, ordering and reviewing medications, tests or procedures, care coordination (communications with other health care professionals or caregivers) and documentation in the medical record.

## 2020-07-18 ENCOUNTER — Inpatient Hospital Stay: Attending: Oncology

## 2020-07-18 ENCOUNTER — Telehealth: Payer: Self-pay

## 2020-07-18 DIAGNOSIS — R8290 Unspecified abnormal findings in urine: Secondary | ICD-10-CM

## 2020-07-18 DIAGNOSIS — R39198 Other difficulties with micturition: Secondary | ICD-10-CM | POA: Insufficient documentation

## 2020-07-18 DIAGNOSIS — R1110 Vomiting, unspecified: Secondary | ICD-10-CM | POA: Diagnosis not present

## 2020-07-18 DIAGNOSIS — R509 Fever, unspecified: Secondary | ICD-10-CM | POA: Insufficient documentation

## 2020-07-18 DIAGNOSIS — C3490 Malignant neoplasm of unspecified part of unspecified bronchus or lung: Secondary | ICD-10-CM | POA: Diagnosis not present

## 2020-07-18 LAB — URINALYSIS, COMPLETE (UACMP) WITH MICROSCOPIC
Bilirubin Urine: NEGATIVE
Glucose, UA: >=500 mg/dL — AB
Ketones, ur: NEGATIVE mg/dL
Nitrite: NEGATIVE
Protein, ur: 100 mg/dL — AB
RBC / HPF: >50 RBC/hpf — ABNORMAL HIGH (ref 0–5)
Specific Gravity, Urine: 1.009 (ref 1.005–1.030)
WBC, UA: >50 WBC/hpf — ABNORMAL HIGH (ref 0–5)
pH: 7 (ref 5.0–8.0)

## 2020-07-18 MED ORDER — NITROFURANTOIN MONOHYD MACRO 100 MG PO CAPS: 100.0000 mg | ORAL_CAPSULE | Freq: Every day | ORAL | 0 refills | Status: AC

## 2020-07-18 NOTE — Telephone Encounter (Signed)
Leandro Reasoner, RN from [employer] [employer] called to report patient is experiencing new symptoms.  Pt is having low grade temp., odor with voiding, loose stools and minimal amount of vomiting.    Leandro Reasoner, RN is collecting urine sample from foley cath, sterile technique being used.   RN notified MD - MD gave verbal orders for urine sample to be ran in lab, orders placed.  Prophylactic antibiotic - Macrobid 100mg  daily PO X 5 days.    Leandro Reasoner, RN notified.

## 2020-07-20 ENCOUNTER — Other Ambulatory Visit: Payer: Self-pay

## 2020-07-20 ENCOUNTER — Other Ambulatory Visit: Payer: Self-pay | Admitting: Oncology

## 2020-07-20 MED ORDER — CIPROFLOXACIN HCL 500 MG PO TABS: 500.0000 mg | ORAL_TABLET | Freq: Two times a day (BID) | ORAL | 0 refills | Status: AC

## 2020-07-20 NOTE — Telephone Encounter (Signed)
Spoke with pt's DIL, [name] to inform her that pt has UTI and asked how pt it using the restroom. [name] states pt has a foley catheter and theey do the best they can when pt has a BM. This LPN asked [name] if catheter care is being done, and [place] asked, "what's that?". This LPN educated [name] on catheter care and how to properly person perineal care with a catheter in place. [name] verbalized understanding, and understands directions given for Cipro 500 mg 1 tab PO BID X 7 days.

## 2020-07-21 ENCOUNTER — Other Ambulatory Visit: Payer: Self-pay | Admitting: Oncology

## 2020-07-21 ENCOUNTER — Other Ambulatory Visit: Payer: Self-pay | Admitting: *Deleted

## 2020-07-21 LAB — URINE CULTURE: Culture: >=100000 — AB

## 2020-07-21 NOTE — Telephone Encounter (Signed)
Patient was prescribed Macrobid for UTI on Monday- this will cover the infection. Cipro cancelled

## 2020-07-26 ENCOUNTER — Telehealth: Payer: Self-pay

## 2020-07-26 NOTE — Telephone Encounter (Signed)
Maura, RN with [facility] called and LVM with report on pt and states pt has increased vomiting, and is now taking Compazine, Zofran, Phenergan and Ativan which is helping. Pt is experiencing decreased bowel sounds LLQ and has clay-like stool, and she has called in dulcolax suppository for pt and recommended bowel rest with a slow increase back to solids and states pt is eating for comfort. Family has hired Building services engineer to help with ADLs. Dr Jana Hakim made aware.

## 2020-07-29 ENCOUNTER — Other Ambulatory Visit: Payer: Self-pay | Admitting: Oncology

## 2020-07-29 ENCOUNTER — Telehealth: Payer: Self-pay | Admitting: *Deleted

## 2020-07-29 NOTE — Telephone Encounter (Signed)
This RN returned call to Add[place] with [facility] regarding need for pain control management.  Pt is currently taking Roxanol 0.25 ml = 5 mg every 4 hours-as needed.   Katelyn states pt is requesting for more frequent dosing ( pain is well controlled with onset of giving) vs increasing the dose.  Per MD above is appropriate as well as MD inquired about current use of the fentanyl patches and if dose needs to be increased.  Katelyn states pt is not using the patches " the family says they stopped everything except the morphine and the ativan because the patient was nauseated and constipated.  This RN discussed above and need for Katelyn to review with pt and family of possible benefit with the patches for better pain control not requiring such frequent dosing - with less break through pain.  Katelyn verbalized understanding.

## 2020-08-01 ENCOUNTER — Telehealth: Payer: Self-pay

## 2020-08-01 NOTE — Telephone Encounter (Signed)
Maura, RN with hospice called requesting Fentanyl patch refill for pt and states she expects pt may pass in 7-10 days. Request for Fentanyl given to Dr Jana Hakim.

## 2020-08-02 ENCOUNTER — Other Ambulatory Visit: Payer: Self-pay | Admitting: Oncology

## 2020-08-02 MED ORDER — FENTANYL 50 MCG/HR TD PT72
1.0000 | MEDICATED_PATCH | TRANSDERMAL | 0 refills | Status: AC
Start: 2020-08-02 — End: ?

## 2020-08-02 NOTE — Progress Notes (Signed)
I called Jennifer's number and spoke with the patient's son [name].  He tells me that Samantha Lozano really is having a hard time breathing and the only thing that helps is morphine and lorazepam.  She is also on fentanyl and they are about to run out so I need to refill that.  They do have 2 ladies helping plus the hospice nurse who came by yesterday to make sure that everything they needed before the holidays they understand that it at this point Samantha Lozano is declining rapidly

## 2020-08-20 ENCOUNTER — Other Ambulatory Visit: Payer: Self-pay | Admitting: Oncology

## 2020-08-24 ENCOUNTER — Other Ambulatory Visit: Payer: Self-pay | Admitting: Oncology

## 2020-08-24 NOTE — Progress Notes (Unsigned)
74 y.o.

## 2020-09-10 DEATH — deceased

## 2020-11-24 IMAGING — PT NM PET TUM IMG INITIAL (PI) SKULL BASE T - THIGH
1 series · 11 of 11 positions shown · non-contrast
Comparison: Chest CT 220

CLINICAL DATA: Initial treatment strategy for carcinoma unknown
primary. COVID vaccine LEFT arm

EXAM:
NUCLEAR MEDICINE PET SKULL BASE TO THIGH
TECHNIQUE: 10.2 mCi F-18 FDG was injected intravenously. Full-ring PET imaging
was performed from the skull base to thigh after the radiotracer. CT
data was obtained and used for attenuation correction and anatomic
localization.
Portion the dose infiltrated in the RIGHT antecubital fossa. While
there is intense activity in the arm, there is adequate radiotracer
activity throughout the body to perform a diagnostic scan.
Fasting blood glucose: 148 mg/dl

[Series 1076: results mm oncology reading · 1.0mm · 1.01mm/px · 11 of 11 slices shown]
[im 1/11]
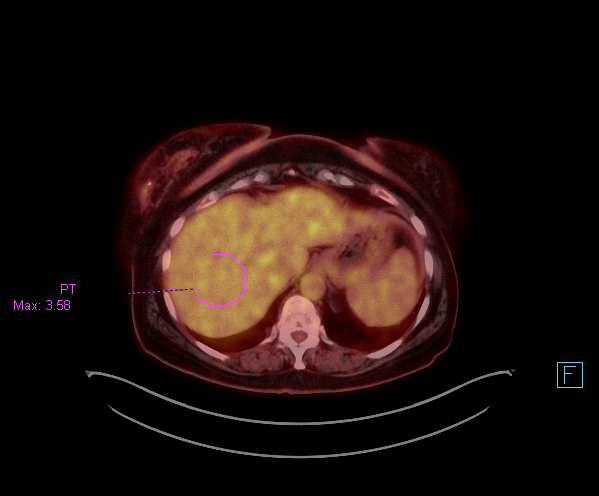
[im 2/11]
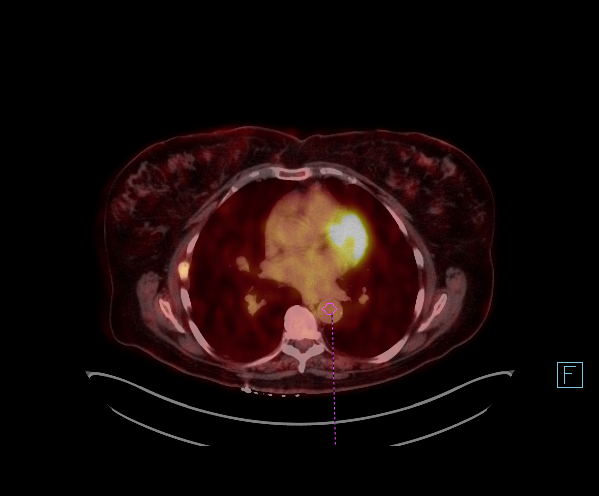
[im 3/11]
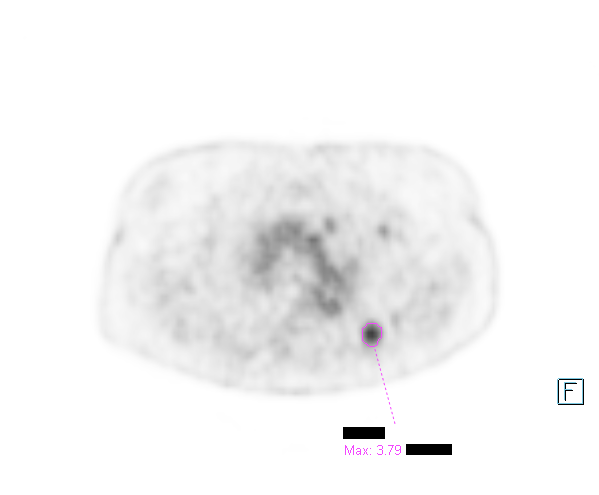
[im 4/11]
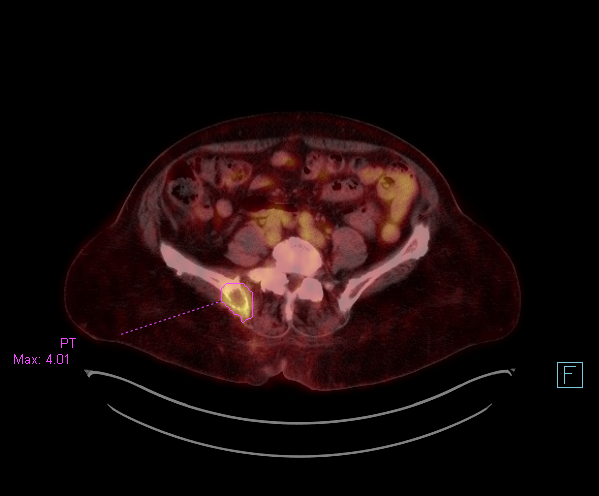
[im 5/11]
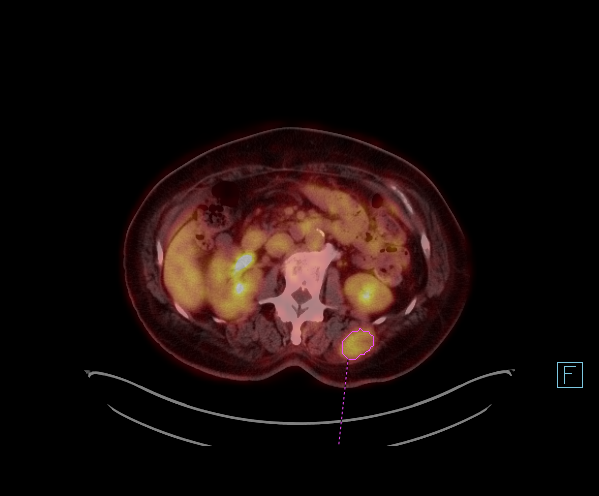
[im 6/11]
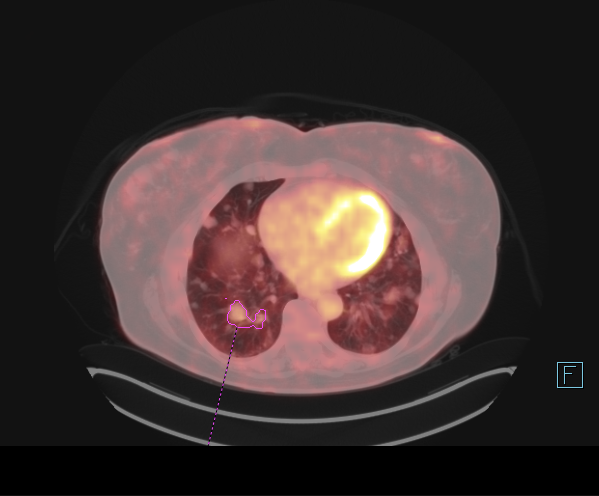
[im 7/11]
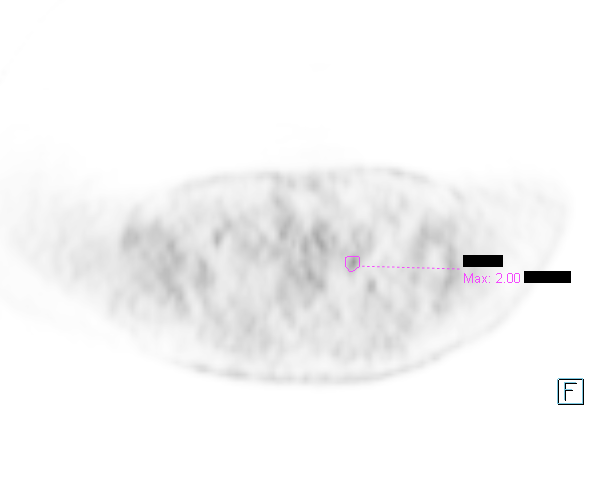
[im 8/11]
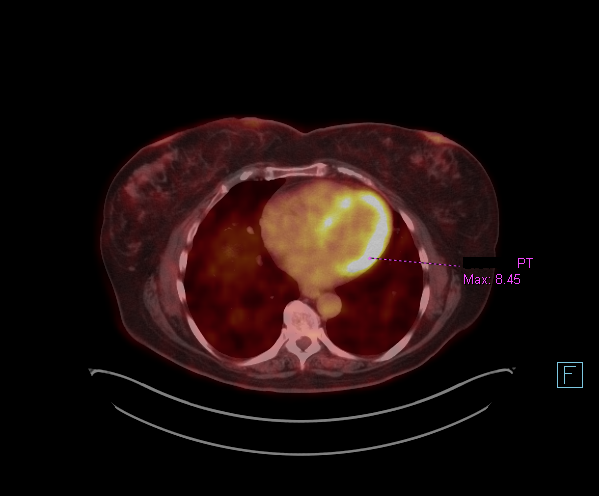
[im 9/11]
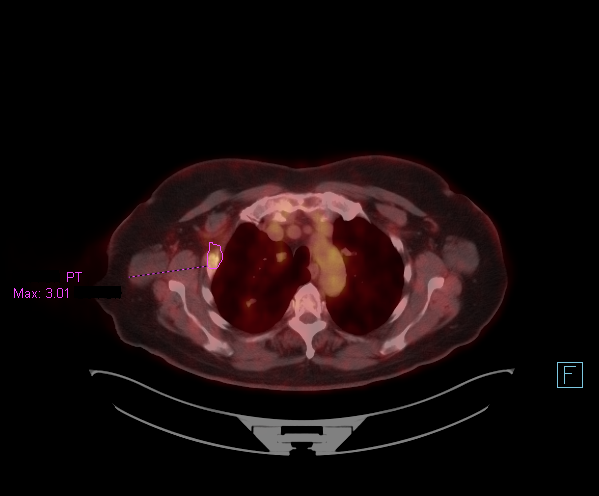
[im 10/11]
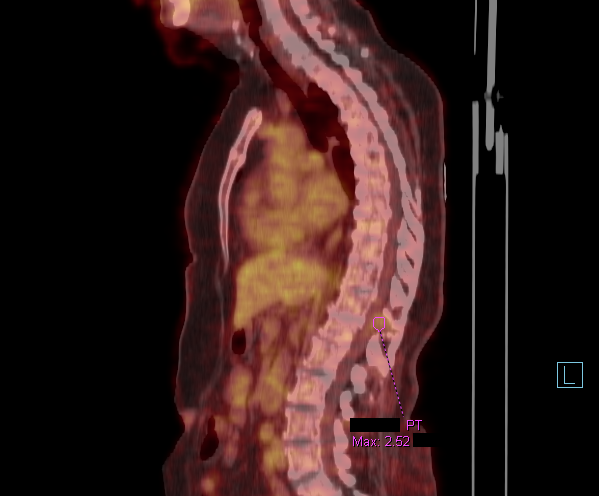
[im 11/11]
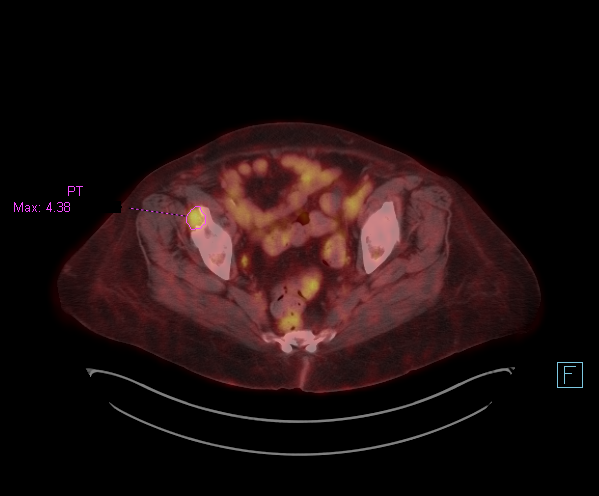

[11 of 11 positions shown; findings below may reference images not displayed]

FINDINGS: Mediastinal blood pool activity: SUV max

Liver activity: SUV max

NECK: No hypermetabolic lymph nodes in the neck.

Incidental CT findings: none

CHEST: Multiple round pulmonary nodules of varying size. These
lesions of mixed metabolic activity. Some lesion without activity.
Several lesions do have metabolic activity albeit moderate for size.
For example, 1.8 cm lesion in the LEFT upper lobe (image 24) with
SUV max equal 3.7. 1.7 cm nodule RIGHT lower lobe SUV max equal 2.5.

These the several small nodules with activity. For example LEFT
upper lobe nodule measuring 0.9 cm with SUV max

Within the paraspinal musculature of the lower LEFT back (adjacent
to the LEFT twelfth rib), there is soft tissue mass measuring 4.0 cm
(image 120/4) with moderate metabolic activity (SUV max equal 3.9.

Incidental CT findings: none

ABDOMEN/PELVIS: No abnormal activity in liver. Adrenal glands are
normal. No hypermetabolic activity associated with colon
reproductive organs. Pancreas normal. Spleen normal.

Incidental CT findings: Post hysterectomy.  Adnexa unremarkable.

SKELETON: There is expansile lytic lesion within the medial RIGHT
iliac bone measuring 3.5 cm (image 146/4) with SUV max equal 4.0.

The small lytic lesion in the RIGHT iliac bone adjacent to the
acetabulum. The soft tissue component measuring 1.5 cm SUV max equal
4.

Mild radiotracer activity associated with a RIGHT lateral rib lesion
with SUV max equal 3.0 on image 58.

Incidental CT findings: Infiltration of radiotracer in the RIGHT arm
IMPRESSION: 1. Unusual pattern of metastatic disease with multiple organ systems
involved (pulmonary metastasis, bone metastasis, and muscle
metastasis) and relatively moderate metabolic activity. Findings
suggest a malignancy with relatively low FDG avidity such as thyroid
carcinoma, renal cell carcinoma or myeloma.
2. Biopsy target sites could include the hypermetabolic soft tissue
mass in the LEFT paraspinal musculature or one of the two
hypermetabolic lytic lesions in the RIGHT iliac bone.
3. RIGHT arm radiotracer infiltration.

## 2022-11-27 ENCOUNTER — Encounter: Payer: Self-pay | Admitting: Oncology
